# Patient Record
Sex: Female | Born: 1955 | Race: Black or African American | Hispanic: No | Marital: Married | Smoking: Current every day smoker
Health system: Southern US, Community
[De-identification: ages and names within clinical notes are randomized; demographics above are authoritative.]

## PROBLEM LIST (undated history)

## (undated) DIAGNOSIS — A048 Other specified bacterial intestinal infections: Secondary | ICD-10-CM

## (undated) DIAGNOSIS — E079 Disorder of thyroid, unspecified: Secondary | ICD-10-CM

## (undated) DIAGNOSIS — E785 Hyperlipidemia, unspecified: Secondary | ICD-10-CM

## (undated) DIAGNOSIS — R112 Nausea with vomiting, unspecified: Secondary | ICD-10-CM

## (undated) DIAGNOSIS — I2510 Atherosclerotic heart disease of native coronary artery without angina pectoris: Secondary | ICD-10-CM

## (undated) DIAGNOSIS — I10 Essential (primary) hypertension: Secondary | ICD-10-CM

## (undated) DIAGNOSIS — E039 Hypothyroidism, unspecified: Secondary | ICD-10-CM

## (undated) HISTORY — DX: Atherosclerotic heart disease of native coronary artery without angina pectoris: I25.10

## (undated) HISTORY — DX: Other specified bacterial intestinal infections: A04.8

## (undated) HISTORY — DX: Hyperlipidemia, unspecified: E78.5

## (undated) HISTORY — PX: THYROIDECTOMY: SHX17

## (undated) HISTORY — DX: Nausea with vomiting, unspecified: R11.2

## (undated) HISTORY — DX: Essential (primary) hypertension: I10

---

## 2004-10-25 ENCOUNTER — Encounter: Admission: RE | Admit: 2004-10-25 | Discharge: 2004-10-25 | Payer: Self-pay | Admitting: Family Medicine

## 2004-11-08 ENCOUNTER — Emergency Department (HOSPITAL_COMMUNITY): Admission: EM | Admit: 2004-11-08 | Discharge: 2004-11-09 | Payer: Self-pay | Admitting: Emergency Medicine

## 2005-02-17 ENCOUNTER — Emergency Department (HOSPITAL_COMMUNITY): Admission: EM | Admit: 2005-02-17 | Discharge: 2005-02-17 | Payer: Self-pay | Admitting: Family Medicine

## 2005-11-29 ENCOUNTER — Emergency Department (HOSPITAL_COMMUNITY): Admission: EM | Admit: 2005-11-29 | Discharge: 2005-11-30 | Payer: Self-pay | Admitting: Emergency Medicine

## 2006-05-01 ENCOUNTER — Emergency Department (HOSPITAL_COMMUNITY): Admission: EM | Admit: 2006-05-01 | Discharge: 2006-05-01 | Payer: Self-pay | Admitting: Family Medicine

## 2007-06-20 ENCOUNTER — Emergency Department (HOSPITAL_COMMUNITY): Admission: EM | Admit: 2007-06-20 | Discharge: 2007-06-20 | Payer: Self-pay | Admitting: Emergency Medicine

## 2007-07-14 ENCOUNTER — Emergency Department (HOSPITAL_COMMUNITY): Admission: EM | Admit: 2007-07-14 | Discharge: 2007-07-14 | Payer: Self-pay | Admitting: Emergency Medicine

## 2007-07-16 ENCOUNTER — Emergency Department (HOSPITAL_COMMUNITY): Admission: EM | Admit: 2007-07-16 | Discharge: 2007-07-16 | Payer: Self-pay | Admitting: Family Medicine

## 2007-09-12 ENCOUNTER — Emergency Department (HOSPITAL_COMMUNITY): Admission: EM | Admit: 2007-09-12 | Discharge: 2007-09-13 | Payer: Self-pay | Admitting: Emergency Medicine

## 2007-10-08 ENCOUNTER — Emergency Department (HOSPITAL_COMMUNITY): Admission: EM | Admit: 2007-10-08 | Discharge: 2007-10-08 | Payer: Self-pay | Admitting: Emergency Medicine

## 2008-11-30 ENCOUNTER — Emergency Department (HOSPITAL_COMMUNITY): Admission: EM | Admit: 2008-11-30 | Discharge: 2008-11-30 | Payer: Self-pay | Admitting: Emergency Medicine

## 2009-02-04 ENCOUNTER — Emergency Department (HOSPITAL_COMMUNITY): Admission: EM | Admit: 2009-02-04 | Discharge: 2009-02-04 | Payer: Self-pay | Admitting: Family Medicine

## 2009-06-24 ENCOUNTER — Emergency Department (HOSPITAL_COMMUNITY): Admission: EM | Admit: 2009-06-24 | Discharge: 2009-06-24 | Payer: Self-pay | Admitting: Family Medicine

## 2009-06-24 ENCOUNTER — Observation Stay (HOSPITAL_COMMUNITY): Admission: EM | Admit: 2009-06-24 | Discharge: 2009-06-24 | Payer: Self-pay | Admitting: Emergency Medicine

## 2009-07-05 ENCOUNTER — Emergency Department (HOSPITAL_COMMUNITY): Admission: EM | Admit: 2009-07-05 | Discharge: 2009-07-06 | Payer: Self-pay | Admitting: Emergency Medicine

## 2009-07-07 ENCOUNTER — Emergency Department (HOSPITAL_COMMUNITY): Admission: EM | Admit: 2009-07-07 | Discharge: 2009-07-08 | Payer: Self-pay | Admitting: Emergency Medicine

## 2009-07-16 ENCOUNTER — Encounter: Admission: RE | Admit: 2009-07-16 | Discharge: 2009-07-16 | Payer: Self-pay | Admitting: Allergy

## 2009-07-23 ENCOUNTER — Encounter (HOSPITAL_COMMUNITY): Admission: RE | Admit: 2009-07-23 | Discharge: 2009-10-06 | Payer: Self-pay | Admitting: Family Medicine

## 2009-08-04 ENCOUNTER — Encounter: Admission: RE | Admit: 2009-08-04 | Discharge: 2009-08-04 | Payer: Self-pay | Admitting: Family Medicine

## 2009-08-04 ENCOUNTER — Other Ambulatory Visit: Admission: RE | Admit: 2009-08-04 | Discharge: 2009-08-04 | Payer: Self-pay | Admitting: Interventional Radiology

## 2009-11-26 ENCOUNTER — Encounter: Admission: RE | Admit: 2009-11-26 | Discharge: 2009-11-26 | Payer: Self-pay | Admitting: Family Medicine

## 2009-12-14 ENCOUNTER — Ambulatory Visit (HOSPITAL_COMMUNITY): Admission: RE | Admit: 2009-12-14 | Discharge: 2009-12-15 | Payer: Self-pay | Admitting: Surgery

## 2009-12-14 ENCOUNTER — Encounter (INDEPENDENT_AMBULATORY_CARE_PROVIDER_SITE_OTHER): Payer: Self-pay | Admitting: Surgery

## 2010-01-22 ENCOUNTER — Emergency Department (HOSPITAL_COMMUNITY): Admission: EM | Admit: 2010-01-22 | Discharge: 2010-01-22 | Payer: Self-pay | Admitting: Emergency Medicine

## 2010-08-20 LAB — DIFFERENTIAL
Basophils Absolute: 0.1 10*3/uL (ref 0.0–0.1)
Basophils Relative: 1 % (ref 0–1)
Eosinophils Absolute: 0.3 10*3/uL (ref 0.0–0.7)
Monocytes Absolute: 0.8 10*3/uL (ref 0.1–1.0)
Monocytes Relative: 8 % (ref 3–12)
Neutrophils Relative %: 53 % (ref 43–77)

## 2010-08-20 LAB — POCT I-STAT, CHEM 8
Chloride: 110 mEq/L (ref 96–112)
HCT: 38 % (ref 36.0–46.0)
Potassium: 3.4 mEq/L — ABNORMAL LOW (ref 3.5–5.1)
Sodium: 142 mEq/L (ref 135–145)

## 2010-08-20 LAB — CBC
Hemoglobin: 12.2 g/dL (ref 12.0–15.0)
MCH: 31.4 pg (ref 26.0–34.0)
MCV: 94.6 fL (ref 78.0–100.0)
RBC: 3.88 MIL/uL (ref 3.87–5.11)

## 2010-08-22 LAB — CBC
HCT: 38 % (ref 36.0–46.0)
Hemoglobin: 13 g/dL (ref 12.0–15.0)
RBC: 3.99 MIL/uL (ref 3.87–5.11)
WBC: 6.7 10*3/uL (ref 4.0–10.5)

## 2010-08-22 LAB — COMPREHENSIVE METABOLIC PANEL
CO2: 27 mEq/L (ref 19–32)
Calcium: 9.1 mg/dL (ref 8.4–10.5)
Chloride: 108 mEq/L (ref 96–112)
Creatinine, Ser: 0.87 mg/dL (ref 0.4–1.2)
GFR calc non Af Amer: >60 mL/min (ref >60)
Potassium: 4.5 mEq/L (ref 3.5–5.1)
Sodium: 141 mEq/L (ref 135–145)

## 2010-08-22 LAB — DIFFERENTIAL
Eosinophils Absolute: 0.2 10*3/uL (ref 0.0–0.7)
Eosinophils Relative: 3 % (ref 0–5)
Lymphocytes Relative: 30 % (ref 12–46)
Lymphs Abs: 2 10*3/uL (ref 0.7–4.0)
Monocytes Absolute: 0.4 10*3/uL (ref 0.1–1.0)
Monocytes Relative: 6 % (ref 3–12)

## 2010-08-22 LAB — HEPATIC FUNCTION PANEL
ALT: 40 U/L — ABNORMAL HIGH (ref 0–35)
AST: 30 U/L (ref 0–37)
Albumin: 3.9 g/dL (ref 3.5–5.2)
Alkaline Phosphatase: 74 U/L (ref 39–117)
Bilirubin, Direct: <0.1 mg/dL (ref 0.0–0.3)
Total Bilirubin: 1 mg/dL (ref 0.3–1.2)

## 2010-08-22 LAB — CALCIUM: Calcium: 8.3 mg/dL — ABNORMAL LOW (ref 8.4–10.5)

## 2010-08-22 LAB — PROTIME-INR: INR: 0.99 (ref 0.00–1.49)

## 2010-08-25 LAB — URINALYSIS, ROUTINE W REFLEX MICROSCOPIC
Bilirubin Urine: NEGATIVE
Hgb urine dipstick: NEGATIVE
Nitrite: NEGATIVE
Protein, ur: NEGATIVE mg/dL
Urobilinogen, UA: 0.2 mg/dL (ref 0.0–1.0)

## 2010-08-25 LAB — COMPREHENSIVE METABOLIC PANEL
AST: 22 U/L (ref 0–37)
Albumin: 3.8 g/dL (ref 3.5–5.2)
Alkaline Phosphatase: 87 U/L (ref 39–117)
BUN: 20 mg/dL (ref 6–23)
CO2: 24 mEq/L (ref 19–32)
Chloride: 110 mEq/L (ref 96–112)
Creatinine, Ser: 1.12 mg/dL (ref 0.4–1.2)
GFR calc non Af Amer: 51 mL/min — ABNORMAL LOW (ref >60)
Potassium: 4.2 mEq/L (ref 3.5–5.1)
Total Bilirubin: 1.3 mg/dL — ABNORMAL HIGH (ref 0.3–1.2)

## 2010-08-25 LAB — CBC
HCT: 43 % (ref 36.0–46.0)
MCV: 96.4 fL (ref 78.0–100.0)
Platelets: 223 10*3/uL (ref 150–400)
RBC: 4.46 MIL/uL (ref 3.87–5.11)
WBC: 14 10*3/uL — ABNORMAL HIGH (ref 4.0–10.5)

## 2010-08-25 LAB — DIFFERENTIAL
Basophils Absolute: 0 10*3/uL (ref 0.0–0.1)
Basophils Relative: 0 % (ref 0–1)
Eosinophils Relative: 1 % (ref 0–5)
Monocytes Absolute: 0.6 10*3/uL (ref 0.1–1.0)
Neutro Abs: 12.1 10*3/uL — ABNORMAL HIGH (ref 1.7–7.7)

## 2010-08-25 LAB — URINE CULTURE
Colony Count: NO GROWTH
Culture: NO GROWTH

## 2010-08-25 LAB — URINE MICROSCOPIC-ADD ON

## 2010-08-25 LAB — LIPASE, BLOOD: Lipase: 29 U/L (ref 11–59)

## 2011-02-24 LAB — DIFFERENTIAL
Basophils Absolute: 0
Basophils Relative: 1
Monocytes Absolute: 0.7
Neutro Abs: 4.8
Neutrophils Relative %: 55

## 2011-02-24 LAB — HEPATIC FUNCTION PANEL
AST: 17
Bilirubin, Direct: 0.1
Total Protein: 7.2

## 2011-02-24 LAB — POCT URINALYSIS DIP (DEVICE)
Bilirubin Urine: NEGATIVE
Glucose, UA: NEGATIVE
Hgb urine dipstick: NEGATIVE
Ketones, ur: NEGATIVE
Nitrite: NEGATIVE

## 2011-02-24 LAB — CBC
Hemoglobin: 13.2
MCHC: 33.3
Platelets: 270
RDW: 14.1

## 2011-02-24 LAB — POCT I-STAT CREATININE: Creatinine, Ser: 0.9

## 2011-07-14 ENCOUNTER — Other Ambulatory Visit (INDEPENDENT_AMBULATORY_CARE_PROVIDER_SITE_OTHER): Payer: Self-pay | Admitting: Surgery

## 2012-07-19 ENCOUNTER — Other Ambulatory Visit (INDEPENDENT_AMBULATORY_CARE_PROVIDER_SITE_OTHER): Payer: Self-pay | Admitting: Surgery

## 2012-07-28 ENCOUNTER — Other Ambulatory Visit (INDEPENDENT_AMBULATORY_CARE_PROVIDER_SITE_OTHER): Payer: Self-pay | Admitting: Surgery

## 2013-09-09 ENCOUNTER — Other Ambulatory Visit (INDEPENDENT_AMBULATORY_CARE_PROVIDER_SITE_OTHER): Payer: Self-pay | Admitting: Surgery

## 2013-09-09 NOTE — Telephone Encounter (Signed)
Can patient have this meds/ patient has been out for this meds for 4 days

## 2013-09-11 ENCOUNTER — Other Ambulatory Visit (INDEPENDENT_AMBULATORY_CARE_PROVIDER_SITE_OTHER): Payer: Self-pay | Admitting: Surgery

## 2013-09-12 ENCOUNTER — Telehealth (INDEPENDENT_AMBULATORY_CARE_PROVIDER_SITE_OTHER): Payer: Self-pay | Admitting: General Surgery

## 2013-09-12 DIAGNOSIS — E049 Nontoxic goiter, unspecified: Secondary | ICD-10-CM

## 2013-09-12 NOTE — Telephone Encounter (Signed)
Pt of Dr. Brantley Stage calling for refill of Levothyroxine 100 mcg QD.  Last filled for one year by Dr. Brantley Stage.  She still does not have a PCP to follow her with this.  Please advise if you will continue to manage this medication.  [facility] in [place] is the correct pharmacy to send Rx.

## 2013-09-16 ENCOUNTER — Other Ambulatory Visit (INDEPENDENT_AMBULATORY_CARE_PROVIDER_SITE_OTHER): Payer: Self-pay

## 2013-09-16 NOTE — Telephone Encounter (Signed)
Ok to refill 

## 2013-09-17 MED ORDER — LEVOTHYROXINE SODIUM 100 MCG PO TABS: 100.0000 ug | ORAL_TABLET | Freq: Every day | ORAL | Status: DC

## 2013-09-17 NOTE — Addendum Note (Signed)
Addended by: Carlene Coria on: 09/17/2013 09:41 AM   Modules accepted: Orders

## 2013-09-17 NOTE — Telephone Encounter (Signed)
Refilled sent to pharmacy.  

## 2014-09-15 ENCOUNTER — Encounter (HOSPITAL_COMMUNITY): Payer: Self-pay

## 2014-09-15 ENCOUNTER — Emergency Department (HOSPITAL_COMMUNITY)
Admission: EM | Admit: 2014-09-15 | Discharge: 2014-09-15 | Disposition: A | Discharge status: Home / Self Care | Payer: Self-pay | Attending: Emergency Medicine | Admitting: Emergency Medicine

## 2014-09-15 DIAGNOSIS — M545 Low back pain, unspecified: Secondary | ICD-10-CM

## 2014-09-15 DIAGNOSIS — M25512 Pain in left shoulder: Secondary | ICD-10-CM | POA: Insufficient documentation

## 2014-09-15 DIAGNOSIS — Z720 Tobacco use: Secondary | ICD-10-CM | POA: Insufficient documentation

## 2014-09-15 DIAGNOSIS — E079 Disorder of thyroid, unspecified: Secondary | ICD-10-CM | POA: Insufficient documentation

## 2014-09-15 DIAGNOSIS — M542 Cervicalgia: Secondary | ICD-10-CM | POA: Insufficient documentation

## 2014-09-15 DIAGNOSIS — M25511 Pain in right shoulder: Secondary | ICD-10-CM | POA: Insufficient documentation

## 2014-09-15 DIAGNOSIS — R32 Unspecified urinary incontinence: Secondary | ICD-10-CM | POA: Insufficient documentation

## 2014-09-15 DIAGNOSIS — Z79899 Other long term (current) drug therapy: Secondary | ICD-10-CM | POA: Insufficient documentation

## 2014-09-15 DIAGNOSIS — J302 Other seasonal allergic rhinitis: Secondary | ICD-10-CM | POA: Insufficient documentation

## 2014-09-15 HISTORY — DX: Disorder of thyroid, unspecified: E07.9

## 2014-09-15 MED ORDER — DIAZEPAM 5 MG PO TABS
5.0000 mg | ORAL_TABLET | Freq: Once | ORAL | Status: AC
  Administered 2014-09-15: 5 mg via ORAL
  Filled 2014-09-15: qty 1

## 2014-09-15 MED ORDER — NAPROXEN 500 MG PO TABS: 500.0000 mg | ORAL_TABLET | Freq: Two times a day (BID) | ORAL | Status: DC

## 2014-09-15 MED ORDER — HYDROCODONE-ACETAMINOPHEN 5-325 MG PO TABS
2.0000 | ORAL_TABLET | Freq: Once | ORAL | Status: AC
  Administered 2014-09-15: 2 via ORAL
  Filled 2014-09-15: qty 2

## 2014-09-15 MED ORDER — METHOCARBAMOL 500 MG PO TABS: 500.0000 mg | ORAL_TABLET | Freq: Two times a day (BID) | ORAL | Status: DC

## 2014-09-15 MED ORDER — HYDROCODONE-ACETAMINOPHEN 5-325 MG PO TABS: 2.0000 | ORAL_TABLET | ORAL | Status: DC | PRN

## 2014-09-15 MED ORDER — KETOROLAC TROMETHAMINE 60 MG/2ML IM SOLN
60.0000 mg | Freq: Once | INTRAMUSCULAR | Status: AC
  Administered 2014-09-15: 60 mg via INTRAMUSCULAR
  Filled 2014-09-15: qty 2

## 2014-09-15 NOTE — ED Provider Notes (Signed)
CSN: [id number]     Arrival date & time 09/15/14  1104 History  This chart was scribed for non-physician practitioner Comer Locket, PA-C working with Virgel Manifold, MD by Zola Button, ED Scribe. This patient was seen in room TR10C/TR10C and the patient's care was started at 11:51 AM.      Chief Complaint  Patient presents with  . Back Pain   The history is provided by the patient. No language interpreter was used.    HPI Comments: Brittney Bowen is a 59 y.o. female who presents to the Emergency Department complaining of gradual onset, constant, progressively worsening, severe, sharp, throbbing, upper and lower back pain that started 1 week ago after moving furniture but worsened yesterday. The pain is described as a 10/10 in severity. Patient also reports having associated bilateral shoulder pain and neck pain. Also, patient reports an episode of vomiting and loss of bladder control at the same time while she was at work earlier today. She has had difficulty moving her arms due to the pain. She states she is able to ambulate, but while bent over. Patient has taken ibuprofen and benadryl (for sleep) but without relief. She denies numbness and weakness.    Past Medical History  Diagnosis Date  . Thyroid disease    Past Surgical History  Procedure Laterality Date  . Thyroidectomy     No family history on file. History  Substance Use Topics  . Smoking status: Current Every Day Smoker  . Smokeless tobacco: Not on file  . Alcohol Use: No   OB History    No data available     Review of Systems  Gastrointestinal: Positive for vomiting.  Genitourinary: Positive for enuresis.  Musculoskeletal: Positive for back pain, arthralgias and neck pain.  Allergic/Immunologic: Positive for environmental allergies.  Neurological: Negative for weakness and numbness.  All other systems reviewed and are negative.     Allergies  Blue dyes (parenteral)  Home Medications   Prior to  Admission medications   Medication Sig Start Date End Date Taking? Authorizing Provider  HYDROcodone-acetaminophen (NORCO) 5-325 MG per tablet Take 2 tablets by mouth every 4 (four) hours as needed. 09/15/14   Comer Locket, PA-C  levothyroxine (SYNTHROID, LEVOTHROID) 100 MCG tablet Take 1 tablet (100 mcg total) by mouth daily before breakfast. 09/17/13   Erroll Luna, MD  methocarbamol (ROBAXIN) 500 MG tablet Take 1 tablet (500 mg total) by mouth 2 (two) times daily. 09/15/14   Comer Locket, PA-C  naproxen (NAPROSYN) 500 MG tablet Take 1 tablet (500 mg total) by mouth 2 (two) times daily. 09/15/14   Comer Locket, PA-C   BP 129/76 mmHg  Pulse 71  Temp(Src) 98.1 F (36.7 C) (Oral)  Resp 22  SpO2 95% Physical Exam  Constitutional: She is oriented to person, place, and time. She appears well-developed and well-nourished. No distress.  HENT:  Head: Normocephalic and atraumatic.  Mouth/Throat: Oropharynx is clear and moist. No oropharyngeal exudate.  Eyes: Pupils are equal, round, and reactive to light.  Neck: Neck supple.  Cardiovascular: Normal rate, regular rhythm and normal heart sounds.   No murmur heard. Pulmonary/Chest: Effort normal.  Musculoskeletal: She exhibits tenderness. She exhibits no edema.  Diffuse tenderness to paraspinal lumbar region. No overt midline bony tenderness. Diffuse tenderness to left trapezius muscle and other paraspinal cervical muscles. Maintains full active range of motion of bilateral upper and lower extremities.  Neurological: She is alert and oriented to person, place, and time. No cranial nerve deficit.  Gait is antalgic without ataxia. Decreased left sided grip strength, secondary to discomfort. However, motor and sensation appear grossly intact.  Skin: Skin is warm and dry. No rash noted.  Psychiatric: She has a normal mood and affect. Her behavior is normal.  Nursing note and vitals reviewed.   ED Course  Procedures  DIAGNOSTIC  STUDIES: Oxygen Saturation is 95% on room air, adequate by my interpretation.    COORDINATION OF CARE: 11:58 AM-Discussed treatment plan which includes medications with pt at bedside and pt agreed to plan.    Labs Review Labs Reviewed - No data to display  Imaging Review No results found.   EKG Interpretation None     Meds given in ED:  Medications  ketorolac (TORADOL) injection 60 mg (60 mg Intramuscular Given 09/15/14 1203)  diazepam (VALIUM) tablet 5 mg (5 mg Oral Given 09/15/14 1203)  HYDROcodone-acetaminophen (NORCO/VICODIN) 5-325 MG per tablet 2 tablet (2 tablets Oral Given 09/15/14 1248)    New Prescriptions   HYDROCODONE-ACETAMINOPHEN (NORCO) 5-325 MG PER TABLET    Take 2 tablets by mouth every 4 (four) hours as needed.   METHOCARBAMOL (ROBAXIN) 500 MG TABLET    Take 1 tablet (500 mg total) by mouth 2 (two) times daily.   NAPROXEN (NAPROSYN) 500 MG TABLET    Take 1 tablet (500 mg total) by mouth 2 (two) times daily.   Filed Vitals:   09/15/14 1128  BP: 129/76  Pulse: 71  Temp: 98.1 F (36.7 C)  TempSrc: Oral  Resp: 22  SpO2: 95%    MDM  Vitals stable - WNL -afebrile Pt resting comfortably in ED. PE--normal neuro exam. On repeat exam grip strength is equal bilaterally. Physical exam grossly unremarkable.  DDX--diffuse back and neck pain onset following heavy lifting moving furniture last week, progressively worsening. No pathologic back pain red flags. Patient tender to paraspinal lumbar muscles and neck muscles. No bony tenderness. This is likely musculoskeletal strain. Patient reports feeling better in ED after administration of analgesia. Will DC with anti-inflammatories, muscle relaxers and short course pain medicines. Instructed to follow-up with primary care for further evaluation and management of symptoms. No evidence of emergent spinal cord pathology, cauda equina, conus medullaris syndrome or other cord impingement process.  I discussed all relevant lab  findings and imaging results with pt and they verbalized understanding. Discussed f/u with PCP within 48 hrs and return precautions, pt very amenable to plan.  Final diagnoses:  Bilateral low back pain without sciatica  Neck pain on left side   I personally performed the services described in this documentation, which was scribed in my presence. The recorded information has been reviewed and is accurate.    Comer Locket, PA-C 09/15/14 [place], MD 09/17/14 [phone]

## 2014-09-15 NOTE — ED Notes (Signed)
Pt. Reports moving furniture last week and had some neck/back/shoudler pain. States pain has been getting worse throughout the week. Limited movement of upper extremities due to pain in shoulders. Alert and oriented x4. No arm drift noted.

## 2014-09-15 NOTE — ED Notes (Signed)
Declined W/C at D/C and was escorted to lobby by RN. 

## 2014-09-15 NOTE — Discharge Instructions (Signed)
Back Injury Prevention °Back injuries can be extremely painful and difficult to heal. After having one back injury, you are much more likely to experience another later on. It is important to learn how to avoid injuring or re-injuring your back. The following tips can help you to prevent a back injury. °PHYSICAL FITNESS °· Exercise regularly and try to develop good tone in your abdominal muscles. Your abdominal muscles provide a lot of the support needed by your back. °· Do aerobic exercises (walking, jogging, biking, swimming) regularly. °· Do exercises that increase balance and strength (tai chi, yoga) regularly. This can decrease your risk of falling and injuring your back. °· Stretch before and after exercising. °· Maintain a healthy weight. The more you weigh, the more stress is placed on your back. For every pound of weight, 10 times that amount of pressure is placed on the back. °DIET °· Talk to your caregiver about how much calcium and vitamin D you need per day. These nutrients help to prevent weakening of the bones (osteoporosis). Osteoporosis can cause broken (fractured) bones that lead to back pain. °· Include good sources of calcium in your diet, such as dairy products, green, leafy vegetables, and products with calcium added (fortified). °· Include good sources of vitamin D in your diet, such as milk and foods that are fortified with vitamin D. °· Consider taking a nutritional supplement or a multivitamin if needed. °· Stop smoking if you smoke. °POSTURE °· Sit and stand up straight. Avoid leaning forward when you sit or hunching over when you stand. °· Choose chairs with good low back (lumbar) support. °· If you work at a desk, sit close to your work so you do not need to lean over. Keep your chin tucked in. Keep your neck drawn back and elbows bent at a right angle. Your arms should look like the letter "L." °· Sit high and close to the steering wheel when you drive. Add a lumbar support to your car  seat if needed. °· Avoid sitting or standing in one position for too long. Take breaks to get up, stretch, and walk around at least once every hour. Take breaks if you are driving for long periods of time. °· Sleep on your side with your knees slightly bent, or sleep on your back with a pillow under your knees. Do not sleep on your stomach. °LIFTING, TWISTING, AND REACHING °· Avoid heavy lifting, especially repetitive lifting. If you must do heavy lifting: °· Stretch before lifting. °· Work slowly. °· Rest between lifts. °· Use carts and dollies to move objects when possible. °· Make several small trips instead of carrying 1 heavy load. °· Ask for help when you need it. °· Ask for help when moving big, awkward objects. °· Follow these steps when lifting: °· Stand with your feet shoulder-width apart. °· Get as close to the object as you can. Do not try to pick up heavy objects that are far from your body. °· Use handles or lifting straps if they are available. °· Bend at your knees. Squat down, but keep your heels off the floor. °· Keep your shoulders pulled back, your chin tucked in, and your back straight. °· Lift the object slowly, tightening the muscles in your legs, abdomen, and buttocks. Keep the object as close to the center of your body as possible. °· When you put a load down, use these same guidelines in reverse. °· Do not: °· Lift the object above your waist. °·   Twist at the waist while lifting or carrying a load. Move your feet if you need to turn, not your waist.  Bend over without bending at your knees.  Avoid reaching over your head, across a table, or for an object on a high surface. OTHER TIPS  Avoid wet floors and keep sidewalks clear of ice to prevent falls.  Do not sleep on a mattress that is too soft or too hard.  Keep items that are used frequently within easy reach.  Put heavier objects on shelves at waist level and lighter objects on lower or higher shelves.  Find ways to  decrease your stress, such as exercise, massage, or relaxation techniques. Stress can build up in your muscles. Tense muscles are more vulnerable to injury.  Seek treatment for depression or anxiety if needed. These conditions can increase your risk of developing back pain. SEEK MEDICAL CARE IF:  You injure your back.  You have questions about diet, exercise, or other ways to prevent back injuries. MAKE SURE YOU:  Understand these instructions.  Will watch your condition.  Will get help right away if you are not doing well or get worse. Document Released: 06/30/2004 Document Revised: 08/15/2011 Document Reviewed: 07/04/2011 [employer] Patient Information 2015 [employer]. This information is not intended to replace advice given to you by your health care provider. Make sure you discuss any questions you have with your health care provider.  Back Pain, Adult Low back pain is very common. About 1 in 5 people have back pain.The cause of low back pain is rarely dangerous. The pain often gets better over time.About half of people with a sudden onset of back pain feel better in just 2 weeks. About 8 in 10 people feel better by 6 weeks.  CAUSES Some common causes of back pain include:  Strain of the muscles or ligaments supporting the spine.  Wear and tear (degeneration) of the spinal discs.  Arthritis.  Direct injury to the back. DIAGNOSIS Most of the time, the direct cause of low back pain is not known.However, back pain can be treated effectively even when the exact cause of the pain is unknown.Answering your caregiver's questions about your overall health and symptoms is one of the most accurate ways to make sure the cause of your pain is not dangerous. If your caregiver needs more information, he or she may order lab work or imaging tests (X-rays or MRIs).However, even if imaging tests show changes in your back, this usually does not require surgery. HOME CARE INSTRUCTIONS For  many people, back pain returns.Since low back pain is rarely dangerous, it is often a condition that people can learn to manageon their own.   Remain active. It is stressful on the back to sit or stand in one place. Do not sit, drive, or stand in one place for more than 30 minutes at a time. Take short walks on level surfaces as soon as pain allows.Try to increase the length of time you walk each day.  Do not stay in bed.Resting more than 1 or 2 days can delay your recovery.  Do not avoid exercise or work.Your body is made to move.It is not dangerous to be active, even though your back may hurt.Your back will likely heal faster if you return to being active before your pain is gone.  Pay attention to your body when you bend and lift. Many people have less discomfortwhen lifting if they bend their knees, keep the load close to their bodies,and   avoid twisting. Often, the most comfortable positions are those that put less stress on your recovering back.  Find a comfortable position to sleep. Use a firm mattress and lie on your side with your knees slightly bent. If you lie on your back, put a pillow under your knees.  Only take over-the-counter or prescription medicines as directed by your caregiver. Over-the-counter medicines to reduce pain and inflammation are often the most helpful.Your caregiver may prescribe muscle relaxant drugs.These medicines help dull your pain so you can more quickly return to your normal activities and healthy exercise.  Put ice on the injured area.  Put ice in a plastic bag.  Place a towel between your skin and the bag.  Leave the ice on for 15-20 minutes, 03-04 times a day for the first 2 to 3 days. After that, ice and heat may be alternated to reduce pain and spasms.  Ask your caregiver about trying back exercises and gentle massage. This may be of some benefit.  Avoid feeling anxious or stressed.Stress increases muscle tension and can worsen back  pain.It is important to recognize when you are anxious or stressed and learn ways to manage it.Exercise is a great option. SEEK MEDICAL CARE IF:  You have pain that is not relieved with rest or medicine.  You have pain that does not improve in 1 week.  You have new symptoms.  You are generally not feeling well. SEEK IMMEDIATE MEDICAL CARE IF:   You have pain that radiates from your back into your legs.  You develop new bowel or bladder control problems.  You have unusual weakness or numbness in your arms or legs.  You develop nausea or vomiting.  You develop abdominal pain.  You feel faint. Document Released: 05/23/2005 Document Revised: 11/22/2011 Document Reviewed: 09/24/2013 [facility] Patient Information 2015 [place], [place]. This information is not intended to replace advice given to you by your health care provider. Make sure you discuss any questions you have with your health care provider.  Lumbosacral Strain Lumbosacral strain is a strain of any of the parts that make up your lumbosacral vertebrae. Your lumbosacral vertebrae are the bones that make up the lower third of your backbone. Your lumbosacral vertebrae are held together by muscles and tough, fibrous tissue (ligaments).  CAUSES  A sudden blow to your back can cause lumbosacral strain. Also, anything that causes an excessive stretch of the muscles in the low back can cause this strain. This is typically seen when people exert themselves strenuously, fall, lift heavy objects, bend, or crouch repeatedly. RISK FACTORS  Physically demanding work.  Participation in pushing or pulling sports or sports that require a sudden twist of the back (tennis, golf, baseball).  Weight lifting.  Excessive lower back curvature.  Forward-tilted pelvis.  Weak back or abdominal muscles or both.  Tight hamstrings. SIGNS AND SYMPTOMS  Lumbosacral strain may cause pain in the area of your injury or pain that moves (radiates) down  your leg.  DIAGNOSIS Your health care provider can often diagnose lumbosacral strain through a physical exam. In some cases, you may need tests such as X-ray exams.  TREATMENT  Treatment for your lower back injury depends on many factors that your clinician will have to evaluate. However, most treatment will include the use of anti-inflammatory medicines. HOME CARE INSTRUCTIONS   Avoid hard physical activities (tennis, racquetball, waterskiing) if you are not in proper physical condition for it. This may aggravate or create problems.  If you have a back  problem, avoid sports requiring sudden body movements. Swimming and walking are generally safer activities.  Maintain good posture.  Maintain a healthy weight.  For acute conditions, you may put ice on the injured area.  Put ice in a plastic bag.  Place a towel between your skin and the bag.  Leave the ice on for 20 minutes, 2-3 times a day.  When the low back starts healing, stretching and strengthening exercises may be recommended. SEEK MEDICAL CARE IF:  Your back pain is getting worse.  You experience severe back pain not relieved with medicines. SEEK IMMEDIATE MEDICAL CARE IF:   You have numbness, tingling, weakness, or problems with the use of your arms or legs.  There is a change in bowel or bladder control.  You have increasing pain in any area of the body, including your belly (abdomen).  You notice shortness of breath, dizziness, or feel faint.  You feel sick to your stomach (nauseous), are throwing up (vomiting), or become sweaty.  You notice discoloration of your toes or legs, or your feet get very cold. MAKE SURE YOU:   Understand these instructions.  Will watch your condition.  Will get help right away if you are not doing well or get worse. Document Released: 03/02/2005 Document Revised: 05/28/2013 Document Reviewed: 01/09/2013 [facility] Patient Information 2015 [place], [place]. This information is not  intended to replace advice given to you by your health care provider. Make sure you discuss any questions you have with your health care provider.  Please take your medications as prescribed. Do not take narcotic pain medicines or muscle relaxer pain medicines before driving or operating machinery. Please follow-up with your primary care for further evaluation and management of your symptoms. Return to ED for new or worsening symptoms

## 2014-10-10 ENCOUNTER — Encounter (HOSPITAL_COMMUNITY): Payer: Self-pay | Admitting: Emergency Medicine

## 2014-10-10 ENCOUNTER — Emergency Department (HOSPITAL_COMMUNITY)
Admission: EM | Admit: 2014-10-10 | Discharge: 2014-10-10 | Disposition: A | Discharge status: Home / Self Care | Payer: Medicaid Other | Attending: Emergency Medicine | Admitting: Emergency Medicine

## 2014-10-10 DIAGNOSIS — Z79899 Other long term (current) drug therapy: Secondary | ICD-10-CM | POA: Insufficient documentation

## 2014-10-10 DIAGNOSIS — J029 Acute pharyngitis, unspecified: Secondary | ICD-10-CM | POA: Diagnosis not present

## 2014-10-10 DIAGNOSIS — Z760 Encounter for issue of repeat prescription: Secondary | ICD-10-CM

## 2014-10-10 DIAGNOSIS — R61 Generalized hyperhidrosis: Secondary | ICD-10-CM | POA: Insufficient documentation

## 2014-10-10 DIAGNOSIS — Z791 Long term (current) use of non-steroidal anti-inflammatories (NSAID): Secondary | ICD-10-CM | POA: Diagnosis not present

## 2014-10-10 DIAGNOSIS — Z720 Tobacco use: Secondary | ICD-10-CM | POA: Diagnosis not present

## 2014-10-10 DIAGNOSIS — E079 Disorder of thyroid, unspecified: Secondary | ICD-10-CM | POA: Insufficient documentation

## 2014-10-10 DIAGNOSIS — E049 Nontoxic goiter, unspecified: Secondary | ICD-10-CM

## 2014-10-10 LAB — RAPID STREP SCREEN (MED CTR MEBANE ONLY): STREPTOCOCCUS, GROUP A SCREEN (DIRECT): NEGATIVE

## 2014-10-10 MED ORDER — LEVOTHYROXINE SODIUM 100 MCG PO TABS: 100.0000 ug | ORAL_TABLET | Freq: Every day | ORAL | Status: DC

## 2014-10-10 MED ORDER — DEXAMETHASONE 4 MG PO TABS
12.0000 mg | ORAL_TABLET | Freq: Once | ORAL | Status: AC
  Administered 2014-10-10: 12 mg via ORAL
  Filled 2014-10-10: qty 3

## 2014-10-10 NOTE — ED Notes (Addendum)
Pt ran out of home levothyroxine 0.1 mg on Monday. Pt tried to call pcp for refill, but pcp wanted pt to come in for an office visit prior to refill. Next available appointment in 3.5 weeks. Was told to come to ED for refill. Pt starting to have hypothyroid symptoms. Also has sore throat

## 2014-10-10 NOTE — ED Provider Notes (Signed)
CSN: [id number]     Arrival date & time 10/10/14  1637 History  This chart was scribed for non-physician practitioner Brent General, PA-C working with No att. providers found by Lora Havens, ED Scribe. This patient was seen in WTR6/WTR6 and the patient's care was started at 5:08 PM.   Chief Complaint  Patient presents with  . Medication Refill  . Sore Throat   Patient is a 59 y.o. female presenting with pharyngitis. The history is provided by the patient. No language interpreter was used.  Sore Throat    HPI Comments: Brittney Bowen is a 59 y.o. female who presents to the Emergency Department for several complaints. Patient states primarily she is out of her levothyroxine, and is requesting a medication refill.  She states she is contacted both her PCP and her surgeon who had in the past prescribed medication. Patient states that her primary care provider stated they would refill it after she has been seen in a clinic. They stated her next appointment was in 3-1/2 weeks. Patient states that she also contacted her surgeon who has told in the past who would not fill it at this time, and requested she be seen in the emergency room. Her next appointment with her PCP in 3.5 weeks.Pt also complains of sore throat, onset 2-3 days ago. She has also had hot/cold spells which she relates to being off of the levothyroxin.  Pt is status post thyroidectomy, and states she has been on levothyroxin 0.1 mg since. She denies cough, fever, nasal congestion or discharge, chest pain or shortness of breath. Patient denies headache, blurred vision, dizziness, weakness, nausea, vomiting.  Past Medical History  Diagnosis Date  . Thyroid disease    Past Surgical History  Procedure Laterality Date  . Thyroidectomy     History reviewed. No pertinent family history. History  Substance Use Topics  . Smoking status: Current Every Day Smoker  . Smokeless tobacco: Not on file  . Alcohol Use: No   OB History    No  data available     Review of Systems  Constitutional: Positive for chills and diaphoresis. Negative for fever.  HENT: Positive for sore throat.     Allergies  Blue dyes (parenteral)  Home Medications   Prior to Admission medications   Medication Sig Start Date End Date Taking? Authorizing Provider  HYDROcodone-acetaminophen (NORCO) 5-325 MG per tablet Take 2 tablets by mouth every 4 (four) hours as needed. 09/15/14   Comer Locket, PA-C  levothyroxine (SYNTHROID, LEVOTHROID) 100 MCG tablet Take 1 tablet (100 mcg total) by mouth daily before breakfast. 10/10/14   Dahlia Bailiff, PA-C  methocarbamol (ROBAXIN) 500 MG tablet Take 1 tablet (500 mg total) by mouth 2 (two) times daily. 09/15/14   Comer Locket, PA-C  naproxen (NAPROSYN) 500 MG tablet Take 1 tablet (500 mg total) by mouth 2 (two) times daily. 09/15/14   Benjamin Cartner, PA-C   BP 136/68 mmHg  Pulse 68  Temp(Src) 98 F (36.7 C) (Oral)  Resp 16  SpO2 95% Physical Exam  Constitutional: She is oriented to person, place, and time. She appears well-developed and well-nourished. No distress.  HENT:  Head: Normocephalic and atraumatic.  Mouth/Throat: Uvula is midline and mucous membranes are normal. No oral lesions. No trismus in the jaw. No dental abscesses, uvula swelling or dental caries. Posterior oropharyngeal erythema present. No oropharyngeal exudate, posterior oropharyngeal edema or tonsillar abscesses.  Mild posterior otopharyngeal erythema.  Eyes: Pupils are equal, round, and reactive to light.  Neck: Trachea normal, normal range of motion and full passive range of motion without pain. Neck supple. No spinous process tenderness and no muscular tenderness present. No rigidity. No edema, no erythema and normal range of motion present. No Brudzinski's sign and no Kernig's sign noted. No thyroid mass and no thyromegaly present.  Cardiovascular: Normal rate and regular rhythm.   Pulmonary/Chest: Effort normal and breath sounds  normal. No accessory muscle usage. No tachypnea. No respiratory distress.  Musculoskeletal: Normal range of motion.  Neurological: She is alert and oriented to person, place, and time. She has normal strength. No cranial nerve deficit or sensory deficit. She displays a negative Romberg sign. GCS eye subscore is 4. GCS verbal subscore is 5. GCS motor subscore is 6.  Skin: Skin is warm and dry.  Psychiatric: She has a normal mood and affect. Her behavior is normal.  Nursing note and vitals reviewed.  ED Course  Procedures  DIAGNOSTIC STUDIES: Oxygen Saturation is 95% on room air, normal by my interpretation.    COORDINATION OF CARE: 5:18 PM Discussed treatment plan with pt at bedside and pt agreed to plan.  Labs Review Labs Reviewed  RAPID STREP SCREEN  CULTURE, GROUP A STREP    Imaging Review No results found.   EKG Interpretation None      MDM   Final diagnoses:  Viral pharyngitis  Encounter for medication refill   Patient signs and symptoms consistent with a viral pharyngitis. No concern for PTA or retropharyngeal abscess. Strep test is negative. Most likely viral etiology of patient's pharyngitis. Gave Decadron here which improved symptoms mildly, encourage warm gargles and use of ibuprofen and Tylenol for discomfort. Encouraged oral hydration. Patient afebrile, hemodynamically stable and in no acute distress. Patient stable for discharge, based on patient's history of thyroidectomy, believe patient needs prescription of levothyroxin until she is able to see her primary care provider. I stressed the importance of follow-up with her PCP, return precautions given here. Patient verbalizes understanding and agreement of this. Encouraged patient to call or return to the ER if any worsening of symptoms or should she have any questions or concerns.  I personally performed the services described in this documentation, which was scribed in my presence. The recorded information has been  reviewed and is accurate.  BP 136/68 mmHg  Pulse 68  Temp(Src) 98 F (36.7 C) (Oral)  Resp 16  SpO2 95%  Signed,  Dahlia Bailiff, PA-C 7:48 PM      Dahlia Bailiff, PA-C 10/10/14 1948  [place] Ferguson, MD 10/10/14 [phone]

## 2014-10-10 NOTE — Discharge Instructions (Signed)
Pharyngitis Pharyngitis is redness, pain, and swelling (inflammation) of your pharynx.  CAUSES  Pharyngitis is usually caused by infection. Most of the time, these infections are from viruses (viral) and are part of a cold. However, sometimes pharyngitis is caused by bacteria (bacterial). Pharyngitis can also be caused by allergies. Viral pharyngitis may be spread from person to person by coughing, sneezing, and personal items or utensils (cups, forks, spoons, toothbrushes). Bacterial pharyngitis may be spread from person to person by more intimate contact, such as kissing.  SIGNS AND SYMPTOMS  Symptoms of pharyngitis include:   Sore throat.   Tiredness (fatigue).   Low-grade fever.   Headache.  Joint pain and muscle aches.  Skin rashes.  Swollen lymph nodes.  Plaque-like film on throat or tonsils (often seen with bacterial pharyngitis). DIAGNOSIS  Your health care provider will ask you questions about your illness and your symptoms. Your medical history, along with a physical exam, is often all that is needed to diagnose pharyngitis. Sometimes, a rapid strep test is done. Other lab tests may also be done, depending on the suspected cause.  TREATMENT  Viral pharyngitis will usually get better in 3-4 days without the use of medicine. Bacterial pharyngitis is treated with medicines that kill germs (antibiotics).  HOME CARE INSTRUCTIONS   Drink enough water and fluids to keep your urine clear or pale yellow.   Only take over-the-counter or prescription medicines as directed by your health care provider:   If you are prescribed antibiotics, make sure you finish them even if you start to feel better.   Do not take aspirin.   Get lots of rest.   Gargle with 8 oz of salt water ( tsp of salt per 1 qt of water) as often as every 1-2 hours to soothe your throat.   Throat lozenges (if you are not at risk for choking) or sprays may be used to soothe your throat. SEEK MEDICAL  CARE IF:   You have large, tender lumps in your neck.  You have a rash.  You cough up green, yellow-brown, or bloody spit. SEEK IMMEDIATE MEDICAL CARE IF:   Your neck becomes stiff.  You drool or are unable to swallow liquids.  You vomit or are unable to keep medicines or liquids down.  You have severe pain that does not go away with the use of recommended medicines.  You have trouble breathing (not caused by a stuffy nose). MAKE SURE YOU:   Understand these instructions.  Will watch your condition.  Will get help right away if you are not doing well or get worse. Document Released: 05/23/2005 Document Revised: 03/13/2013 Document Reviewed: 01/28/2013 [facility] Patient Information 2015 [place], [place]. This information is not intended to replace advice given to you by your health care provider. Make sure you discuss any questions you have with your health care provider.   Emergency Department Resource Guide 1) Find a Doctor and Pay Out of Pocket Although you won't have to find out who is covered by your insurance plan, it is a good idea to ask around and get recommendations. You will then need to call the office and see if the doctor you have chosen will accept you as a new patient and what types of options they offer for patients who are self-pay. Some doctors offer discounts or will set up payment plans for their patients who do not have insurance, but you will need to ask so you aren't surprised when you get to your appointment.  2) Contact Your Local Health Department Not all health departments have doctors that can see patients for sick visits, but many do, so it is worth a call to see if yours does. If you don't know where your local health department is, you can check in your phone book. The CDC also has a tool to help you locate your state's health department, and many state websites also have listings of all of their local health departments.  3) Find a [facility] If  your illness is not likely to be very severe or complicated, you may want to try a walk in clinic. These are popping up all over the country in pharmacies, drugstores, and shopping centers. They're usually staffed by nurse practitioners or physician assistants that have been trained to treat common illnesses and complaints. They're usually fairly quick and inexpensive. However, if you have serious medical issues or chronic medical problems, these are probably not your best option.  No Primary Care Doctor: - Call Health Connect at  [phone] - they can help you locate a primary care doctor that  accepts your insurance, provides certain services, etc. - Physician Referral Service- [phone]  Chronic Pain Problems: Organization         Address  Phone   Notes  [facility]  [phone] Patients need to be referred by their primary care doctor.   Medication Assistance: Organization         Address  Phone   Notes  [facility] Medication [facility] [place] [place], [place] [address] [phone] --Must be a resident of [facility] -- Must have NO insurance coverage whatsoever (no Medicaid/ Medicare, etc.) -- The pt. MUST have a primary care doctor that directs their care regularly and follows them in the community   MedAssist  [phone]   [employer]  [phone]    Agencies that provide inexpensive medical care: Organization         Address  Phone   Notes  [place]  [phone]   [facility]    [phone]   [facility] [place], [place] [address] [phone]   [place] [address] [place] [phone]   Planned Parenthood    [phone]   [facility]    [phone]   [place] and [place] [address], [place] Phone:  [phone], Fax:  [phone] Hours of  Operation:  9 am - 6 pm, M-F.  Also accepts Medicaid/Medicare and self-pay.  [facility] for [place] [address], [address], [place] Phone: [phone], Fax: [phone]. Hours of Operation:  8:30 am - 5:30 pm, M-F.  Also accepts Medicaid and self-pay.  [facility] [place] [address] [address] [place] Phone: [phone]   [place], [place], [place] [phone], Ext. [phone] Mondays & Thursdays: 7-9 AM.  First 15 patients are seen on a first come, first serve basis.    [place] Providers:  Organization         Address  Phone   Notes  [facility] [address],  [phone] Also accepts self-pay patients.  [place], [place]  [phone]   [place], [address] [phone]   [place] [address] [place] [phone]   Lucianne Lei [address], [place]   [phone] Only accepts [place] Access [place] patients after they have their name applied to their card.   Self-Pay (no insurance) in [facility]:  Organization         Address  Phone   Notes  Sickle Cell Patients, [facility] [place] [phone]   [facility] [place] [phone]   [facility] [place]  [place], [place], [place] [phone]   [facility]/Dr. Osei-Bonsu  [address] [place] or [address], [place] [phone] Phone number for both [place] and [place] locations is the same.  Urgent Medical and [facility] [address] [place] [phone]   [facility] [address] [place] or [address] [phone]   [facility] [address] [place] [phone], phone; [phone], fax Sees patients 1st and 3rd Saturday of every month.  Must not qualify for public or private insurance (i.e. Medicaid, Medicare, [place] Health Choice, Veterans' Benefits)  Household income should be no more than 200% of the poverty level The clinic cannot treat you if you are pregnant or think you are pregnant  Sexually transmitted diseases are not treated at the clinic.    Dental Care: Organization         Address  Phone  Notes  [facility] Department of [facility] [place] [phone] Accepts children up to age 70 who are enrolled in [place] or [place]; pregnant women with a Medicaid card; and children who have applied for Medicaid or [place] Health Choice, but were declined, whose parents can pay a reduced fee at time of service.  [facility] Department of [facility]  [address], [place] [phone] Accepts children up to age 76 who are enrolled in [place] or [place]; pregnant women with a Medicaid card; and children who have applied for Medicaid or [place] Health Choice, but were declined, whose parents can pay a reduced fee at time of service.  [place] Adult Dental Access PROGRAM  [place] [phone] Patients are seen by appointment only. Walk-ins are not accepted. [place] will see patients 94 years of age and older. Monday - Tuesday (8am-5pm) Most Wednesdays (8:30-5pm) $30 per visit, cash only  [facility] Adult Dental Access PROGRAM  [address], [facility] [phone] Patients are seen by appointment only. Walk-ins are not accepted. [place] will see patients 60 years of age and older. One Wednesday Evening (Monthly: Volunteer Based).  $30 per visit, cash only  [place]  [phone] for adults; Children under age 2, call Graduate Pediatric  Dentistry at [phone]. Children aged 78-14, please call [phone] to request a pediatric application.  Dental services are provided in all areas of dental care including fillings, crowns and bridges, complete and partial dentures, implants, gum treatment, root canals, and extractions. Preventive care is also provided. Treatment is provided to both adults and children. Patients are selected via a lottery and there is often a waiting list.   [facility] [address],  [place]  [phone] [email or web address]   [facility] [address] [place], [place] [phone], Ext. [phone] Second and Fourth Thursday of each month, opens at 6:30 AM; Clinic ends at 9 AM.  Patients are seen on a first-come first-served basis, and a limited number are seen during each clinic.   [facility]  [address] [place]ard [place], [place] [phone]   Eligibility Requirements You must have lived in [place], [place], or [place] counties for at least the last three months.   You cannot be eligible for state or federal sponsored [employer], including [employer], [place], or [employer].   You generally cannot be eligible for healthcare insurance through your employer.    How to apply: Eligibility screenings are held every Tuesday and Wednesday afternoon from 1:00 pm until 4:00 pm. You do not need an appointment for the interview!  [facility] [address] [place], [place]   [place]  [place] Department  [place]  [phone]    Behavioral Health Resources in the Community: Intensive Outpatient Programs Organization         Address  Phone  Notes  [place]. [address], [place], [place] [phone]   [facility] Outpatient [address] [place], [place]   ADS:  Alcohol & Drug Svcs [address], [place], [place]   [place] [address],  [place], [place] or [phone]   Substance Abuse Resources Organization         Address  Phone  Notes  Alcohol and Drug Services  [phone]   [place]  [phone]   [facility] [place]   Chinita Pester  [phone]   Residential & Outpatient Substance Abuse Program  [phone]   Psychological Services Organization         Address  Phone  Notes  [facility] [place]  [place]  [phone]   [place] [address], [place] or [phone]    Mobile Crisis Teams Organization         Address  Phone  Notes  [facility], Mobile Crisis Care Unit  [phone]   [facility] Services  [address] [place], [place]   [place] [address] [place], [place] [phone]    Self-Help/Support Groups Organization         Address  Phone             Notes  [place]. of [place] - variety of support groups  [place] Call for more information  Narcotics Anonymous (NA), [facility] [address], [employer] [place]  2 meetings at this location   Special educational needs teacher         Address  Phone  Notes  [facility]AP Residential Treatment [place],    [place]  [phone]   [facility]  [address] [place] [id number], [place], [place]   [place], [place] [phone] Admissions: 8am-3pm M-F  Incentives Substance [place] [address],    [place], [place] [phone]   [facility] [address] [place], [place], [place]   [facility] [address]  [place], [place]   [facility]  Programs - Intensive Outpatient [address],  [place], [place]   [facility] ([place].,  [place], [place] [phone] or [phone]   [facility]idential Treatment Services (RTS) [address] [place], [place] Accepts Medicaid  [facility] [place] [address]  [place] [phone] Substance Abuse/Addiction Treatment   [facility] Organization         Address  Phone  Notes  [facility]  [phone]   Domenic Schwab, PhD [address] Arlis [place], [place]   [phone] or [phone]   [place], [place] [phone]   [address], [place], [place] [phone] Insurance/Medicaid/sponsorship through [facility] and Families [address]                                    [place], [place] [phone] [place] [address], [place] [phone]    Dr. Adele Schilder  [phone]   [facility] Dept. 1) [address] [place] 2) [place] 3)  [address], [place] [phone]  [phone]   [place] [phone] or [phone] (After Hours)

## 2014-10-13 LAB — CULTURE, GROUP A STREP: Strep A Culture: NEGATIVE

## 2014-11-02 ENCOUNTER — Encounter (HOSPITAL_COMMUNITY): Payer: Self-pay | Admitting: Family Medicine

## 2014-11-02 ENCOUNTER — Other Ambulatory Visit (HOSPITAL_COMMUNITY): Payer: Self-pay

## 2014-11-02 ENCOUNTER — Emergency Department (HOSPITAL_COMMUNITY): Payer: Medicaid Other

## 2014-11-02 ENCOUNTER — Inpatient Hospital Stay (HOSPITAL_COMMUNITY)
Admission: EM | Admit: 2014-11-02 | Discharge: 2014-11-05 | DRG: 247 | Disposition: A | Discharge status: Home / Self Care | Payer: Medicaid Other | Attending: Internal Medicine | Admitting: Internal Medicine

## 2014-11-02 DIAGNOSIS — Z79899 Other long term (current) drug therapy: Secondary | ICD-10-CM

## 2014-11-02 DIAGNOSIS — F17200 Nicotine dependence, unspecified, uncomplicated: Secondary | ICD-10-CM | POA: Diagnosis present

## 2014-11-02 DIAGNOSIS — I214 Non-ST elevation (NSTEMI) myocardial infarction: Secondary | ICD-10-CM | POA: Diagnosis present

## 2014-11-02 DIAGNOSIS — R7989 Other specified abnormal findings of blood chemistry: Secondary | ICD-10-CM

## 2014-11-02 DIAGNOSIS — R778 Other specified abnormalities of plasma proteins: Secondary | ICD-10-CM | POA: Insufficient documentation

## 2014-11-02 DIAGNOSIS — Z9861 Coronary angioplasty status: Secondary | ICD-10-CM

## 2014-11-02 DIAGNOSIS — M542 Cervicalgia: Secondary | ICD-10-CM | POA: Insufficient documentation

## 2014-11-02 DIAGNOSIS — Z955 Presence of coronary angioplasty implant and graft: Secondary | ICD-10-CM

## 2014-11-02 DIAGNOSIS — D649 Anemia, unspecified: Secondary | ICD-10-CM | POA: Diagnosis present

## 2014-11-02 DIAGNOSIS — Z79891 Long term (current) use of opiate analgesic: Secondary | ICD-10-CM

## 2014-11-02 DIAGNOSIS — I2510 Atherosclerotic heart disease of native coronary artery without angina pectoris: Secondary | ICD-10-CM

## 2014-11-02 DIAGNOSIS — Z91041 Radiographic dye allergy status: Secondary | ICD-10-CM

## 2014-11-02 DIAGNOSIS — F17210 Nicotine dependence, cigarettes, uncomplicated: Secondary | ICD-10-CM | POA: Diagnosis present

## 2014-11-02 DIAGNOSIS — E890 Postprocedural hypothyroidism: Secondary | ICD-10-CM | POA: Diagnosis present

## 2014-11-02 DIAGNOSIS — E872 Acidosis: Secondary | ICD-10-CM | POA: Diagnosis present

## 2014-11-02 DIAGNOSIS — R079 Chest pain, unspecified: Secondary | ICD-10-CM

## 2014-11-02 DIAGNOSIS — E785 Hyperlipidemia, unspecified: Secondary | ICD-10-CM | POA: Diagnosis present

## 2014-11-02 LAB — I-STAT TROPONIN, ED: TROPONIN I, POC: 0.09 ng/mL — AB (ref 0.00–0.08)

## 2014-11-02 LAB — CBC
HCT: 38.4 % (ref 36.0–46.0)
Hemoglobin: 12.6 g/dL (ref 12.0–15.0)
MCH: 30.9 pg (ref 26.0–34.0)
MCHC: 32.8 g/dL (ref 30.0–36.0)
MCV: 94.1 fL (ref 78.0–100.0)
Platelets: 240 10*3/uL (ref 150–400)
RBC: 4.08 MIL/uL (ref 3.87–5.11)
RDW: 13.3 % (ref 11.5–15.5)
WBC: 11.2 10*3/uL — ABNORMAL HIGH (ref 4.0–10.5)

## 2014-11-02 LAB — BASIC METABOLIC PANEL
Anion gap: 10 (ref 5–15)
BUN: 16 mg/dL (ref 6–20)
CO2: 21 mmol/L — ABNORMAL LOW (ref 22–32)
Calcium: 8.4 mg/dL — ABNORMAL LOW (ref 8.9–10.3)
Chloride: 107 mmol/L (ref 101–111)
Creatinine, Ser: 0.96 mg/dL (ref 0.44–1.00)
Glucose, Bld: 100 mg/dL — ABNORMAL HIGH (ref 65–99)
Potassium: 3.4 mmol/L — ABNORMAL LOW (ref 3.5–5.1)
SODIUM: 138 mmol/L (ref 135–145)

## 2014-11-02 LAB — TROPONIN I: Troponin I: 0.05 ng/mL — ABNORMAL HIGH (ref <0.031)

## 2014-11-02 MED ORDER — IOHEXOL 350 MG/ML SOLN
80.0000 mL | Freq: Once | INTRAVENOUS | Status: AC | PRN
  Administered 2014-11-02: 100 mL via INTRAVENOUS

## 2014-11-02 MED ORDER — MORPHINE SULFATE 4 MG/ML IJ SOLN
4.0000 mg | Freq: Once | INTRAMUSCULAR | Status: AC
  Administered 2014-11-02: 4 mg via INTRAVENOUS
  Filled 2014-11-02: qty 1

## 2014-11-02 MED ORDER — SODIUM CHLORIDE 0.9 % IV SOLN
INTRAVENOUS | Status: DC
  Administered 2014-11-02: 18:00:00 via INTRAVENOUS

## 2014-11-02 MED ORDER — ASPIRIN EC 325 MG PO TBEC
325.0000 mg | DELAYED_RELEASE_TABLET | Freq: Every day | ORAL | Status: DC
  Administered 2014-11-03: 325 mg via ORAL
  Filled 2014-11-02: qty 1

## 2014-11-02 MED ORDER — ENOXAPARIN SODIUM 40 MG/0.4ML ~~LOC~~ SOLN: 40.0000 mg | SUBCUTANEOUS | Status: DC

## 2014-11-02 MED ORDER — ASPIRIN 81 MG PO CHEW
324.0000 mg | CHEWABLE_TABLET | Freq: Once | ORAL | Status: AC
  Administered 2014-11-02: 324 mg via ORAL
  Filled 2014-11-02: qty 4

## 2014-11-02 MED ORDER — NITROGLYCERIN 2 % TD OINT
0.5000 [in_us] | TOPICAL_OINTMENT | Freq: Four times a day (QID) | TRANSDERMAL | Status: DC
  Filled 2014-11-02: qty 1

## 2014-11-02 MED ORDER — METHOCARBAMOL 500 MG PO TABS
500.0000 mg | ORAL_TABLET | Freq: Two times a day (BID) | ORAL | Status: DC
  Administered 2014-11-03 – 2014-11-05 (×6): 500 mg via ORAL
  Filled 2014-11-02 (×7): qty 1

## 2014-11-02 MED ORDER — NITROGLYCERIN 0.4 MG SL SUBL
0.4000 mg | SUBLINGUAL_TABLET | SUBLINGUAL | Status: DC | PRN
  Administered 2014-11-03: 0.4 mg via SUBLINGUAL

## 2014-11-02 MED ORDER — LEVOTHYROXINE SODIUM 100 MCG PO TABS
100.0000 ug | ORAL_TABLET | Freq: Every day | ORAL | Status: DC
  Administered 2014-11-03 – 2014-11-05 (×3): 100 ug via ORAL
  Filled 2014-11-02 (×4): qty 1

## 2014-11-02 MED ORDER — MORPHINE SULFATE 2 MG/ML IJ SOLN
2.0000 mg | INTRAMUSCULAR | Status: DC | PRN
  Administered 2014-11-02 (×2): 2 mg via INTRAVENOUS
  Filled 2014-11-02: qty 1

## 2014-11-02 MED ORDER — NITROGLYCERIN 2 % TD OINT
0.5000 [in_us] | TOPICAL_OINTMENT | Freq: Four times a day (QID) | TRANSDERMAL | Status: DC
  Administered 2014-11-02: 0.5 [in_us] via TOPICAL

## 2014-11-02 MED ORDER — ACETAMINOPHEN 325 MG PO TABS
650.0000 mg | ORAL_TABLET | ORAL | Status: DC | PRN
  Administered 2014-11-03: 650 mg via ORAL
  Filled 2014-11-02: qty 2

## 2014-11-02 MED ORDER — ONDANSETRON HCL 4 MG/2ML IJ SOLN
4.0000 mg | Freq: Four times a day (QID) | INTRAMUSCULAR | Status: DC | PRN
  Administered 2014-11-03: 4 mg via INTRAVENOUS
  Filled 2014-11-02: qty 2

## 2014-11-02 MED ORDER — LORAZEPAM 2 MG/ML IJ SOLN
1.0000 mg | Freq: Once | INTRAMUSCULAR | Status: AC
  Administered 2014-11-02: 1 mg via INTRAVENOUS
  Filled 2014-11-02: qty 1

## 2014-11-02 NOTE — H&P (Addendum)
[facility] History and Physical  Brittney Bowen BTD:[id number] DOB: 11-02-1955 DOA: 11/02/2014  Referring physician: Jamey Bowen. PCP: No primary care provider on file. Dr.Veita Bowen. Specialists: None.  Chief Complaint: Chest pain.  HPI: Brittney Bowen is a 59 y.o. female with history of postoperative hypothyroidism and tobacco abuse presents to the ER because of chest pain. Patient started having chest pain in the early part of the morning today when she was at the church. Patient's chest pain at that time resolved spontaneously without any intervention. Later in the evening on Sunday patient started having chest pressure radiating to back and the neck with nausea and vomiting. EMS was called and patient was given IV morphine for which patient chest pain improved. In the ER CT angiogram of the chest and CT neck were unremarkable. EKG was showing ST depression in the anterolateral leads with mildly elevated troponin. Patient chest pain at this time has resolved and will be admitted for further management. Patient's abdomen is benign on exam. Patient denies any shortness of breath productive cough fever or chills.   Review of Systems: As presented in the history of presenting illness, rest negative.  Past Medical History  Diagnosis Date  . Thyroid disease    Past Surgical History  Procedure Laterality Date  . Thyroidectomy     Social History:  reports that she has been smoking.  She does not have any smokeless tobacco history on file. She reports that she does not drink alcohol or use illicit drugs. Where does patient live at home. Can patient participate in ADLs? Yes.  Allergies  Allergen Reactions  . Blue Dyes (Parenteral)     Blueberries - anaphylaxis     Family History:  Family History  Problem Relation Age of Onset  . Pulmonary embolism Mother   . CAD Father   . Diabetes Mellitus II Father       Prior to Admission medications   Medication Sig Start Date End  Date Taking? Authorizing Provider  HYDROcodone-acetaminophen (NORCO) 5-325 MG per tablet Take 2 tablets by mouth every 4 (four) hours as needed. 09/15/14   Comer Locket, PA-C  levothyroxine (SYNTHROID, LEVOTHROID) 100 MCG tablet Take 1 tablet (100 mcg total) by mouth daily before breakfast. 10/10/14   Dahlia Bailiff, PA-C  methocarbamol (ROBAXIN) 500 MG tablet Take 1 tablet (500 mg total) by mouth 2 (two) times daily. 09/15/14   Comer Locket, PA-C  naproxen (NAPROSYN) 500 MG tablet Take 1 tablet (500 mg total) by mouth 2 (two) times daily. 09/15/14   Comer Locket, PA-C  traMADol (ULTRAM) 50 MG tablet Take 50 mg by mouth every 6 (six) hours as needed.    Historical Provider, MD    Physical Exam: Filed Vitals:   11/02/14 2032 11/02/14 2047 11/02/14 2100 11/02/14 2213  BP:  133/96 125/84 135/83  Pulse: 66 61 67   Temp:    97.7 F (36.5 C)  Resp: 13 16 14 16   Height:    5\' 9"  (1.753 m)  Weight:    93.622 kg (206 lb 6.4 oz)  SpO2: 100% 100% 100% 100%     General:  Well-developed moderately nourished.  Eyes: Anicteric. No pallor.  ENT: No discharge from the ears eyes nose and mouth.  Neck: No mass felt. No JVD appreciated.  Cardiovascular: S1-S2 heard.  Respiratory: No rhonchi or crepitations.  Abdomen: Soft nontender bowel sounds present.  Skin: No rash.  Musculoskeletal: No edema.  Psychiatric: Appears normal.  Neurologic: Alert awake oriented to time  place and person. Moves all extremities 5 x 5.  Labs on Admission:  Basic Metabolic Panel:  Recent Labs Lab 11/02/14 1753  NA 138  K 3.4*  CL 107  CO2 21*  GLUCOSE 100*  BUN 16  CREATININE 0.96  CALCIUM 8.4*   Liver Function Tests: No results for input(s): AST, ALT, ALKPHOS, BILITOT, PROT, ALBUMIN in the last 168 hours. No results for input(s): LIPASE, AMYLASE in the last 168 hours. No results for input(s): AMMONIA in the last 168 hours. CBC:  Recent Labs Lab 11/02/14 1753  WBC 11.2*  HGB 12.6  HCT  38.4  MCV 94.1  PLT 240   Cardiac Enzymes:  Recent Labs Lab 11/02/14 1752  TROPONINI 0.05*    BNP (last 3 results) No results for input(s): BNP in the last 8760 hours.  ProBNP (last 3 results) No results for input(s): PROBNP in the last 8760 hours.  CBG: No results for input(s): GLUCAP in the last 168 hours.  Radiological Exams on Admission: Dg Chest 2 View  11/02/2014   CLINICAL DATA:  Left-sided chest pain this morning  EXAM: CHEST  2 VIEW  COMPARISON:  01/22/2010  FINDINGS: Cardiac shadow is within normal limits. The lungs are well aerated bilaterally. Minimal right middle lobe atelectasis is seen. No effusion is noted. No bony abnormality is seen.  IMPRESSION: Right middle lobe atelectasis.   Electronically Signed   By: Inez Catalina M.D.   On: 11/02/2014 18:16   Ct Angio Chest Pe W/cm &/or Wo Cm  11/02/2014   CLINICAL DATA:  Acute onset of neck and shoulder pain. Chest pain and shortness of breath. Initial encounter.  EXAM: CT ANGIOGRAPHY CHEST WITH CONTRAST  TECHNIQUE: Multidetector CT imaging of the chest was performed using the standard protocol during bolus administration of intravenous contrast. Multiplanar CT image reconstructions and MIPs were obtained to evaluate the vascular anatomy.  CONTRAST:  173mL OMNIPAQUE IOHEXOL 350 MG/ML SOLN  COMPARISON:  Chest radiograph performed earlier today at 5:59 p.m.  FINDINGS: There is no evidence of pulmonary embolus.  Mild bilateral atelectasis is noted. The lungs are otherwise clear. There is no evidence of significant focal consolidation, pleural effusion or pneumothorax. No masses are identified; no abnormal focal contrast enhancement is seen.  The mediastinum is unremarkable in appearance. No mediastinal lymphadenopathy is seen. The great vessels are grossly unremarkable. The patient is status post thyroidectomy. No pericardial effusion is identified. No axillary lymphadenopathy is seen.  The visualized portions of the liver and spleen  are unremarkable.  No acute osseous abnormalities are seen.  Review of the MIP images confirms the above findings.  IMPRESSION: 1. No evidence of pulmonary embolus. 2. Mild bilateral atelectasis noted.  Lungs otherwise clear.   Electronically Signed   By: Garald Balding M.D.   On: 11/02/2014 21:01   Ct Cervical Spine Wo Contrast  11/02/2014   CLINICAL DATA:  Neck and chest pain  EXAM: CT CERVICAL SPINE WITHOUT CONTRAST  TECHNIQUE: Multidetector CT imaging of the cervical spine was performed without intravenous contrast. Multiplanar CT image reconstructions were also generated.  COMPARISON:  01/22/2010  FINDINGS: Seven cervical segments are well visualized. Vertebral body height is well maintained. Facet hypertrophic changes are noted. No acute facet abnormality or acute fracture is seen. Mild disco-osteophytic changes are seen without significant central canal stenosis. Surrounding soft tissues and visualized lung apices are within normal limits.  IMPRESSION: Degenerative changes without acute abnormality.   Electronically Signed   By: Linus Mako.D.  On: 11/02/2014 20:50    EKG: Independently reviewed. Normal sinus rhythm with ST depression in anterolateral leads.  Assessment/Plan Principal Problem:   Chest pain Active Problems:   Elevated troponin   Neck pain   1. Chest pain with elevated troponin and EKG changes concerning for non-ST elevation MI - patient has already received aspirin and has been placed on Nitropatch. Will keep patient nothing by mouth past midnight in anticipation of cardiac catheterization. Cycle cardiac markers. Check 2-D echo check urine drug screen. 2. Postoperative hypothyroidism - check TSH continue Synthroid. 3. Metabolic acidosis probably from nausea vomiting - recheck metabolic panel. Nausea vomiting probably from #1. Abdomen appears benign. Check LFTs and lipase.  Addendum - patient started to have recurrent chest pain. EKG done at this time is showing more  pronounced ST depression in the anterolateral leads. Troponin is getting more elevated at 0.4 at this time. I have started patient on IV nitroglycerin infusion and heparin infusion and given Lipitor 80 mg and place patient on metoprolol 12.5 mg by mouth twice a day one dose now. I have consulted on-call cardiologist Dr. Levin Erp. Dr. Levin Erp is requested to add BRILINTA 180 mg. Patient will be transferred to cardiac ICU for further cardiac interventions. Patient will be kept nothing by mouth. Dr. Levin Erp is advised to hold off narcotics for now. Patient's husband updated.   DVT Prophylaxis Lovenox.  Code Status: Full code.  Family Communication: Patient's husband at the bedside.  Disposition Plan: Admit for observation.    Katharine Rochefort N. [facility] Pager [phone].  If 7PM-7AM, please contact night-coverage [email or web address] Password [facility] 11/02/2014, 11:24 PM

## 2014-11-02 NOTE — ED Notes (Signed)
Pt sts sharp chest pain, neck pain and back pain since this am. sts she took some pain meds and it eased but then came back. sts the pain is nagging.

## 2014-11-02 NOTE — ED Notes (Signed)
Spoke with Agricultural consultant at [employer]. APPT at 2204

## 2014-11-02 NOTE — ED Provider Notes (Signed)
CSN: [id number]     Arrival date & time 11/02/14  1718 History   First MD Initiated Contact with Patient 11/02/14 1738     Chief Complaint  Patient presents with  . Chest Pain  . Back Pain  . Neck Pain     (Consider location/radiation/quality/duration/timing/severity/associated sxs/prior Treatment) HPI Comments: Patient with acute onset of lower cervical neck pain that radiated to her thoracic spine that began while she was at church. Pain characterized as sharp and severe and worse with movement. It does go down to her left arm. Denies any chest pain or chest pressure. She did have emesis 1 because the pain. Denies any recent history of trauma. Patient took tramadol with some relief. Pain is persistent and no prior history of same. Denies any palpitations. No syncope or near syncope.  Patient is a 59 y.o. female presenting with chest pain, back pain, and neck pain. The history is provided by the patient.  Chest Pain Associated symptoms: back pain   Back Pain Associated symptoms: chest pain   Neck Pain Associated symptoms: chest pain     Past Medical History  Diagnosis Date  . Thyroid disease    Past Surgical History  Procedure Laterality Date  . Thyroidectomy     History reviewed. No pertinent family history. History  Substance Use Topics  . Smoking status: Current Every Day Smoker  . Smokeless tobacco: Not on file  . Alcohol Use: No   OB History    No data available     Review of Systems  Cardiovascular: Positive for chest pain.  Musculoskeletal: Positive for back pain and neck pain.  All other systems reviewed and are negative.     Allergies  Blue dyes (parenteral)  Home Medications   Prior to Admission medications   Medication Sig Start Date End Date Taking? Authorizing Provider  HYDROcodone-acetaminophen (NORCO) 5-325 MG per tablet Take 2 tablets by mouth every 4 (four) hours as needed. 09/15/14   Comer Locket, PA-C  levothyroxine (SYNTHROID,  LEVOTHROID) 100 MCG tablet Take 1 tablet (100 mcg total) by mouth daily before breakfast. 10/10/14   Dahlia Bailiff, PA-C  methocarbamol (ROBAXIN) 500 MG tablet Take 1 tablet (500 mg total) by mouth 2 (two) times daily. 09/15/14   Comer Locket, PA-C  naproxen (NAPROSYN) 500 MG tablet Take 1 tablet (500 mg total) by mouth 2 (two) times daily. 09/15/14   Benjamin Cartner, PA-C   BP 144/106 mmHg  Pulse 81  Temp(Src) 97.5 F (36.4 C)  Resp 24  SpO2 99% Physical Exam  Constitutional: She is oriented to person, place, and time. She appears well-developed and well-nourished.  Non-toxic appearance. No distress.  HENT:  Head: Normocephalic and atraumatic.  Eyes: Conjunctivae, EOM and lids are normal. Pupils are equal, round, and reactive to light.  Neck: Normal range of motion. Neck supple. No tracheal deviation present. No thyroid mass present.  Cardiovascular: Normal rate, regular rhythm and normal heart sounds.  Exam reveals no gallop.   No murmur heard. Pulmonary/Chest: Effort normal and breath sounds normal. No stridor. No respiratory distress. She has no decreased breath sounds. She has no wheezes. She has no rhonchi. She has no rales.  Abdominal: Soft. Normal appearance and bowel sounds are normal. She exhibits no distension. There is no tenderness. There is no rebound and no CVA tenderness.  Musculoskeletal: Normal range of motion. She exhibits no edema or tenderness.       Back:  Neurological: She is alert and oriented to person, place,  and time. She has normal strength. No cranial nerve deficit or sensory deficit. GCS eye subscore is 4. GCS verbal subscore is 5. GCS motor subscore is 6.  Skin: Skin is warm and dry. No abrasion and no rash noted.  Psychiatric: She has a normal mood and affect. Her speech is normal and behavior is normal.  Nursing note and vitals reviewed.   ED Course  Procedures (including critical care time) Labs Review Labs Reviewed  [place], ED    Imaging Review No results found.   EKG Interpretation   Date/Time:  Sunday Nov 02 2014 17:24:56 EDT Ventricular Rate:  78 PR Interval:  170 QRS Duration: 80 QT Interval:  412 QTC Calculation: 469 R Axis:   13 Text Interpretation:  Normal sinus rhythm Possible Left atrial enlargement  Low voltage QRS ST \\T \ T wave abnormality, consider inferior ischemia ST  \T\ T wave abnormality, consider anterior ischemia Abnormal ECG No  significant change since last tracing Confirmed by Triston Lisanti  MD, Brysten Reister  ([id number]) on 11/02/2014 5:44:34 PM      MDM   Final diagnoses:  Neck pain    Patient given medications for back pain does flow slightly better. Patient given aspirin and Nitropaste for possible angina symptoms after her troponin was elevated. CT of the chest negative for dissection or PE. Repeat EKG shows no signs of acute ischemic changes. Will be admitted to medicine service    Lacretia Leigh, MD 11/02/14 2129

## 2014-11-03 ENCOUNTER — Inpatient Hospital Stay (HOSPITAL_COMMUNITY): Payer: Medicaid Other

## 2014-11-03 DIAGNOSIS — Z79899 Other long term (current) drug therapy: Secondary | ICD-10-CM | POA: Diagnosis not present

## 2014-11-03 DIAGNOSIS — Z720 Tobacco use: Secondary | ICD-10-CM

## 2014-11-03 DIAGNOSIS — F17210 Nicotine dependence, cigarettes, uncomplicated: Secondary | ICD-10-CM | POA: Diagnosis present

## 2014-11-03 DIAGNOSIS — I2510 Atherosclerotic heart disease of native coronary artery without angina pectoris: Secondary | ICD-10-CM | POA: Diagnosis not present

## 2014-11-03 DIAGNOSIS — E785 Hyperlipidemia, unspecified: Secondary | ICD-10-CM | POA: Diagnosis not present

## 2014-11-03 DIAGNOSIS — Z79891 Long term (current) use of opiate analgesic: Secondary | ICD-10-CM | POA: Diagnosis not present

## 2014-11-03 DIAGNOSIS — E890 Postprocedural hypothyroidism: Secondary | ICD-10-CM | POA: Diagnosis not present

## 2014-11-03 DIAGNOSIS — I214 Non-ST elevation (NSTEMI) myocardial infarction: Secondary | ICD-10-CM | POA: Diagnosis present

## 2014-11-03 DIAGNOSIS — R079 Chest pain, unspecified: Secondary | ICD-10-CM

## 2014-11-03 DIAGNOSIS — D649 Anemia, unspecified: Secondary | ICD-10-CM | POA: Diagnosis not present

## 2014-11-03 DIAGNOSIS — R7989 Other specified abnormal findings of blood chemistry: Secondary | ICD-10-CM | POA: Diagnosis not present

## 2014-11-03 DIAGNOSIS — E872 Acidosis: Secondary | ICD-10-CM | POA: Diagnosis not present

## 2014-11-03 DIAGNOSIS — Z91041 Radiographic dye allergy status: Secondary | ICD-10-CM | POA: Diagnosis not present

## 2014-11-03 LAB — CBC WITH DIFFERENTIAL/PLATELET
Basophils Absolute: 0 10*3/uL (ref 0.0–0.1)
Basophils Relative: 0 % (ref 0–1)
EOS ABS: 0.2 10*3/uL (ref 0.0–0.7)
EOS PCT: 2 % (ref 0–5)
HCT: 34.1 % — ABNORMAL LOW (ref 36.0–46.0)
Hemoglobin: 11.2 g/dL — ABNORMAL LOW (ref 12.0–15.0)
LYMPHS PCT: 35 % (ref 12–46)
Lymphs Abs: 3.3 10*3/uL (ref 0.7–4.0)
MCH: 31.5 pg (ref 26.0–34.0)
MCHC: 32.8 g/dL (ref 30.0–36.0)
MCV: 96.1 fL (ref 78.0–100.0)
Monocytes Absolute: 0.6 10*3/uL (ref 0.1–1.0)
Monocytes Relative: 6 % (ref 3–12)
Neutro Abs: 5.4 10*3/uL (ref 1.7–7.7)
Neutrophils Relative %: 57 % (ref 43–77)
PLATELETS: 202 10*3/uL (ref 150–400)
RBC: 3.55 MIL/uL — ABNORMAL LOW (ref 3.87–5.11)
RDW: 13.4 % (ref 11.5–15.5)
WBC: 9.5 10*3/uL (ref 4.0–10.5)

## 2014-11-03 LAB — HEPATIC FUNCTION PANEL
ALBUMIN: 3.7 g/dL (ref 3.5–5.0)
ALT: 18 U/L (ref 14–54)
AST: 22 U/L (ref 15–41)
Alkaline Phosphatase: 74 U/L (ref 38–126)
BILIRUBIN DIRECT: 0.2 mg/dL (ref 0.1–0.5)
Indirect Bilirubin: 1 mg/dL — ABNORMAL HIGH (ref 0.3–0.9)
TOTAL PROTEIN: 7.3 g/dL (ref 6.5–8.1)
Total Bilirubin: 1.2 mg/dL (ref 0.3–1.2)

## 2014-11-03 LAB — RAPID URINE DRUG SCREEN, HOSP PERFORMED
Amphetamines: NOT DETECTED
BENZODIAZEPINES: POSITIVE — AB
Barbiturates: NOT DETECTED
Cocaine: NOT DETECTED
OPIATES: POSITIVE — AB
TETRAHYDROCANNABINOL: NOT DETECTED

## 2014-11-03 LAB — T4, FREE: FREE T4: 0.89 ng/dL (ref 0.61–1.12)

## 2014-11-03 LAB — HEPARIN LEVEL (UNFRACTIONATED)
Heparin Unfractionated: 0.16 IU/mL — ABNORMAL LOW (ref 0.30–0.70)
Heparin Unfractionated: 0.33 IU/mL (ref 0.30–0.70)

## 2014-11-03 LAB — TROPONIN I
TROPONIN I: 0.45 ng/mL — AB (ref <0.031)
Troponin I: 6.37 ng/mL (ref <0.031)
Troponin I: 17.76 ng/mL (ref <0.031)
Troponin I: 19.75 ng/mL (ref <0.031)

## 2014-11-03 LAB — LIPASE, BLOOD: Lipase: 18 U/L — ABNORMAL LOW (ref 22–51)

## 2014-11-03 LAB — TSH: TSH: 15.001 u[IU]/mL — ABNORMAL HIGH (ref 0.350–4.500)

## 2014-11-03 LAB — MRSA PCR SCREENING: MRSA by PCR: NEGATIVE

## 2014-11-03 MED ORDER — CETYLPYRIDINIUM CHLORIDE 0.05 % MT LIQD
7.0000 mL | Freq: Two times a day (BID) | OROMUCOSAL | Status: DC
  Administered 2014-11-03 – 2014-11-05 (×5): 7 mL via OROMUCOSAL

## 2014-11-03 MED ORDER — HEPARIN BOLUS VIA INFUSION
4000.0000 [IU] | Freq: Once | INTRAVENOUS | Status: AC
  Administered 2014-11-03: 4000 [IU] via INTRAVENOUS
  Filled 2014-11-03: qty 4000

## 2014-11-03 MED ORDER — ASPIRIN EC 81 MG PO TBEC
81.0000 mg | DELAYED_RELEASE_TABLET | Freq: Every day | ORAL | Status: DC
  Administered 2014-11-04: 81 mg via ORAL
  Filled 2014-11-03: qty 1

## 2014-11-03 MED ORDER — SODIUM CHLORIDE 0.9 % IV SOLN: INTRAVENOUS | Status: DC

## 2014-11-03 MED ORDER — SODIUM CHLORIDE 0.9 % IV SOLN: 250.0000 mL | INTRAVENOUS | Status: DC | PRN

## 2014-11-03 MED ORDER — HEPARIN BOLUS VIA INFUSION
2000.0000 [IU] | Freq: Once | INTRAVENOUS | Status: AC
  Administered 2014-11-03: 2000 [IU] via INTRAVENOUS
  Filled 2014-11-03: qty 2000

## 2014-11-03 MED ORDER — MORPHINE SULFATE 2 MG/ML IJ SOLN
2.0000 mg | INTRAMUSCULAR | Status: DC | PRN
  Administered 2014-11-03: 4 mg via INTRAVENOUS
  Filled 2014-11-03: qty 1
  Filled 2014-11-03: qty 2

## 2014-11-03 MED ORDER — NITROGLYCERIN IN D5W 200-5 MCG/ML-% IV SOLN
0.0000 ug/min | INTRAVENOUS | Status: DC
  Administered 2014-11-03: 5 ug/min via INTRAVENOUS
  Filled 2014-11-03 (×2): qty 250

## 2014-11-03 MED ORDER — TICAGRELOR 90 MG PO TABS
90.0000 mg | ORAL_TABLET | Freq: Two times a day (BID) | ORAL | Status: DC
  Administered 2014-11-03 – 2014-11-04 (×4): 90 mg via ORAL
  Filled 2014-11-03 (×5): qty 1

## 2014-11-03 MED ORDER — HEPARIN (PORCINE) IN NACL 100-0.45 UNIT/ML-% IJ SOLN
1400.0000 [IU]/h | INTRAMUSCULAR | Status: DC
  Administered 2014-11-03 (×2): 1400 [IU]/h via INTRAVENOUS
  Filled 2014-11-03 (×2): qty 250

## 2014-11-03 MED ORDER — HEPARIN (PORCINE) IN NACL 100-0.45 UNIT/ML-% IJ SOLN
1100.0000 [IU]/h | INTRAMUSCULAR | Status: DC
  Administered 2014-11-03: 1100 [IU] via INTRAVENOUS
  Filled 2014-11-03: qty 250

## 2014-11-03 MED ORDER — SODIUM CHLORIDE 0.9 % IJ SOLN: 3.0000 mL | INTRAMUSCULAR | Status: DC | PRN

## 2014-11-03 MED ORDER — METOPROLOL TARTRATE 12.5 MG HALF TABLET
12.5000 mg | ORAL_TABLET | Freq: Two times a day (BID) | ORAL | Status: DC
  Administered 2014-11-03 – 2014-11-05 (×5): 12.5 mg via ORAL
  Filled 2014-11-03 (×6): qty 1

## 2014-11-03 MED ORDER — HEPARIN (PORCINE) IN NACL 100-0.45 UNIT/ML-% IJ SOLN
INTRAMUSCULAR | Status: AC
  Administered 2014-11-03: 1100 [IU] via INTRAVENOUS
  Filled 2014-11-03: qty 250

## 2014-11-03 MED ORDER — HYDROMORPHONE HCL 1 MG/ML IJ SOLN: 0.5000 mg | Freq: Once | INTRAMUSCULAR | Status: DC

## 2014-11-03 MED ORDER — SODIUM CHLORIDE 0.9 % IV SOLN
INTRAVENOUS | Status: DC
  Administered 2014-11-03 (×2): via INTRAVENOUS

## 2014-11-03 MED ORDER — NITROGLYCERIN 0.4 MG SL SUBL
SUBLINGUAL_TABLET | SUBLINGUAL | Status: AC
  Administered 2014-11-03: 0.4 mg via SUBLINGUAL
  Filled 2014-11-03: qty 1

## 2014-11-03 MED ORDER — TICAGRELOR 90 MG PO TABS
180.0000 mg | ORAL_TABLET | Freq: Once | ORAL | Status: AC
  Administered 2014-11-03: 180 mg via ORAL
  Filled 2014-11-03: qty 2

## 2014-11-03 MED ORDER — ASPIRIN 81 MG PO CHEW
81.0000 mg | CHEWABLE_TABLET | ORAL | Status: AC
  Administered 2014-11-04: 81 mg via ORAL
  Filled 2014-11-03: qty 1

## 2014-11-03 MED ORDER — SODIUM CHLORIDE 0.9 % IJ SOLN
3.0000 mL | Freq: Two times a day (BID) | INTRAMUSCULAR | Status: DC
  Administered 2014-11-04: 3 mL via INTRAVENOUS

## 2014-11-03 MED ORDER — HYDROMORPHONE HCL 1 MG/ML IJ SOLN
0.5000 mg | INTRAMUSCULAR | Status: DC | PRN
  Administered 2014-11-03: 1 mg via INTRAVENOUS
  Filled 2014-11-03 (×2): qty 1

## 2014-11-03 MED ORDER — ATORVASTATIN CALCIUM 80 MG PO TABS
80.0000 mg | ORAL_TABLET | Freq: Every day | ORAL | Status: DC
  Administered 2014-11-03 – 2014-11-04 (×3): 80 mg via ORAL
  Filled 2014-11-03 (×4): qty 1

## 2014-11-03 NOTE — Consult Note (Addendum)
Referring Physician: Rise Patience, MD (Hospitalist) Primary Cardiologist: None  Reason for Consultation: NSTEMI   HPI:  59 yo AA woman, smoker, no known CAD, admitted to Medicine service with CP. Symptoms started in the morning while she was at [facility]. Felt substernal and left sided discomfort, waxing and waning, radiating to neck and left arm, associated with SOB. Denies dizziness or syncope. Never had similar symptoms in the past. No history of stress test or heart cath.. Father had heart attack at age over 41. Upon admission, she was started on ASA and CP rule out protocol. Her chest discomfort recurred last night along with elevation in TnI (from 0.05 to 0.45). EKG suggestive of anterolateral ST depression. We are consulted for input. Recommended holding narcotics, to give Brilinta 180 mg po bolus, heparin bolus and infusion and agreed with initiation of NTG infusion (now at 20 mcg with SBP over 100). Patient has been transferred to cardiac floor and now reporting to be feeling better with resolution of chest discomfort.   She uses 3 pillows at baseline since her thyroid surgery.    Review of Systems:  10 systems reviewed unremarkable except as noted in HPI     Past Medical History  Diagnosis Date  . Thyroid disease     Medications Prior to Admission  Medication Sig Dispense Refill  . HYDROcodone-acetaminophen (NORCO) 5-325 MG per tablet Take 2 tablets by mouth every 4 (four) hours as needed. 10 tablet 0  . levothyroxine (SYNTHROID, LEVOTHROID) 100 MCG tablet Take 1 tablet (100 mcg total) by mouth daily before breakfast. 30 tablet 0  . methocarbamol (ROBAXIN) 500 MG tablet Take 1 tablet (500 mg total) by mouth 2 (two) times daily. 20 tablet 0  . naproxen (NAPROSYN) 500 MG tablet Take 1 tablet (500 mg total) by mouth 2 (two) times daily. 30 tablet 0  . traMADol (ULTRAM) 50 MG tablet Take 50 mg by mouth every 6 (six) hours as needed.       Marland Kitchen aspirin EC  325 mg Oral  Daily  . atorvastatin  80 mg Oral q1800  . levothyroxine  100 mcg Oral QAC breakfast  . methocarbamol  500 mg Oral BID  . metoprolol tartrate  12.5 mg Oral BID  . ticagrelor  90 mg Oral BID    Infusions: . heparin 1,100 Units (11/03/14 0126)  . nitroGLYCERIN 20 mcg/min (11/03/14 6578)    Allergies  Allergen Reactions  . Blue Dyes (Parenteral)     Blueberries - anaphylaxis     History   Social History  . Marital Status: Married    Spouse Name: N/A  . Number of Children: N/A  . Years of Education: N/A   Occupational History  . Not on file.   Social History Main Topics  . Smoking status: Current Every Day Smoker  . Smokeless tobacco: Not on file  . Alcohol Use: No  . Drug Use: No  . Sexual Activity: Not on file   Other Topics Concern  . Not on file   Social History Narrative    Family History  Problem Relation Age of Onset  . Pulmonary embolism Mother   . CAD Father   . Diabetes Mellitus II Father     PHYSICAL EXAM: Filed Vitals:   11/03/14 0020  BP: 113/62  Pulse: 71  Temp: 97.6 F (36.4 C)  Resp:     No intake or output data in the 24 hours ending 11/03/14 0232  General:  Well appearing. No  respiratory difficulty at rest  HEENT: normal, EOMI,  Neck: supple. no JVD. Carotids 2+ bilat; no bruits. No lymphadenopathy or thryomegaly appreciated. Cor: PMI nondisplaced. Regular rate & rhythm. No rubs, gallops or murmurs. Lungs: clear Abdomen: soft, nontender, nondistended. No hepatosplenomegaly. No bruits or masses. Good bowel sounds. Extremities: no cyanosis, clubbing, rash, edema Neuro: alert & oriented x 3, cranial nerves grossly intact. moves all 4 extremities w/o difficulty. Affect pleasant.  ECG: sinus, anterolateral ST depression  Results for orders placed or performed during the hospital encounter of 11/02/14 (from the past 24 hour(s))  Troponin I     Status: Abnormal   Collection Time: 11/02/14  5:52 PM  Result Value Ref Range   Troponin I  0.05 (H) <0.031 ng/mL  CBC     Status: Abnormal   Collection Time: 11/02/14  5:53 PM  Result Value Ref Range   WBC 11.2 (H) 4.0 - 10.5 K/uL   RBC 4.08 3.87 - 5.11 MIL/uL   Hemoglobin 12.6 12.0 - 15.0 g/dL   HCT 38.4 36.0 - 46.0 %   MCV 94.1 78.0 - 100.0 fL   MCH 30.9 26.0 - 34.0 pg   MCHC 32.8 30.0 - 36.0 g/dL   RDW 13.3 11.5 - 15.5 %   Platelets 240 150 - 400 K/uL  Basic metabolic panel     Status: Abnormal   Collection Time: 11/02/14  5:53 PM  Result Value Ref Range   Sodium 138 135 - 145 mmol/L   Potassium 3.4 (L) 3.5 - 5.1 mmol/L   Chloride 107 101 - 111 mmol/L   CO2 21 (L) 22 - 32 mmol/L   Glucose, Bld 100 (H) 65 - 99 mg/dL   BUN 16 6 - 20 mg/dL   Creatinine, Ser 0.96 0.44 - 1.00 mg/dL   Calcium 8.4 (L) 8.9 - 10.3 mg/dL   GFR calc non Af Amer >60 >60 mL/min   GFR calc Af Amer >60 >60 mL/min   Anion gap 10 5 - 15  I-stat troponin, ED  (not at [facility])     Status: Abnormal   Collection Time: 11/02/14  5:58 PM  Result Value Ref Range   Troponin i, poc 0.09 (HH) 0.00 - 0.08 ng/mL   Comment NOTIFIED PHYSICIAN    Comment 3          Troponin I (q 6hr x 3)     Status: Abnormal   Collection Time: 11/02/14 11:58 PM  Result Value Ref Range   Troponin I 0.45 (H) <0.031 ng/mL  Hepatic function panel     Status: Abnormal   Collection Time: 11/02/14 11:58 PM  Result Value Ref Range   Total Protein 7.3 6.5 - 8.1 g/dL   Albumin 3.7 3.5 - 5.0 g/dL   AST 22 15 - 41 U/L   ALT 18 14 - 54 U/L   Alkaline Phosphatase 74 38 - 126 U/L   Total Bilirubin 1.2 0.3 - 1.2 mg/dL   Bilirubin, Direct 0.2 0.1 - 0.5 mg/dL   Indirect Bilirubin 1.0 (H) 0.3 - 0.9 mg/dL  Lipase, blood     Status: Abnormal   Collection Time: 11/02/14 11:58 PM  Result Value Ref Range   Lipase 18 (L) 22 - 51 U/L   Dg Chest 2 View  11/02/2014   CLINICAL DATA:  Left-sided chest pain this morning  EXAM: CHEST  2 VIEW  COMPARISON:  01/22/2010  FINDINGS: Cardiac shadow is within normal limits. The lungs are well aerated  bilaterally. Minimal right  middle lobe atelectasis is seen. No effusion is noted. No bony abnormality is seen.  IMPRESSION: Right middle lobe atelectasis.   Electronically Signed   By: Inez Catalina M.D.   On: 11/02/2014 18:16   Ct Angio Chest Pe W/cm &/or Wo Cm  11/02/2014   CLINICAL DATA:  Acute onset of neck and shoulder pain. Chest pain and shortness of breath. Initial encounter.  EXAM: CT ANGIOGRAPHY CHEST WITH CONTRAST  TECHNIQUE: Multidetector CT imaging of the chest was performed using the standard protocol during bolus administration of intravenous contrast. Multiplanar CT image reconstructions and MIPs were obtained to evaluate the vascular anatomy.  CONTRAST:  126mL OMNIPAQUE IOHEXOL 350 MG/ML SOLN  COMPARISON:  Chest radiograph performed earlier today at 5:59 p.m.  FINDINGS: There is no evidence of pulmonary embolus.  Mild bilateral atelectasis is noted. The lungs are otherwise clear. There is no evidence of significant focal consolidation, pleural effusion or pneumothorax. No masses are identified; no abnormal focal contrast enhancement is seen.  The mediastinum is unremarkable in appearance. No mediastinal lymphadenopathy is seen. The great vessels are grossly unremarkable. The patient is status post thyroidectomy. No pericardial effusion is identified. No axillary lymphadenopathy is seen.  The visualized portions of the liver and spleen are unremarkable.  No acute osseous abnormalities are seen.  Review of the MIP images confirms the above findings.  IMPRESSION: 1. No evidence of pulmonary embolus. 2. Mild bilateral atelectasis noted.  Lungs otherwise clear.   Electronically Signed   By: Garald Balding M.D.   On: 11/02/2014 21:01   Ct Cervical Spine Wo Contrast  11/02/2014   CLINICAL DATA:  Neck and chest pain  EXAM: CT CERVICAL SPINE WITHOUT CONTRAST  TECHNIQUE: Multidetector CT imaging of the cervical spine was performed without intravenous contrast. Multiplanar CT image reconstructions were  also generated.  COMPARISON:  01/22/2010  FINDINGS: Seven cervical segments are well visualized. Vertebral body height is well maintained. Facet hypertrophic changes are noted. No acute facet abnormality or acute fracture is seen. Mild disco-osteophytic changes are seen without significant central canal stenosis. Surrounding soft tissues and visualized lung apices are within normal limits.  IMPRESSION: Degenerative changes without acute abnormality.   Electronically Signed   By: Inez Catalina M.D.   On: 11/02/2014 20:50     ASSESSMENT:  1. NSTEMI - stable hemodynamically  - TIMI risk score 3-4 with 13-20% two week composite risk of adverse outcomes - CVD risk factor = smoking   PLAN/DISCUSSION:  - Appropriate medical therapy has been initiated including beta blocker and high potency statin - If her symptoms recur despite on NTG infusion, then will take her to cath lab emergently. Otherwise, will plan cath within the next 24-48 hours. Risks and benefits of the cath discussed with the patient in the present of her husband and they are in agreement with the procedure.  - Echo - Will keep her NPO for now   Discussed with on call interventional cardiology.   Thanks for the consult.   Wandra Mannan, MD Cardiology

## 2014-11-03 NOTE — Progress Notes (Signed)
Called by Carmelina Paddock, pt's primary RN for increased CP shortly after arrival from ED.  On my arrival pt sitting on edge of bed reporting 10/10 CP with radiation to back and left arm.  Also c/o N/V and SOB, pt diaphoretic and in mild distress.  Husband at bedside.  Repeat EKG obtained by 3W staff and Dr Hal Hope paged and to bedside.  EKG with new ST depression, second trop elevated at 0.45.  Pt given 1mg  Dilaudid, on 4L [place], started on Heprin and Nitro drips and transferred to [facility] with cards consult.  At bedside handoff with [place], [place] on [address], pt appears more comfortable and reports minimal pain.  Vitals stable throughout.

## 2014-11-03 NOTE — Progress Notes (Signed)
  [facility] TEAM 1 - Stepdown/ICU TEAM  Noted attending name changed to Cardiology service.  Chart reviewed.  All active medical issues are currently being addressed by Cardiology, w/ pt awaiting cardiac cath in AM.  [facility] will f/u in the AM on a consultative basis.  Will not adjust synthroid tx until coronary status more stable.  Check FT4.  Thanks to Cardiology for their excellent care.  Cherene Altes, MD [facility] For Consults/Admissions - Flow Manager [phone] Office  [phone]  Contact MD directly via text page:      [email or web address]      password [facility]

## 2014-11-03 NOTE — Progress Notes (Signed)
Echocardiogram 2D Echocardiogram has been performed.  Brittney Bowen 11/03/2014, 4:03 PM

## 2014-11-03 NOTE — Progress Notes (Signed)
CRITICAL VALUE ALERT  Critical value received:  Troponin 6.37  Date of notification:  11/03/14  Time of notification:  0837  Critical value read back:Yes.    Nurse who received alert:  Raj Janus, RN  MD notified (1st page):  Dr Harrington Challenger  Time of first page:  [phone]  Responding MD:  Dr. Harrington Challenger  Time MD responded:  [phone] Expected lab results, no changes to treatment at this time.

## 2014-11-03 NOTE — Progress Notes (Signed)
Pt developed 10/10 chest pain radiating to shoulder and neck after walking back from the bathroom. NTG was titrated and pt's BP softer with titrations. EKG obtained with no acute changes. Dr. Kathlen Mody paged and orders received. Pt educated on activity limitations/restrictions for now due to pain.    4mg  of morphine given with pain relief and VSS.

## 2014-11-03 NOTE — Progress Notes (Signed)
Patient complaining of severe back pain radiating to the chest. EKG being obtain by NT. Rogue Bussing with [facility] notified.

## 2014-11-03 NOTE — Progress Notes (Signed)
Subjective: No CP  No SOB  No dizziness   Objective: Filed Vitals:   11/03/14 0600 11/03/14 0700 11/03/14 0726 11/03/14 0750  BP: 103/58 102/65 100/62 97/69  Pulse: 71 78 68 55  Temp:    97.9 F (36.6 C)  TempSrc:   Oral Oral  Resp: 14 21 19 11   Height:      Weight:      SpO2:    98%   Weight change:   Intake/Output Summary (Last 24 hours) at 11/03/14 0801 Last data filed at 11/03/14 0600  Gross per 24 hour  Intake 278.01 ml  Output    600 ml  Net -321.99 ml    General: Alert, awake, oriented x3, in no acute distress Neck:  JVP is normal Heart: Regular rate and rhythm, without murmurs, rubs, gallops.  Lungs: Clear to auscultation.  No rales or wheezes. Exemities:  No edema.   Neuro: Grossly intact, nonfocal.  Tele:  SR  13 bet NSVT    Lab Results: Results for orders placed or performed during the hospital encounter of 11/02/14 (from the past 24 hour(s))  Troponin I     Status: Abnormal   Collection Time: 11/02/14  5:52 PM  Result Value Ref Range   Troponin I 0.05 (H) <0.031 ng/mL  CBC     Status: Abnormal   Collection Time: 11/02/14  5:53 PM  Result Value Ref Range   WBC 11.2 (H) 4.0 - 10.5 K/uL   RBC 4.08 3.87 - 5.11 MIL/uL   Hemoglobin 12.6 12.0 - 15.0 g/dL   HCT 38.4 36.0 - 46.0 %   MCV 94.1 78.0 - 100.0 fL   MCH 30.9 26.0 - 34.0 pg   MCHC 32.8 30.0 - 36.0 g/dL   RDW 13.3 11.5 - 15.5 %   Platelets 240 150 - 400 K/uL  Basic metabolic panel     Status: Abnormal   Collection Time: 11/02/14  5:53 PM  Result Value Ref Range   Sodium 138 135 - 145 mmol/L   Potassium 3.4 (L) 3.5 - 5.1 mmol/L   Chloride 107 101 - 111 mmol/L   CO2 21 (L) 22 - 32 mmol/L   Glucose, Bld 100 (H) 65 - 99 mg/dL   BUN 16 6 - 20 mg/dL   Creatinine, Ser 0.96 0.44 - 1.00 mg/dL   Calcium 8.4 (L) 8.9 - 10.3 mg/dL   GFR calc non Af Amer >60 >60 mL/min   GFR calc Af Amer >60 >60 mL/min   Anion gap 10 5 - 15  I-stat troponin, ED  (not at [facility], [facility])     Status: Abnormal   Collection  Time: 11/02/14  5:58 PM  Result Value Ref Range   Troponin i, poc 0.09 (HH) 0.00 - 0.08 ng/mL   Comment NOTIFIED PHYSICIAN    Comment 3          Troponin I (q 6hr x 3)     Status: Abnormal   Collection Time: 11/02/14 11:58 PM  Result Value Ref Range   Troponin I 0.45 (H) <0.031 ng/mL  Hepatic function panel     Status: Abnormal   Collection Time: 11/02/14 11:58 PM  Result Value Ref Range   Total Protein 7.3 6.5 - 8.1 g/dL   Albumin 3.7 3.5 - 5.0 g/dL   AST 22 15 - 41 U/L   ALT 18 14 - 54 U/L   Alkaline Phosphatase 74 38 - 126 U/L   Total Bilirubin 1.2 0.3 - 1.2 mg/dL  Bilirubin, Direct 0.2 0.1 - 0.5 mg/dL   Indirect Bilirubin 1.0 (H) 0.3 - 0.9 mg/dL  Lipase, blood     Status: Abnormal   Collection Time: 11/02/14 11:58 PM  Result Value Ref Range   Lipase 18 (L) 22 - 51 U/L  MRSA PCR Screening     Status: None   Collection Time: 11/03/14  2:09 AM  Result Value Ref Range   MRSA by PCR NEGATIVE NEGATIVE  Urine rapid drug screen (hosp performed)     Status: Abnormal   Collection Time: 11/03/14  3:31 AM  Result Value Ref Range   Opiates POSITIVE (A) NONE DETECTED   Cocaine NONE DETECTED NONE DETECTED   Benzodiazepines POSITIVE (A) NONE DETECTED   Amphetamines NONE DETECTED NONE DETECTED   Tetrahydrocannabinol NONE DETECTED NONE DETECTED   Barbiturates NONE DETECTED NONE DETECTED  CBC with Differential/Platelet     Status: Abnormal   Collection Time: 11/03/14  7:20 AM  Result Value Ref Range   WBC 9.5 4.0 - 10.5 K/uL   RBC 3.55 (L) 3.87 - 5.11 MIL/uL   Hemoglobin 11.2 (L) 12.0 - 15.0 g/dL   HCT 34.1 (L) 36.0 - 46.0 %   MCV 96.1 78.0 - 100.0 fL   MCH 31.5 26.0 - 34.0 pg   MCHC 32.8 30.0 - 36.0 g/dL   RDW 13.4 11.5 - 15.5 %   Platelets 202 150 - 400 K/uL   Neutrophils Relative % 57 43 - 77 %   Neutro Abs 5.4 1.7 - 7.7 K/uL   Lymphocytes Relative 35 12 - 46 %   Lymphs Abs 3.3 0.7 - 4.0 K/uL   Monocytes Relative 6 3 - 12 %   Monocytes Absolute 0.6 0.1 - 1.0 K/uL    Eosinophils Relative 2 0 - 5 %   Eosinophils Absolute 0.2 0.0 - 0.7 K/uL   Basophils Relative 0 0 - 1 %   Basophils Absolute 0.0 0.0 - 0.1 K/uL    Studies/Results: Dg Chest 2 View  11/02/2014   CLINICAL DATA:  Left-sided chest pain this morning  EXAM: CHEST  2 VIEW  COMPARISON:  01/22/2010  FINDINGS: Cardiac shadow is within normal limits. The lungs are well aerated bilaterally. Minimal right middle lobe atelectasis is seen. No effusion is noted. No bony abnormality is seen.  IMPRESSION: Right middle lobe atelectasis.   Electronically Signed   By: Inez Catalina M.D.   On: 11/02/2014 18:16   Ct Angio Chest Pe W/cm &/or Wo Cm  11/02/2014   CLINICAL DATA:  Acute onset of neck and shoulder pain. Chest pain and shortness of breath. Initial encounter.  EXAM: CT ANGIOGRAPHY CHEST WITH CONTRAST  TECHNIQUE: Multidetector CT imaging of the chest was performed using the standard protocol during bolus administration of intravenous contrast. Multiplanar CT image reconstructions and MIPs were obtained to evaluate the vascular anatomy.  CONTRAST:  129mL OMNIPAQUE IOHEXOL 350 MG/ML SOLN  COMPARISON:  Chest radiograph performed earlier today at 5:59 p.m.  FINDINGS: There is no evidence of pulmonary embolus.  Mild bilateral atelectasis is noted. The lungs are otherwise clear. There is no evidence of significant focal consolidation, pleural effusion or pneumothorax. No masses are identified; no abnormal focal contrast enhancement is seen.  The mediastinum is unremarkable in appearance. No mediastinal lymphadenopathy is seen. The great vessels are grossly unremarkable. The patient is status post thyroidectomy. No pericardial effusion is identified. No axillary lymphadenopathy is seen.  The visualized portions of the liver and spleen are unremarkable.  No acute  osseous abnormalities are seen.  Review of the MIP images confirms the above findings.  IMPRESSION: 1. No evidence of pulmonary embolus. 2. Mild bilateral atelectasis  noted.  Lungs otherwise clear.   Electronically Signed   By: Garald Balding M.D.   On: 11/02/2014 21:01   Ct Cervical Spine Wo Contrast  11/02/2014   CLINICAL DATA:  Neck and chest pain  EXAM: CT CERVICAL SPINE WITHOUT CONTRAST  TECHNIQUE: Multidetector CT imaging of the cervical spine was performed without intravenous contrast. Multiplanar CT image reconstructions were also generated.  COMPARISON:  01/22/2010  FINDINGS: Seven cervical segments are well visualized. Vertebral body height is well maintained. Facet hypertrophic changes are noted. No acute facet abnormality or acute fracture is seen. Mild disco-osteophytic changes are seen without significant central canal stenosis. Surrounding soft tissues and visualized lung apices are within normal limits.  IMPRESSION: Degenerative changes without acute abnormality.   Electronically Signed   By: Inez Catalina M.D.   On: 11/02/2014 20:50    Medications: Reviewed   @PROBHOSP @  1.  NSTEMI  Patinet notes increased dyspnea, fatigue recently  Chest pressure yesterday  NOw gone. SL elevation in Trop  EKG from yesterday worrisome with diffuse anterolateral depression Will repeat now that pain free. Echo pending.   BP is marginal  Not able to add medicines Plan for cath tomorrow  Risks and benefits explained  Patient understands and agrees to proceed.   2.  HL  On high dose statin  3.  Tob  Counselled on cessation.      LOS: 1 day   Dorris Carnes 11/03/2014, 8:01 AM

## 2014-11-03 NOTE — Progress Notes (Signed)
eLink Physician-Brief Progress Note Patient Name: Brittney Bowen DOB: [birth year] MRN: [id number]   Date of Service  11/03/2014  HPI/Events of Note  Admitted with syncope and chest pain with NSTEMI.   eICU Interventions  No additional elink interventions at this time.     Intervention Category Major Interventions: Other:  Kursten Kruk 11/03/2014, 2:58 AM

## 2014-11-03 NOTE — Progress Notes (Signed)
[facility] for Heparin Indication: ACS  Allergies  Allergen Reactions  . Blue Dyes (Parenteral) Anaphylaxis    Blueberries - anaphylaxis     Patient Measurements: Height: 5\' 9"  (175.3 cm) Weight: 211 lb 10.3 oz (96 kg) IBW/kg (Calculated) : 66.2 Heparin Dosing Weight: 86.7 kg  Vital Signs: Temp: 98.5 F (36.9 C) (05/30 1930) Temp Source: Oral (05/30 1930) BP: 102/53 mmHg (05/30 2100) Pulse Rate: 69 (05/30 2100)  Labs:  Recent Labs  11/02/14 1753  11/03/14 0720 11/03/14 1345 11/03/14 1840 11/03/14 2148  HGB 12.6  --  11.2*  --   --   --   HCT 38.4  --  34.1*  --   --   --   PLT 240  --  202  --   --   --   HEPARINUNFRC  --   --   --  0.16*  --  0.33  CREATININE 0.96  --   --   --   --   --   TROPONINI  --   < > 6.37* 17.76* 19.75*  --   < > = values in this interval not displayed.  Estimated Creatinine Clearance: 78.8 mL/min (by C-G formula based on Cr of 0.96).   Assessment: 59 y.o. female with chest pain for heparin  Goal of Therapy:  Heparin level 0.3-0.7 units/ml Monitor platelets by anticoagulation protocol: Yes   Plan:  Continue Heparin at current rate  Follow-up am labs.  Phillis Knack, PharmD, BCPS   11/03/2014 11:06 PM

## 2014-11-03 NOTE — Progress Notes (Signed)
[facility] for Heparin Indication: chest pain/ACS  Allergies  Allergen Reactions  . Blue Dyes (Parenteral)     Blueberries - anaphylaxis     Patient Measurements: Height: 5\' 9"  (175.3 cm) Weight: 206 lb 6.4 oz (93.622 kg) IBW/kg (Calculated) : 66.2 Heparin Dosing Weight:   Vital Signs: Temp: 97.6 F (36.4 C) (05/30 0020) Temp Source: Oral (05/30 0020) BP: 113/62 mmHg (05/30 0020) Pulse Rate: 71 (05/30 0020)  Labs:  Recent Labs  11/02/14 1752 11/02/14 1753 11/02/14 2358  HGB  --  12.6  --   HCT  --  38.4  --   PLT  --  240  --   CREATININE  --  0.96  --   TROPONINI 0.05*  --  0.45*    Estimated Creatinine Clearance: 77.8 mL/min (by C-G formula based on Cr of 0.96).   Medical History: Past Medical History  Diagnosis Date  . Thyroid disease     Medications:  Prescriptions prior to admission  Medication Sig Dispense Refill Last Dose  . HYDROcodone-acetaminophen (NORCO) 5-325 MG per tablet Take 2 tablets by mouth every 4 (four) hours as needed. 10 tablet 0   . levothyroxine (SYNTHROID, LEVOTHROID) 100 MCG tablet Take 1 tablet (100 mcg total) by mouth daily before breakfast. 30 tablet 0   . methocarbamol (ROBAXIN) 500 MG tablet Take 1 tablet (500 mg total) by mouth 2 (two) times daily. 20 tablet 0   . naproxen (NAPROSYN) 500 MG tablet Take 1 tablet (500 mg total) by mouth 2 (two) times daily. 30 tablet 0   . traMADol (ULTRAM) 50 MG tablet Take 50 mg by mouth every 6 (six) hours as needed.       Assessment: 59 y.o. female with chest pain/elevated cardiac markers, for heparin  Goal of Therapy:  Heparin level 0.3-0.7 units/ml Monitor platelets by anticoagulation protocol: Yes   Plan:  Heparin 4000 units IV bolus, then start heparin 1100 units/hr Check heparin level in 6 hours.   Lucilia Yanni, [place] Curb 11/03/2014,1:02 AM

## 2014-11-03 NOTE — Progress Notes (Signed)
Patient being transferred to [facility] with myself and Rapid Response RN. Report called. Husband at bedside.

## 2014-11-03 NOTE — Progress Notes (Signed)
ANTICOAGULATION CONSULT NOTE - Follow Up Consult  Pharmacy Consult for Heparin Indication: ACS  Allergies  Allergen Reactions  . Blue Dyes (Parenteral)     Blueberries - anaphylaxis     Patient Measurements: Height: 5\' 9"  (175.3 cm) Weight: 211 lb 10.3 oz (96 kg) IBW/kg (Calculated) : 66.2 Heparin Dosing Weight: 86.7 kg  Vital Signs: Temp: 98.2 F (36.8 C) (05/30 1200) Temp Source: Oral (05/30 1200) BP: 94/57 mmHg (05/30 1200) Pulse Rate: 63 (05/30 1200)  Labs:  Recent Labs  11/02/14 1752 11/02/14 1753 11/02/14 2358 11/03/14 0720  HGB  --  12.6  --  11.2*  HCT  --  38.4  --  34.1*  PLT  --  240  --  202  CREATININE  --  0.96  --   --   TROPONINI 0.05*  --  0.45* 6.37*    Estimated Creatinine Clearance: 78.8 mL/min (by C-G formula based on Cr of 0.96).   Medications:  Scheduled:  . antiseptic oral rinse  7 mL Mouth Rinse BID  . [START ON 11/04/2014] aspirin EC  81 mg Oral Daily  . atorvastatin  80 mg Oral q1800  . levothyroxine  100 mcg Oral QAC breakfast  . methocarbamol  500 mg Oral BID  . metoprolol tartrate  12.5 mg Oral BID  . ticagrelor  90 mg Oral BID   Infusions:  . sodium chloride 50 mL/hr at 11/03/14 0800  . heparin 1,100 Units/hr (11/03/14 0800)  . nitroGLYCERIN 20 mcg/min (11/03/14 0800)    Assessment: CC: Brittney Bowen is an 59 y.o. AA female admitted on 11/02/2014 presenting with CP.  Troponin levels mildly elevated and EKG revealed ST depression.  Patient admitted for further workup for NSTEMI.  Pharmacy has been consulted to dose heparin in the setting of possible NSTEMI.    CT Chest neg PE. CBC wnl.  No reports of bleeding at this time. Scr wnl, CrCl 75-80 mL/min  Heparin Dosing Weight: 86.7 kg  Heparin level today is SUBtherapeutic at 0.16.  No reports of bleeding at this time.    Goal of Therapy:  Heparin level 0.3-0.7 units/ml Monitor platelets by anticoagulation protocol: Yes   Plan:  - Heparin 2,000 unit bolus IV x 1 -  Increase heparin infusion rate to 1400 units/hr - Heparin level in 6 hours - Daily heparin level and CBC - Monitor for signs and symptoms of bleeding  Hassie Bruce, Pharm. D. Clinical Pharmacy Resident Pager: [phone] Ph: [phone] 11/03/2014 2:06 PM

## 2014-11-03 NOTE — Progress Notes (Signed)
  RN called.  Patient developed recurrent chest pain getting up to the BR. BP soft and NTG gtt could not be further titrated. Reviewed ECG.  No change from prior. Will get another round of serial Troponins. Morphine 2-4 mg every 1-2 hours prn for pain started. Continue to monitor. Richardson Dopp, PA-C   11/03/2014 5:41 PM

## 2014-11-04 ENCOUNTER — Encounter (HOSPITAL_COMMUNITY): Payer: Self-pay | Admitting: Interventional Cardiology

## 2014-11-04 ENCOUNTER — Encounter (HOSPITAL_COMMUNITY)
Admission: EM | Disposition: A | Discharge status: Home / Self Care | Payer: Medicaid Other | Source: Home / Self Care | Attending: Internal Medicine

## 2014-11-04 DIAGNOSIS — E785 Hyperlipidemia, unspecified: Secondary | ICD-10-CM

## 2014-11-04 DIAGNOSIS — I2510 Atherosclerotic heart disease of native coronary artery without angina pectoris: Secondary | ICD-10-CM

## 2014-11-04 DIAGNOSIS — D649 Anemia, unspecified: Secondary | ICD-10-CM

## 2014-11-04 HISTORY — PX: CARDIAC CATHETERIZATION: SHX172

## 2014-11-04 LAB — TROPONIN I
Troponin I: 13.71 ng/mL (ref <0.031)
Troponin I: 10.17 ng/mL (ref <0.031)

## 2014-11-04 LAB — BASIC METABOLIC PANEL
Anion gap: 14 (ref 5–15)
BUN: 6 mg/dL (ref 6–20)
CHLORIDE: 109 mmol/L (ref 101–111)
CO2: 20 mmol/L — AB (ref 22–32)
CREATININE: 0.89 mg/dL (ref 0.44–1.00)
Calcium: 7.7 mg/dL — ABNORMAL LOW (ref 8.9–10.3)
GFR calc Af Amer: >60 mL/min (ref >60)
GFR calc non Af Amer: >60 mL/min (ref >60)
GLUCOSE: 90 mg/dL (ref 65–99)
Potassium: 3.5 mmol/L (ref 3.5–5.1)
Sodium: 143 mmol/L (ref 135–145)

## 2014-11-04 LAB — CBC
HEMATOCRIT: 32.6 % — AB (ref 36.0–46.0)
HEMOGLOBIN: 10.6 g/dL — AB (ref 12.0–15.0)
MCH: 31.1 pg (ref 26.0–34.0)
MCHC: 32.5 g/dL (ref 30.0–36.0)
MCV: 95.6 fL (ref 78.0–100.0)
Platelets: 170 10*3/uL (ref 150–400)
RBC: 3.41 MIL/uL — AB (ref 3.87–5.11)
RDW: 13.6 % (ref 11.5–15.5)
WBC: 8.3 10*3/uL (ref 4.0–10.5)

## 2014-11-04 LAB — LIPID PANEL
CHOL/HDL RATIO: 5.2 ratio
Cholesterol: 167 mg/dL (ref 0–200)
HDL: 32 mg/dL — AB (ref >40)
LDL CALC: 113 mg/dL — AB (ref 0–99)
Triglycerides: 112 mg/dL (ref <150)
VLDL: 22 mg/dL (ref 0–40)

## 2014-11-04 LAB — POCT ACTIVATED CLOTTING TIME: ACTIVATED CLOTTING TIME: 288 s

## 2014-11-04 LAB — HEPARIN LEVEL (UNFRACTIONATED): HEPARIN UNFRACTIONATED: 0.41 [IU]/mL (ref 0.30–0.70)

## 2014-11-04 LAB — PROTIME-INR
INR: 1.2 (ref 0.00–1.49)
PROTHROMBIN TIME: 15.4 s — AB (ref 11.6–15.2)

## 2014-11-04 SURGERY — LEFT HEART CATH AND CORONARY ANGIOGRAPHY
Anesthesia: LOCAL | Laterality: Right

## 2014-11-04 MED ORDER — SODIUM CHLORIDE 0.9 % IJ SOLN
3.0000 mL | Freq: Two times a day (BID) | INTRAMUSCULAR | Status: DC
  Administered 2014-11-04 – 2014-11-05 (×3): 3 mL via INTRAVENOUS

## 2014-11-04 MED ORDER — VERAPAMIL HCL 2.5 MG/ML IV SOLN
INTRAVENOUS | Status: AC
  Filled 2014-11-04: qty 2

## 2014-11-04 MED ORDER — NITROGLYCERIN 1 MG/10 ML FOR IR/CATH LAB
INTRA_ARTERIAL | Status: AC
  Filled 2014-11-04: qty 10

## 2014-11-04 MED ORDER — ASPIRIN 81 MG PO CHEW
81.0000 mg | CHEWABLE_TABLET | Freq: Every day | ORAL | Status: DC
  Administered 2014-11-05: 81 mg via ORAL
  Filled 2014-11-04: qty 1

## 2014-11-04 MED ORDER — FENTANYL CITRATE (PF) 100 MCG/2ML IJ SOLN
INTRAMUSCULAR | Status: DC | PRN
  Administered 2014-11-04: 25 ug via INTRAVENOUS
  Administered 2014-11-04: 50 ug via INTRAVENOUS
  Administered 2014-11-04 (×2): 25 ug via INTRAVENOUS
  Administered 2014-11-04: 50 ug via INTRAVENOUS

## 2014-11-04 MED ORDER — SODIUM CHLORIDE 0.9 % WEIGHT BASED INFUSION
1.0000 mL/kg/h | INTRAVENOUS | Status: AC
  Administered 2014-11-04: 1 mL/kg/h via INTRAVENOUS

## 2014-11-04 MED ORDER — FENTANYL CITRATE (PF) 100 MCG/2ML IJ SOLN
INTRAMUSCULAR | Status: AC
  Filled 2014-11-04: qty 2

## 2014-11-04 MED ORDER — MIDAZOLAM HCL 2 MG/2ML IJ SOLN
INTRAMUSCULAR | Status: AC
  Filled 2014-11-04: qty 2

## 2014-11-04 MED ORDER — NITROGLYCERIN 1 MG/10 ML FOR IR/CATH LAB
INTRA_ARTERIAL | Status: DC | PRN
  Administered 2014-11-04: 200 ug via INTRACORONARY
  Administered 2014-11-04: 200 ug via INTRA_ARTERIAL

## 2014-11-04 MED ORDER — SODIUM CHLORIDE 0.9 % IV SOLN: 250.0000 mL | INTRAVENOUS | Status: DC | PRN

## 2014-11-04 MED ORDER — HEPARIN (PORCINE) IN NACL 2-0.9 UNIT/ML-% IJ SOLN
INTRAMUSCULAR | Status: AC
  Filled 2014-11-04: qty 1000

## 2014-11-04 MED ORDER — HEPARIN SODIUM (PORCINE) 1000 UNIT/ML IJ SOLN
INTRAMUSCULAR | Status: AC
  Filled 2014-11-04: qty 1

## 2014-11-04 MED ORDER — IOHEXOL 350 MG/ML SOLN
INTRAVENOUS | Status: DC | PRN
  Administered 2014-11-04: 125 mL via INTRA_ARTERIAL

## 2014-11-04 MED ORDER — HEPARIN SODIUM (PORCINE) 1000 UNIT/ML IJ SOLN
INTRAMUSCULAR | Status: DC | PRN
  Administered 2014-11-04: 1000 [IU] via INTRAVENOUS
  Administered 2014-11-04: 5000 [IU] via INTRAVENOUS
  Administered 2014-11-04: 4000 [IU] via INTRAVENOUS

## 2014-11-04 MED ORDER — SODIUM CHLORIDE 0.9 % IJ SOLN: 3.0000 mL | INTRAMUSCULAR | Status: DC | PRN

## 2014-11-04 MED ORDER — MIDAZOLAM HCL 2 MG/2ML IJ SOLN
INTRAMUSCULAR | Status: DC | PRN
  Administered 2014-11-04 (×3): 1 mg via INTRAVENOUS

## 2014-11-04 MED ORDER — TICAGRELOR 90 MG PO TABS
90.0000 mg | ORAL_TABLET | Freq: Two times a day (BID) | ORAL | Status: DC
  Administered 2014-11-04 – 2014-11-05 (×2): 90 mg via ORAL
  Filled 2014-11-04 (×3): qty 1

## 2014-11-04 MED ORDER — ACETAMINOPHEN 325 MG PO TABS
650.0000 mg | ORAL_TABLET | ORAL | Status: DC | PRN
  Administered 2014-11-04 (×2): 650 mg via ORAL
  Filled 2014-11-04 (×2): qty 2

## 2014-11-04 MED ORDER — LIDOCAINE HCL (PF) 1 % IJ SOLN
INTRAMUSCULAR | Status: AC
  Filled 2014-11-04: qty 30

## 2014-11-04 MED ORDER — VERAPAMIL HCL 2.5 MG/ML IV SOLN
INTRAVENOUS | Status: DC | PRN
  Administered 2014-11-04: 11:00:00 via INTRA_ARTERIAL

## 2014-11-04 MED ORDER — ONDANSETRON HCL 4 MG/2ML IJ SOLN: 4.0000 mg | Freq: Four times a day (QID) | INTRAMUSCULAR | Status: DC | PRN

## 2014-11-04 SURGICAL SUPPLY — 24 items
BALLN TREK RX 2.5X12 (BALLOONS) ×3
BALLN ~~LOC~~ TREK RX 4.0X12 (BALLOONS) ×3
BALLOON TREK RX 2.5X12 (BALLOONS) ×2 IMPLANT
BALLOON ~~LOC~~ TREK RX 4.0X12 (BALLOONS) ×2 IMPLANT
CATH INFINITI 5 FR JL3.5 (CATHETERS) ×3 IMPLANT
CATH INFINITI 5FR ANG PIGTAIL (CATHETERS) ×3 IMPLANT
CATH INFINITI 5FR MULTPACK ANG (CATHETERS) IMPLANT
CATH INFINITI JR4 5F (CATHETERS) ×3 IMPLANT
DEVICE RAD COMP TR BAND LRG (VASCULAR PRODUCTS) ×3 IMPLANT
GLIDESHEATH SLEND SS 6F .021 (SHEATH) ×3 IMPLANT
GUIDE CATH RUNWAY 6FR CLS3 (CATHETERS) ×3 IMPLANT
KIT ENCORE 26 ADVANTAGE (KITS) ×3 IMPLANT
KIT HEART LEFT (KITS) ×3 IMPLANT
PACK CARDIAC CATHETERIZATION (CUSTOM PROCEDURE TRAY) ×3 IMPLANT
SHEATH PINNACLE 5F 10CM (SHEATH) IMPLANT
STENT XIENCE ALPINE RX 4.0X18 (Permanent Stent) ×3 IMPLANT
SYR MEDRAD MARK V 150ML (SYRINGE) ×3 IMPLANT
TRANSDUCER W/STOPCOCK (MISCELLANEOUS) ×3 IMPLANT
TUBING CIL FLEX 10 FLL-RA (TUBING) ×3 IMPLANT
VALVE GUARDIAN II ~~LOC~~ HEMO (MISCELLANEOUS) ×3 IMPLANT
WIRE ASAHI PROWATER 180CM (WIRE) ×3 IMPLANT
WIRE EMERALD 3MM-J .035X150CM (WIRE) IMPLANT
WIRE HI TORQ VERSACORE-J 145CM (WIRE) ×3 IMPLANT
WIRE SAFE-T 1.5MM-J .035X260CM (WIRE) ×3 IMPLANT

## 2014-11-04 NOTE — Progress Notes (Signed)
    Subjective:  No chest pain since last pm. Comfortable in bed. No dyspnea.  Objective:  Vital Signs in the last 24 hours: Temp:  [98 F (36.7 C)-98.6 F (37 C)] 98.6 F (37 C) (05/31 0803) Pulse Rate:  [55-86] 69 (05/31 0803) Resp:  [15-33] 33 (05/31 0803) BP: (82-120)/(42-88) 117/86 mmHg (05/31 0803) SpO2:  [96 %-99 %] 99 % (05/31 0803)  Intake/Output from previous day: 05/30 0701 - 05/31 0700 In: 2150.1 [P.O.:715; I.V.:1435.1] Out: 1325 [Urine:1325]  Physical Exam: Pt is alert and oriented, NAD HEENT: normal Neck: JVP - normal Lungs: CTA bilaterally CV: RRR without murmur or gallop Abd: soft, NT, Positive BS, no hepatomegaly Ext: no C/C/E, distal pulses intact and equal Skin: warm/dry no rash   Lab Results:  Recent Labs  11/03/14 0720 11/04/14 0600  WBC 9.5 8.3  HGB 11.2* 10.6*  PLT 202 170    Recent Labs  11/02/14 1753 11/04/14 0600  NA 138 143  K 3.4* 3.5  CL 107 109  CO2 21* 20*  GLUCOSE 100* 90  BUN 16 6  CREATININE 0.96 0.89    Recent Labs  11/03/14 2350 11/04/14 0600  TROPONINI 13.71* 10.17*    Cardiac Studies: Echo pending  Assessment/Plan:  1. NSTEMI - on ASA and ticagrelor, awaiting cath. Note has had recurrent CP in hospital with soft BP making med titration difficult. Off NTG because of low BP. Has been maintained on heparin. Troponin trending down. No planned surgeries next 12 months and no bleeding problems. Risks, potential benefits of cath and PCI reviewed with patient and husband.  2. Lipids - will check, continue high-intensity statin  3. Tobacco - needs cessation  4. Anemia - suspect phlebotomy, hemodilution no signs of active bleeding  Dispo: pending cath results  Sherren Mocha, M.D. 11/04/2014, 9:45 AM

## 2014-11-04 NOTE — Interval H&P Note (Signed)
Cath Lab Visit (complete for each Cath Lab visit)  Clinical Evaluation Leading to the Procedure:   ACS: Yes.    Non-ACS:    Anginal Classification: CCS IV  Anti-ischemic medical therapy: Minimal Therapy (1 class of medications)  Non-Invasive Test Results: No non-invasive testing performed  Prior CABG: No previous CABG   TIMI Score  Patient Information:  TIMI Score is 4   UA/NSTEMI and intermediate-risk features (e.g., TIMI score 3-4) for short-term risk of death or nonfatal MI  Revascularization of the presumed culprit artery   A (8)  Indication: 10; Score: 8    History and Physical Interval Note:  0/23/3435 68:61 AM  Brittney Bowen  has presented today for surgery, with the diagnosis of unstable angina  The various methods of treatment have been discussed with the patient and family. After consideration of risks, benefits and other options for treatment, the patient has consented to  Procedure(s): Left Heart Cath and Coronary Angiography (N/A) as a surgical intervention .  The patient's history has been reviewed, patient examined, no change in status, stable for surgery.  I have reviewed the patient's chart and labs.  Questions were answered to the patient's satisfaction.     Brittney Bowen S.

## 2014-11-04 NOTE — Progress Notes (Signed)
ANTICOAGULATION CONSULT NOTE - Follow Up Consult  Pharmacy Consult for Heparin Indication: ACS  Allergies  Allergen Reactions  . Blue Dyes (Parenteral) Anaphylaxis    Blueberries - anaphylaxis     Patient Measurements: Height: 5\' 9"  (175.3 cm) Weight: 211 lb 10.3 oz (96 kg) IBW/kg (Calculated) : 66.2 Heparin Dosing Weight: 86.7 kg  Vital Signs: Temp: 98 F (36.7 C) (05/31 0400) Temp Source: Oral (05/31 0400) BP: 120/72 mmHg (05/31 0700) Pulse Rate: 67 (05/31 0700)  Labs:  Recent Labs  11/02/14 1753  11/03/14 0720 11/03/14 1345 11/03/14 1840 11/03/14 2148 11/03/14 2350 11/04/14 0600  HGB 12.6  --  11.2*  --   --   --   --  10.6*  HCT 38.4  --  34.1*  --   --   --   --  32.6*  PLT 240  --  202  --   --   --   --  170  HEPARINUNFRC  --   --   --  0.16*  --  0.33  --  0.41  CREATININE 0.96  --   --   --   --   --   --   --   TROPONINI  --   < > 6.37* 17.76* 19.75*  --  13.71*  --   < > = values in this interval not displayed.  Estimated Creatinine Clearance: 78.8 mL/min (by C-G formula based on Cr of 0.96).   Medications:  Scheduled:  . antiseptic oral rinse  7 mL Mouth Rinse BID  . aspirin EC  81 mg Oral Daily  . atorvastatin  80 mg Oral q1800  . levothyroxine  100 mcg Oral QAC breakfast  . methocarbamol  500 mg Oral BID  . metoprolol tartrate  12.5 mg Oral BID  . sodium chloride  3 mL Intravenous Q12H  . ticagrelor  90 mg Oral BID   Infusions:  . sodium chloride 50 mL/hr at 11/04/14 0500  . sodium chloride    . heparin 1,400 Units/hr (11/03/14 1847)  . nitroGLYCERIN Stopped (11/04/14 0200)    Assessment: CC: Myrl ESSENSE BOUSQUET is an 59 y.o. AA female admitted on 11/02/2014 presenting with CP.  Troponin levels mildly elevated and EKG revealed ST depression.  Patient admitted for further workup for NSTEMI.  Pharmacy has been consulted to dose heparin in the setting of possible NSTEMI.    Heparin Dosing Weight: 86.7 kg  Heparin level today is Therapeutic  at 0.41 on 1400 units/hr.  Hgb 10.6, plts 170.  Troponins remain elevated but appear to be trending down. No reports of bleeding at this time.  Per Cards notes, patient is scheduled to undergo cath today.  Goal of Therapy:  Heparin level 0.3-0.7 units/ml Monitor platelets by anticoagulation protocol: Yes   Plan:  - Continue heparin 1400 units/hr - F/U plans for heparin post cath - Daily heparin level and CBC - Monitor for signs and symptoms of bleeding  Theron Arista, PharmD Clinical Pharmacist - Resident Pager: [phone] 5/31/20167:20 AM

## 2014-11-04 NOTE — H&P (View-Only) (Signed)
    Subjective:  No chest pain since last pm. Comfortable in bed. No dyspnea.  Objective:  Vital Signs in the last 24 hours: Temp:  [98 F (36.7 C)-98.6 F (37 C)] 98.6 F (37 C) (05/31 0803) Pulse Rate:  [55-86] 69 (05/31 0803) Resp:  [15-33] 33 (05/31 0803) BP: (82-120)/(42-88) 117/86 mmHg (05/31 0803) SpO2:  [96 %-99 %] 99 % (05/31 0803)  Intake/Output from previous day: 05/30 0701 - 05/31 0700 In: 2150.1 [P.O.:715; I.V.:1435.1] Out: 1325 [Urine:1325]  Physical Exam: Pt is alert and oriented, NAD HEENT: normal Neck: JVP - normal Lungs: CTA bilaterally CV: RRR without murmur or gallop Abd: soft, NT, Positive BS, no hepatomegaly Ext: no C/C/E, distal pulses intact and equal Skin: warm/dry no rash   Lab Results:  Recent Labs  11/03/14 0720 11/04/14 0600  WBC 9.5 8.3  HGB 11.2* 10.6*  PLT 202 170    Recent Labs  11/02/14 1753 11/04/14 0600  NA 138 143  K 3.4* 3.5  CL 107 109  CO2 21* 20*  GLUCOSE 100* 90  BUN 16 6  CREATININE 0.96 0.89    Recent Labs  11/03/14 2350 11/04/14 0600  TROPONINI 13.71* 10.17*    Cardiac Studies: Echo pending  Assessment/Plan:  1. NSTEMI - on ASA and ticagrelor, awaiting cath. Note has had recurrent CP in hospital with soft BP making med titration difficult. Off NTG because of low BP. Has been maintained on heparin. Troponin trending down. No planned surgeries next 12 months and no bleeding problems. Risks, potential benefits of cath and PCI reviewed with patient and husband.  2. Lipids - will check, continue high-intensity statin  3. Tobacco - needs cessation  4. Anemia - suspect phlebotomy, hemodilution no signs of active bleeding  Dispo: pending cath results  Sherren Mocha, M.D. 11/04/2014, 9:45 AM

## 2014-11-05 DIAGNOSIS — E785 Hyperlipidemia, unspecified: Secondary | ICD-10-CM | POA: Diagnosis present

## 2014-11-05 DIAGNOSIS — F17200 Nicotine dependence, unspecified, uncomplicated: Secondary | ICD-10-CM | POA: Diagnosis present

## 2014-11-05 DIAGNOSIS — Z9861 Coronary angioplasty status: Secondary | ICD-10-CM

## 2014-11-05 DIAGNOSIS — I2510 Atherosclerotic heart disease of native coronary artery without angina pectoris: Secondary | ICD-10-CM

## 2014-11-05 DIAGNOSIS — I214 Non-ST elevation (NSTEMI) myocardial infarction: Principal | ICD-10-CM

## 2014-11-05 LAB — BASIC METABOLIC PANEL
Anion gap: 10 (ref 5–15)
BUN: <5 mg/dL — ABNORMAL LOW (ref 6–20)
CO2: 24 mmol/L (ref 22–32)
Calcium: 8 mg/dL — ABNORMAL LOW (ref 8.9–10.3)
Chloride: 107 mmol/L (ref 101–111)
Creatinine, Ser: 0.91 mg/dL (ref 0.44–1.00)
GFR calc non Af Amer: >60 mL/min (ref >60)
Glucose, Bld: 93 mg/dL (ref 65–99)
POTASSIUM: 3.2 mmol/L — AB (ref 3.5–5.1)
SODIUM: 141 mmol/L (ref 135–145)

## 2014-11-05 LAB — CBC
HCT: 34.7 % — ABNORMAL LOW (ref 36.0–46.0)
Hemoglobin: 11.2 g/dL — ABNORMAL LOW (ref 12.0–15.0)
MCH: 30.8 pg (ref 26.0–34.0)
MCHC: 32.3 g/dL (ref 30.0–36.0)
MCV: 95.3 fL (ref 78.0–100.0)
Platelets: 180 10*3/uL (ref 150–400)
RBC: 3.64 MIL/uL — ABNORMAL LOW (ref 3.87–5.11)
RDW: 13.6 % (ref 11.5–15.5)
WBC: 7.4 10*3/uL (ref 4.0–10.5)

## 2014-11-05 MED ORDER — ACETAMINOPHEN 325 MG PO TABS: 650.0000 mg | ORAL_TABLET | ORAL | Status: DC | PRN

## 2014-11-05 MED ORDER — ASPIRIN 81 MG PO CHEW: 81.0000 mg | CHEWABLE_TABLET | Freq: Every day | ORAL | Status: AC

## 2014-11-05 MED ORDER — POTASSIUM CHLORIDE CRYS ER 20 MEQ PO TBCR
40.0000 meq | EXTENDED_RELEASE_TABLET | Freq: Once | ORAL | Status: AC
  Administered 2014-11-05: 40 meq via ORAL

## 2014-11-05 MED ORDER — METOPROLOL TARTRATE 25 MG PO TABS: 25.0000 mg | ORAL_TABLET | Freq: Two times a day (BID) | ORAL | Status: DC

## 2014-11-05 MED ORDER — ATORVASTATIN CALCIUM 80 MG PO TABS: 80.0000 mg | ORAL_TABLET | Freq: Every day | ORAL | Status: DC

## 2014-11-05 MED ORDER — NITROGLYCERIN 0.4 MG SL SUBL: 0.4000 mg | SUBLINGUAL_TABLET | SUBLINGUAL | Status: AC | PRN

## 2014-11-05 MED ORDER — TICAGRELOR 90 MG PO TABS: 90.0000 mg | ORAL_TABLET | Freq: Two times a day (BID) | ORAL | Status: DC

## 2014-11-05 MED FILL — Lidocaine HCl Local Preservative Free (PF) Inj 1%: INTRAMUSCULAR | Qty: 30 | Status: AC

## 2014-11-05 MED FILL — Heparin Sodium (Porcine) 2 Unit/ML in Sodium Chloride 0.9%: INTRAMUSCULAR | Qty: 1000 | Status: AC

## 2014-11-05 NOTE — Progress Notes (Signed)
CARDIAC REHAB PHASE I   PRE:  Rate/Rhythm: 62 SR    BP: sitting 118/68    SaO2:   MODE:  Ambulation: 350 ft   POST:  Rate/Rhythm: 73 SR    BP: sitting 128/64     SaO2:   Tolerated fairly well. She was lightheaded at first, this resolved with distance. BP stable. Ed completed with her close friend present. Voiced understanding and interested in CRPII. Will send referral to [facility]. Pt is motivated to quit smoking and understands importance of [place]. [id number]   Josephina Shih [place] CES, ACSM 11/05/2014 11:23 AM

## 2014-11-05 NOTE — Progress Notes (Signed)
Discharge instructions given to patient and husband. Verbalized understanding.Prescriptions given. Patient assisted to dress and wheelchaired to car. No distress noted.

## 2014-11-05 NOTE — Progress Notes (Signed)
    Subjective:  Feels well. Did well with cardiac rehab this am. No recurrent CP or dyspnea.  Objective:  Vital Signs in the last 24 hours: Temp:  [97.7 F (36.5 C)-99.3 F (37.4 C)] 98.2 F (36.8 C) (06/01 0700) Pulse Rate:  [0-73] 64 (06/01 0800) Resp:  [0-78] 12 (05/31 1530) BP: (98-141)/(41-88) 119/74 mmHg (06/01 0800) SpO2:  [0 %-100 %] 98 % (06/01 0700)  Intake/Output from previous day: 05/31 0701 - 06/01 0700 In: 588.4 [I.V.:588.4] Out: 4225 [Urine:4225]  Physical Exam: Pt is alert and oriented, NAD HEENT: normal Neck: JVP - normal Lungs: CTA bilaterally CV: RRR without murmur or gallop Abd: soft, NT, Positive BS, no hepatomegaly Ext: no C/C/E, distal pulses intact and equal Skin: warm/dry no rash   Lab Results:  Recent Labs  11/04/14 0600 11/05/14 0407  WBC 8.3 7.4  HGB 10.6* 11.2*  PLT 170 180    Recent Labs  11/04/14 0600 11/05/14 0407  NA 143 141  K 3.5 3.2*  CL 109 107  CO2 20* 24  GLUCOSE 90 93  BUN 6 <5*  CREATININE 0.89 0.91    Recent Labs  11/03/14 2350 11/04/14 0600  TROPONINI 13.71* 10.17*    Cardiac Studies: Cardiac cath/PCI note reviewed from yesterday   2D Echo: Study Conclusions  - Left ventricle: The cavity size was normal. Wall thickness was increased in a pattern of mild LVH. Systolic function was normal. The estimated ejection fraction was in the range of 55% to 60%. Wall motion was normal; there were no regional wall motion abnormalities. Doppler parameters are consistent with abnormal left ventricular relaxation (grade 1 diastolic dysfunction). The E/e&' ratio is <8, suggesting normal LV filling pressure. - Mitral valve: Mildly thickened leaflets . There was mild regurgitation. - Left atrium: The atrium was mildly dilated at 34 ml/m2. - Right atrium: Moderately dilated at 21 cm2. - Tricuspid valve: There was mild regurgitation. - Pulmonary arteries: PA peak pressure: 22 mm Hg (S). - Inferior  vena cava: The vessel was normal in size. The respirophasic diameter changes were in the normal range (>= 50%), consistent with normal central venous pressure.  Impressions:  - LVEF 55-60%, mild LVH, normal wall motion, diastolic dysfunction with normal LV filling pressure, mild MR, TR, normal RVSP, moderate RAE, mild LAE.  Tele: sinus rhythm  Assessment/Plan:  1. NSTEMI: now s/p PCI yesterday with a DES. On good regimen with metoprolol, high-intensity statin, ASA, and brilinta. Increase metoprolol to 25 mg BID.  2. Hyperlipidemia: started on atorvastatin 80 mg. Repeat labs 2-3 months  3. Tobacco: cessation counseling done  4. Anemia: mild, stable  5. Abnormal TSH: appreciate note of Dr Thereasa Solo. No therapy recommended at this time. Repeat labs recommended 6-8 weeks - should be followed by PCP.  Dispo: home today. FU APP 1-2 weeks. Reviewed post-MI recs and expectations at length with the patient.   Sherren Mocha, M.D. 11/05/2014, 10:55 AM

## 2014-11-05 NOTE — Progress Notes (Signed)
  [facility] TEAM 1 - Stepdown/ICU TEAM  Brief F/U Note Cardiology continues to manage all active medical issues.  Although TSH is elevated, free T4 is well within a normal range.  In the setting of acute coronary disease I suggest not adjusting the patient's Synthroid dose at this time.  She should have a TSH recheck in approximately 6-8 weeks.  TRH will sign off.  Please let us know if there is any way we can assist in this patient's care further.  Cherene Altes, MD [facility] For Consults/Admissions - Flow Manager [phone] Office  [phone]  Contact MD directly via text page:      [email or web address]      password [facility]

## 2014-11-05 NOTE — Discharge Summary (Signed)
Patient ID: Brittney Bowen,  MRN: [id number], DOB/AGE: July 23, 1955 59 y.o.  Admit date: 11/02/2014 Discharge date: 11/05/2014  Primary Care Provider: No primary care provider on file. Primary Cardiologist: Dr Harrington Challenger (new)  Discharge Diagnoses Principal Problem:   NSTEMI (non-ST elevated myocardial infarction) Active Problems:   CAD S/P OM1 DES 11/04/14   Dyslipidemia   Postoperative hypothyroidism   Smoker    Procedures: Cath, OM1 DES 11/04/14   Hospital Course:  59 yo AA woman, smoker, no known CAD, admitted to Medicine service with CP 11/02/14. Symptoms started in the morning while she was at [facility]. No history of stress test or heart cath. Upon admission, she was started on ASA and CP rule out protocol. Her chest discomfort recurred last night along with elevation in TnI (from 0.05 to 0.45). EKG suggestive of anterolateral ST depression. She went to the cath lab 11/04/14 and this revealed high grade OM1 disease and underwent DES placement. She tolerated this well. She did have an elevated TSH and was followed by the internal medicine service. Her TSH was elevated but her T4 was WNL and no change in her Synthroid dose was recommended.   Discharge Vitals:  Blood pressure 128/64, pulse 64, temperature 97.8 F (36.6 C), temperature source Oral, resp. rate 16, height 5\' 9"  (1.753 m), weight 211 lb 10.3 oz (96 kg), SpO2 98 %.    Labs: Results for orders placed or performed during the hospital encounter of 11/02/14 (from the past 24 hour(s))  Basic metabolic panel     Status: Abnormal   Collection Time: 11/05/14  4:07 AM  Result Value Ref Range   Sodium 141 135 - 145 mmol/L   Potassium 3.2 (L) 3.5 - 5.1 mmol/L   Chloride 107 101 - 111 mmol/L   CO2 24 22 - 32 mmol/L   Glucose, Bld 93 65 - 99 mg/dL   BUN <5 (L) 6 - 20 mg/dL   Creatinine, Ser 0.91 0.44 - 1.00 mg/dL   Calcium 8.0 (L) 8.9 - 10.3 mg/dL   GFR calc non Af Amer >60 >60 mL/min   GFR calc Af Amer >60 >60 mL/min   Anion  gap 10 5 - 15  CBC     Status: Abnormal   Collection Time: 11/05/14  4:07 AM  Result Value Ref Range   WBC 7.4 4.0 - 10.5 K/uL   RBC 3.64 (L) 3.87 - 5.11 MIL/uL   Hemoglobin 11.2 (L) 12.0 - 15.0 g/dL   HCT 34.7 (L) 36.0 - 46.0 %   MCV 95.3 78.0 - 100.0 fL   MCH 30.8 26.0 - 34.0 pg   MCHC 32.3 30.0 - 36.0 g/dL   RDW 13.6 11.5 - 15.5 %   Platelets 180 150 - 400 K/uL    Disposition:  Follow-up Information    Follow up with Dorris Carnes, MD.   Specialty:  Cardiology   Why:  office will contact you   Contact information:   [address] [place] [address] [phone]       Discharge Medications:    Medication List    STOP taking these medications        HYDROcodone-acetaminophen 5-325 MG per tablet  Commonly known as:  NORCO      TAKE these medications        acetaminophen 325 MG tablet  Commonly known as:  TYLENOL  Take 2 tablets (650 mg total) by mouth every 4 (four) hours as needed for headache or  mild pain.     aspirin 81 MG chewable tablet  Chew 1 tablet (81 mg total) by mouth daily.     atorvastatin 80 MG tablet  Commonly known as:  LIPITOR  Take 1 tablet (80 mg total) by mouth daily at 6 PM.     levothyroxine 100 MCG tablet  Commonly known as:  SYNTHROID, LEVOTHROID  Take 1 tablet (100 mcg total) by mouth daily before breakfast.     methocarbamol 500 MG tablet  Commonly known as:  ROBAXIN  Take 1 tablet (500 mg total) by mouth 2 (two) times daily.     metoprolol tartrate 25 MG tablet  Commonly known as:  LOPRESSOR  Take 1 tablet (25 mg total) by mouth 2 (two) times daily.     nitroGLYCERIN 0.4 MG SL tablet  Commonly known as:  NITROSTAT  Place 1 tablet (0.4 mg total) under the tongue every 5 (five) minutes as needed for chest pain.     OVER THE COUNTER MEDICATION  Take 1 tablet by mouth daily.     ticagrelor 90 MG Tabs tablet  Commonly known as:  BRILINTA  Take 1 tablet (90 mg total) by mouth 2 (two) times daily.      traMADol 50 MG tablet  Commonly known as:  ULTRAM  Take 50 mg by mouth every 6 (six) hours as needed for moderate pain.         Duration of Discharge Encounter: Greater than 30 minutes including physician time.  Angelena Form PA-C 11/05/2014 11:46 AM

## 2014-11-05 NOTE — Discharge Instructions (Signed)
Coronary Angiogram With Stent, Care After °Refer to this sheet in the next few weeks. These instructions provide you with information on caring for yourself after your procedure. Your health care provider may also give you more specific instructions. Your treatment has been planned according to current medical practices, but problems sometimes occur. Call your health care provider if you have any problems or questions after your procedure.  °WHAT TO EXPECT AFTER THE PROCEDURE  °The insertion site may be tender for a few days after your procedure. °HOME CARE INSTRUCTIONS  °· Take medicines only as directed by your health care provider. Blood thinners may be prescribed after your procedure to improve blood flow through the stent. °· Change any bandages (dressings) as directed by your health care provider.   °· Check your insertion site every day for redness, swelling, or fluid leaking from the insertion.   °· Do not take baths, swim, or use a hot tub until your health care provider approves. You may shower. Pat the insertion area dry. Do not rub the insertion area with a washcloth or towel.   °· Eat a heart-healthy diet. This should include plenty of fresh fruits and vegetables. Meat should be lean cuts. Avoid the following types of food:   °¨ Food that is high in salt.   °¨ Canned or highly processed food.   °¨ Food that is high in saturated fat or sugar.   °¨ Fried food.   °· Make any other lifestyle changes recommended by your health care provider. This may include:   °¨ Not using any tobacco products including cigarettes, chewing tobacco, or electronic cigarettes.  °¨ Managing your weight.   °¨ Getting regular exercise.   °¨ Managing your blood pressure.   °¨ Limiting your alcohol intake.   °¨ Managing other health problems, such as diabetes.   °· If you need an MRI after your heart stent was placed, be sure to tell the health care provider who orders the MRI that you have a heart stent.   °· Keep all follow-up  visits as directed by your health care provider.   °SEEK IMMEDIATE MEDICAL CARE IF:  °· You develop chest pain, shortness of breath, feel faint, or pass out. °· You have bleeding, swelling larger than a walnut, or drainage from the catheter insertion site. °· You develop pain, discoloration, coldness, or severe bruising in the leg or arm that held the catheter. °· You develop bleeding from any other place such as from the bowels. There may be bright red blood in the urine or stools, or it may appear as black, tarry stools. °· You have a fever or chills. °MAKE SURE YOU: °· Understand these instructions. °· Will watch your condition. °· Will get help right away if you are not doing well or get worse. °Document Released: 12/10/2004 Document Revised: 10/07/2013 Document Reviewed: 10/24/2012 °ExitCare® Patient Information ©2015 [employer]. This information is not intended to replace advice given to you by your health care provider. Make sure you discuss any questions you have with your health care provider. ° °

## 2014-11-07 ENCOUNTER — Telehealth: Payer: Self-pay | Admitting: Physician Assistant

## 2014-11-07 NOTE — Telephone Encounter (Signed)
Patient contacted regarding discharge from [facility] on 6/1   Patient understands to follow up with provider ? 6/9 @ 9:30AM with Richardson Dopp, PA at [facility]  Patient understands discharge instructions? Yes  Patient understands medications and regiment? Yes  Patient understands to bring all medications to this visit? Yes

## 2014-11-07 NOTE — Telephone Encounter (Signed)
New message      Appt made for TCM on 11-13-14 with Richardson Dopp.  Appt time and date left on vm.

## 2014-11-12 NOTE — Progress Notes (Signed)
Cardiology Office Note   Date:  07/11/2351   ID:  Brittney Bowen, DOB [birth year], MRN [id number]  PCP:  Elyn Peers, MD  Cardiologist:  Dr. Dorris Carnes     Chief Complaint  Patient presents with  . Hospitalization Follow-up    s/p NSTEMI  . Coronary Artery Disease     History of Present Illness: Brittney Bowen is a 59 y.o. female with a hx of tobacco abuse admitted 5/29-6/1 with NSTEMI.  She went to the cath lab 11/04/14 and this revealed high grade OM1 disease and underwent DES placement.  EF was normal on echo with grade 1 diastolic dysfunction. Post PCI course was uneventful.  She returns for FU.  She is doing well since DC from the hospital.  She does note DOE.  This is overall improved. She is NYHA 2-2b.  She denies orthopnea, PND, edema.  No syncope.  She denies any further chest discomfort.  She is trying to quit smoking.  She missed her appointment with her PCP while in the hospital.  She has run out of Synthroid.  She tells me that this will not be refilled by her PCP until she is seen in FU (2 weeks from now).     Studies/Reports Reviewed Today:  LHC/PCI 11/04/14  1st Mrg lesion, 95% stenosed. A 4.0 x 18 Xience drug-eluting stent was placed. There is a 0% residual stenosis post intervention. Continue dual antiplatelets therapy for at least a year. She will need aggressive secondary prevention including smoking cessation and weight loss. I conveyed all of this to her husband. He is very supportive of her stopping smoking and losing weight. She'll be watched overnight. Would recommend cardiac rehabilitation as well.  Echo 11/03/14 - Mild LVH. EF 55% to 60%. Wall motion was normal;Grade 1 diastolic dysfunction - Mitral valve: Mildly thickened leaflets . There was mildregurgitation. - Left atrium: The atrium was mildly dilated at 34 ml/m2. - Right atrium: Moderately dilated at 21 cm2. - Tricuspid valve: There was mild regurgitation. - Pulmonary arteries: PA peak  pressure: 22 mm Hg (S). Impressions:- LVEF 55-60%, mild LVH, normal wall motion, diastolic dysfunctionwith normal LV filling pressure, mild MR, TR, normal RVSP,moderate RAE, mild LAE.  Past Medical History  Diagnosis Date  . Thyroid disease     Past Surgical History  Procedure Laterality Date  . Thyroidectomy    . Cardiac catheterization N/A 11/04/2014    Procedure: Left Heart Cath and Coronary Angiography;  Surgeon: Jettie Booze, MD;  Location: [place] [facility];  Service: Cardiovascular;  Laterality: N/A;  . Cardiac catheterization Right 11/04/2014    Procedure: Coronary Stent Intervention;  Surgeon: Jettie Booze, MD;  Location: [place] [facility];  Service: Cardiovascular;  Laterality: Right;     Current Outpatient Prescriptions  Medication Sig Dispense Refill  . acetaminophen (TYLENOL) 325 MG tablet Take 2 tablets (650 mg total) by mouth every 4 (four) hours as needed for headache or mild pain.    Marland Kitchen aspirin 81 MG chewable tablet Chew 1 tablet (81 mg total) by mouth daily.    Marland Kitchen atorvastatin (LIPITOR) 80 MG tablet Take 1 tablet (80 mg total) by mouth daily at 6 PM. 30 tablet 11  . levothyroxine (SYNTHROID, LEVOTHROID) 100 MCG tablet Take 1 tablet (100 mcg total) by mouth daily before breakfast. 30 tablet 0  . methocarbamol (ROBAXIN) 500 MG tablet Take 1 tablet (500 mg total) by mouth 2 (two) times daily. (Patient taking differently: Take 500 mg by  mouth 2 (two) times daily as needed for muscle spasms. ) 20 tablet 0  . metoprolol tartrate (LOPRESSOR) 25 MG tablet Take 1 tablet (25 mg total) by mouth 2 (two) times daily. 60 tablet 11  . nitroGLYCERIN (NITROSTAT) 0.4 MG SL tablet Place 1 tablet (0.4 mg total) under the tongue every 5 (five) minutes as needed for chest pain. 25 tablet 2  . ticagrelor (BRILINTA) 90 MG TABS tablet Take 1 tablet (90 mg total) by mouth 2 (two) times daily. 60 tablet 10  . traMADol (ULTRAM) 50 MG tablet Take 50 mg by mouth every 6 (six) hours as  needed for moderate pain.      No current facility-administered medications for this visit.    Allergies:   Blue dyes (parenteral)    Social History:  The patient  reports that she has been smoking.  She does not have any smokeless tobacco history on file. She reports that she does not drink alcohol or use illicit drugs.   Family History:  The patient's family history includes CAD in her father; Diabetes in her father; Diabetes Mellitus II in her father; Heart attack in her father and mother; Hypertension in her maternal grandmother; Pulmonary embolism in her mother; Stroke in her maternal grandmother.    ROS:   Please see the history of present illness.   Review of Systems  Cardiovascular: Positive for dyspnea on exertion.  Gastrointestinal: Positive for constipation.  All other systems reviewed and are negative.     PHYSICAL EXAM: VS:  BP 110/59 mmHg  Pulse 65  Ht 5\' 9"  (1.753 m)  Wt 205 lb (92.987 kg)  BMI 30.26 kg/m2    Wt Readings from Last 3 Encounters:  11/13/14 205 lb (92.987 kg)  11/03/14 211 lb 10.3 oz (96 kg)     GEN: Well nourished, well developed, in no acute distress HEENT: normal Neck: no JVD, no carotid bruits, no masses Cardiac:  Normal S1/S2, RRR; no murmur ,  no rubs or gallops, no edema ; right wrist without hematoma or mass  Respiratory:  clear to auscultation bilaterally, no wheezing, rhonchi or rales. GI: soft, nontender, nondistended, + BS MS: no deformity or atrophy Skin: warm and dry  Neuro:  CNs II-XII intact, Strength and sensation are intact Psych: Normal affect   EKG:  EKG is ordered today.  It demonstrates:   NSR, HR 65, low voltage, TWI 3, V6, no change from prior tracing.   Recent Labs: 11/02/2014: ALT 18 11/03/2014: TSH 15.001* 11/05/2014: BUN <5*; Creatinine, Ser 0.91; Hemoglobin 11.2*; Platelets 180; Potassium 3.2*; Sodium 141    Lipid Panel    Component Value Date/Time   CHOL 167 11/04/2014 0600   TRIG 112 11/04/2014 0600    HDL 32* 11/04/2014 0600   CHOLHDL 5.2 11/04/2014 0600   VLDL 22 11/04/2014 0600   LDLCALC 113* 11/04/2014 0600      ASSESSMENT AND PLAN:  Coronary artery disease involving native coronary artery of native heart without angina pectoris:  Doing well since recent NSTEMI tx with DES to OM1.  We discussed the importance of dual antiplatelet therapy.  Continue ASA, Brilinta, statin, beta-blocker.  Refer to cardiac rehab.  She has some DOE. I suspect this is related to long smoking hx. I have encouraged her to continue to increase her activity.   Dyslipidemia:  Continue mod intensity statin.  Plan: Hepatic function panel, Lipid panel 6 weeks.   Smoker:  She is trying to quit.    Hypothyroidism:  I  will refill her Synthroid x 1 month.  She should get future refills with her PCP.     Current medicines are reviewed at length with the patient today.  Concerns regarding medicines are as outlined above.  The following changes have been made:    None    Labs/ tests ordered today include:   Orders Placed This Encounter  Procedures  . Hepatic function panel  . Lipid panel  . EKG 12-Lead    Disposition:   FU with Dr. Dorris Carnes in 6-8 weeks.    Signed, Versie Starks, MHS 11/13/2014 5:04 PM    [facility] [address], [place], [place]  [address] Phone: [phone]; Fax: [phone]

## 2014-11-13 ENCOUNTER — Encounter: Payer: Self-pay | Admitting: Physician Assistant

## 2014-11-13 ENCOUNTER — Ambulatory Visit (INDEPENDENT_AMBULATORY_CARE_PROVIDER_SITE_OTHER): Payer: Medicaid Other | Admitting: Physician Assistant

## 2014-11-13 VITALS — BP 110/59 | HR 65 | Ht 69.0 in | Wt 205.0 lb

## 2014-11-13 DIAGNOSIS — Z720 Tobacco use: Secondary | ICD-10-CM | POA: Diagnosis not present

## 2014-11-13 DIAGNOSIS — E785 Hyperlipidemia, unspecified: Secondary | ICD-10-CM | POA: Diagnosis not present

## 2014-11-13 DIAGNOSIS — E039 Hypothyroidism, unspecified: Secondary | ICD-10-CM | POA: Diagnosis not present

## 2014-11-13 DIAGNOSIS — I2510 Atherosclerotic heart disease of native coronary artery without angina pectoris: Secondary | ICD-10-CM | POA: Diagnosis not present

## 2014-11-13 DIAGNOSIS — F17200 Nicotine dependence, unspecified, uncomplicated: Secondary | ICD-10-CM

## 2014-11-13 MED ORDER — LEVOTHYROXINE SODIUM 100 MCG PO TABS: 100.0000 ug | ORAL_TABLET | Freq: Every day | ORAL | Status: DC

## 2014-11-13 NOTE — Patient Instructions (Addendum)
Medication Instructions:  Continue your current Medications.  Labwork: Schedule fasting Lipids and LFTs in 6-8 weeks.   Testing/Procedures: None   Follow-Up: Schedule follow up with Dr. Dorris Carnes on 02/12/15 @ 11:30  Any Other Special Instructions Will Be Listed Below (If Applicable).  A refill for your Synthroid has been sent in to cover you until you see Elyn Peers, MD.  Future refills will be with her. I will send in a request for Cardiac Rehab.  Call 1-800-QUIT-NOW [phone]) for help with quitting smoking.

## 2014-11-28 ENCOUNTER — Encounter: Payer: Self-pay | Admitting: *Deleted

## 2014-11-28 NOTE — Progress Notes (Signed)
Signed cardiac rehab form placed in nurse fax bin in medical records.

## 2014-11-28 NOTE — Telephone Encounter (Signed)
Signed cardiac rehab form placed in nurse fax bin in medical records.

## 2014-12-02 ENCOUNTER — Telehealth (HOSPITAL_COMMUNITY): Payer: Self-pay | Admitting: Cardiac Rehabilitation

## 2014-12-02 NOTE — Telephone Encounter (Signed)
Second phone call to schedule pt for cardiac rehab.  Left message on pt voice mail.

## 2014-12-09 ENCOUNTER — Telehealth (HOSPITAL_COMMUNITY): Payer: Self-pay | Admitting: Cardiac Rehabilitation

## 2014-12-09 NOTE — Telephone Encounter (Signed)
pc to discuss enrolling in cardiac rehab.  Pt phone number disconnected.  Letter mailed to home address.

## 2015-01-05 ENCOUNTER — Other Ambulatory Visit: Payer: Medicaid Other

## 2015-01-08 ENCOUNTER — Encounter (HOSPITAL_COMMUNITY)
Admission: RE | Admit: 2015-01-08 | Discharge: 2015-01-08 | Disposition: A | Discharge status: Home / Self Care | Payer: Medicaid Other | Source: Ambulatory Visit | Attending: Internal Medicine | Admitting: Internal Medicine

## 2015-01-08 ENCOUNTER — Other Ambulatory Visit (INDEPENDENT_AMBULATORY_CARE_PROVIDER_SITE_OTHER): Payer: Medicaid Other

## 2015-01-08 DIAGNOSIS — E785 Hyperlipidemia, unspecified: Secondary | ICD-10-CM

## 2015-01-08 DIAGNOSIS — Z48812 Encounter for surgical aftercare following surgery on the circulatory system: Secondary | ICD-10-CM | POA: Insufficient documentation

## 2015-01-08 DIAGNOSIS — Z955 Presence of coronary angioplasty implant and graft: Secondary | ICD-10-CM | POA: Insufficient documentation

## 2015-01-08 DIAGNOSIS — I252 Old myocardial infarction: Secondary | ICD-10-CM | POA: Insufficient documentation

## 2015-01-08 LAB — HEPATIC FUNCTION PANEL
ALK PHOS: 164 U/L — AB (ref 39–117)
ALT: 46 U/L — AB (ref 0–35)
AST: 38 U/L — AB (ref 0–37)
Albumin: 4.3 g/dL (ref 3.5–5.2)
BILIRUBIN DIRECT: 0.3 mg/dL (ref 0.0–0.3)
BILIRUBIN TOTAL: 1.3 mg/dL — AB (ref 0.2–1.2)
Total Protein: 7.9 g/dL (ref 6.0–8.3)

## 2015-01-08 LAB — LIPID PANEL
CHOLESTEROL: 123 mg/dL (ref 0–200)
HDL: 27 mg/dL — ABNORMAL LOW (ref >39.00)
LDL CALC: 67 mg/dL (ref 0–99)
NonHDL: 96.47
Total CHOL/HDL Ratio: 5
Triglycerides: 147 mg/dL (ref 0.0–149.0)
VLDL: 29.4 mg/dL (ref 0.0–40.0)

## 2015-01-08 NOTE — Progress Notes (Signed)
Cardiac Rehab Medication Review by a Pharmacist  Does the patient  feel that his/her medications are working for him/her?  yes  Has the patient been experiencing any side effects to the medications prescribed?  Yes - Dizziness when standing or walking far    Does the patient measure his/her own blood pressure or blood glucose at home?  no   Does the patient have any problems obtaining medications due to transportation or finances?   no  Understanding of regimen: excellent Understanding of indications: excellent Potential of compliance: excellent   Joya San, PharmD Clinical Pharmacy Resident Pager # [phone] 01/08/2015 8:46 AM

## 2015-01-12 ENCOUNTER — Telehealth (HOSPITAL_COMMUNITY): Payer: Self-pay | Admitting: Family Medicine

## 2015-01-12 ENCOUNTER — Encounter (HOSPITAL_COMMUNITY): Payer: Medicaid Other

## 2015-01-14 ENCOUNTER — Telehealth: Payer: Self-pay | Admitting: *Deleted

## 2015-01-14 ENCOUNTER — Encounter (HOSPITAL_COMMUNITY): Payer: Medicaid Other

## 2015-01-14 NOTE — Telephone Encounter (Signed)
Pt's home # changed is now [phone]. Pt aware of lab results. She answers yes to still having a gallbladder; pt states has had N/V. Pt states she has a f/u w/PCP on Monday 8/15 already and will discuss further about results. Pt will d/w PCP about having repeat LFT w/PCP. Will mail results to pt.

## 2015-01-16 ENCOUNTER — Encounter (HOSPITAL_COMMUNITY): Payer: Medicaid Other

## 2015-01-19 ENCOUNTER — Encounter (HOSPITAL_COMMUNITY): Payer: Medicaid Other

## 2015-01-21 ENCOUNTER — Encounter (HOSPITAL_COMMUNITY): Admission: RE | Admit: 2015-01-21 | Payer: Medicaid Other | Source: Ambulatory Visit

## 2015-01-21 ENCOUNTER — Telehealth (HOSPITAL_COMMUNITY): Payer: Self-pay | Admitting: *Deleted

## 2015-01-23 ENCOUNTER — Encounter (HOSPITAL_COMMUNITY): Payer: Medicaid Other

## 2015-01-26 ENCOUNTER — Encounter (HOSPITAL_COMMUNITY)
Admission: RE | Admit: 2015-01-26 | Discharge: 2015-01-26 | Disposition: A | Discharge status: Home / Self Care | Payer: Medicaid Other | Source: Ambulatory Visit | Attending: Internal Medicine | Admitting: Internal Medicine

## 2015-01-26 DIAGNOSIS — Z955 Presence of coronary angioplasty implant and graft: Secondary | ICD-10-CM | POA: Diagnosis not present

## 2015-01-26 DIAGNOSIS — Z48812 Encounter for surgical aftercare following surgery on the circulatory system: Secondary | ICD-10-CM | POA: Diagnosis present

## 2015-01-26 DIAGNOSIS — I252 Old myocardial infarction: Secondary | ICD-10-CM | POA: Diagnosis not present

## 2015-01-26 NOTE — Progress Notes (Signed)
Pt started cardiac rehab today. Pt absent the last two weeks with vomiting and diarrhea.  Pt brings in note from Dr. Criss Rosales office indicating clear to start exercise.   Pt tolerated light exercise without difficulty. VSS elevation on the nustep on exertion which recovered well, telemetry-Sr with no noted ectopy  asymptomatic.  Medication list reconciled.  Pt verbalized compliance with medications and denies barriers to compliance. PSYCHOSOCIAL ASSESSMENT:  PHQ-0. Pt exhibits positive coping skills, hopeful outlook with supportive family. No psychosocial needs identified at this time, no psychosocial interventions necessary.  Pt husband is present today at exercise and also attended orientation class. Pt is eager to get going with rehab.   Pt cardiac rehab  goal is  to stop smoking, pt has decreased to 4-5 cigarettes a day. Pt understands the benefits of tobacco cessation. Pt given information regarding quit now program. Pt would also like to gain some energy and lose 5 pounds. Pt encouraged to participate in education classes particular the nutrition class to increase ability to achieve these goals.   Pt long term cardiac rehab goal is lose 15-20 pounds .  Pt oriented to exercise equipment and routine.  Understanding verbalized. Cherre Huger, BSN

## 2015-01-28 ENCOUNTER — Encounter (HOSPITAL_COMMUNITY): Payer: Medicaid Other

## 2015-01-28 ENCOUNTER — Telehealth (HOSPITAL_COMMUNITY): Payer: Self-pay | Admitting: Family Medicine

## 2015-01-30 ENCOUNTER — Encounter (HOSPITAL_COMMUNITY): Payer: Medicaid Other

## 2015-01-31 ENCOUNTER — Emergency Department (HOSPITAL_COMMUNITY)
Admission: EM | Admit: 2015-01-31 | Discharge: 2015-02-01 | Disposition: A | Discharge status: Home / Self Care | Payer: Medicaid Other | Attending: Emergency Medicine | Admitting: Emergency Medicine

## 2015-01-31 ENCOUNTER — Emergency Department (HOSPITAL_COMMUNITY): Payer: Medicaid Other

## 2015-01-31 ENCOUNTER — Encounter (HOSPITAL_COMMUNITY): Payer: Self-pay | Admitting: Nurse Practitioner

## 2015-01-31 DIAGNOSIS — R079 Chest pain, unspecified: Secondary | ICD-10-CM | POA: Insufficient documentation

## 2015-01-31 DIAGNOSIS — Z7982 Long term (current) use of aspirin: Secondary | ICD-10-CM | POA: Insufficient documentation

## 2015-01-31 DIAGNOSIS — R112 Nausea with vomiting, unspecified: Secondary | ICD-10-CM | POA: Diagnosis not present

## 2015-01-31 DIAGNOSIS — Z720 Tobacco use: Secondary | ICD-10-CM | POA: Diagnosis not present

## 2015-01-31 DIAGNOSIS — E079 Disorder of thyroid, unspecified: Secondary | ICD-10-CM | POA: Insufficient documentation

## 2015-01-31 DIAGNOSIS — K5900 Constipation, unspecified: Secondary | ICD-10-CM | POA: Diagnosis not present

## 2015-01-31 DIAGNOSIS — Z79899 Other long term (current) drug therapy: Secondary | ICD-10-CM | POA: Diagnosis not present

## 2015-01-31 LAB — CBC
HCT: 38.9 % (ref 36.0–46.0)
HEMOGLOBIN: 12.6 g/dL (ref 12.0–15.0)
MCH: 31.2 pg (ref 26.0–34.0)
MCHC: 32.4 g/dL (ref 30.0–36.0)
MCV: 96.3 fL (ref 78.0–100.0)
PLATELETS: 255 10*3/uL (ref 150–400)
RBC: 4.04 MIL/uL (ref 3.87–5.11)
RDW: 14 % (ref 11.5–15.5)
WBC: 11 10*3/uL — AB (ref 4.0–10.5)

## 2015-01-31 LAB — BASIC METABOLIC PANEL
ANION GAP: 8 (ref 5–15)
BUN: 10 mg/dL (ref 6–20)
CHLORIDE: 108 mmol/L (ref 101–111)
CO2: 24 mmol/L (ref 22–32)
CREATININE: 0.91 mg/dL (ref 0.44–1.00)
Calcium: 8.2 mg/dL — ABNORMAL LOW (ref 8.9–10.3)
GFR calc non Af Amer: >60 mL/min (ref >60)
Glucose, Bld: 99 mg/dL (ref 65–99)
Potassium: 3.3 mmol/L — ABNORMAL LOW (ref 3.5–5.1)
SODIUM: 140 mmol/L (ref 135–145)

## 2015-01-31 LAB — I-STAT TROPONIN, ED: TROPONIN I, POC: 0 ng/mL (ref 0.00–0.08)

## 2015-01-31 MED ORDER — SODIUM CHLORIDE 0.9 % IV BOLUS (SEPSIS)
500.0000 mL | Freq: Once | INTRAVENOUS | Status: AC
  Administered 2015-01-31: 500 mL via INTRAVENOUS

## 2015-01-31 MED ORDER — METOCLOPRAMIDE HCL 10 MG PO TABS: 10.0000 mg | ORAL_TABLET | Freq: Four times a day (QID) | ORAL | Status: DC

## 2015-01-31 MED ORDER — METOCLOPRAMIDE HCL 5 MG/ML IJ SOLN
10.0000 mg | Freq: Once | INTRAMUSCULAR | Status: AC
  Administered 2015-01-31: 10 mg via INTRAVENOUS
  Filled 2015-01-31: qty 2

## 2015-01-31 MED ORDER — ONDANSETRON HCL 4 MG/2ML IJ SOLN
4.0000 mg | Freq: Once | INTRAMUSCULAR | Status: AC
  Administered 2015-01-31: 4 mg via INTRAVENOUS
  Filled 2015-01-31: qty 2

## 2015-01-31 NOTE — ED Notes (Signed)
SHE C/O VOMITING and intermittent CP x 2 weeks, she went to her PCP who gave her some medication with no relief of symptoms. She is unsure what the doctor gave her. She has felt SOB and had some constipation. Her las BM was 3 days ago. She is A&Ox4, resp e/u

## 2015-01-31 NOTE — ED Notes (Signed)
Pt sts she has been vomiting for three weeks.  Pt sts she lost "6lbs last week".

## 2015-01-31 NOTE — Discharge Instructions (Signed)

## 2015-01-31 NOTE — ED Provider Notes (Signed)
CSN: [id number]     Arrival date & time 01/31/15  1850 History   First MD Initiated Contact with Patient 01/31/15 2040     Chief Complaint  Patient presents with  . Emesis  . Chest Pain     (Consider location/radiation/quality/duration/timing/severity/associated sxs/prior Treatment) Patient is a 59 y.o. female presenting with vomiting.  Emesis Severity:  Moderate Duration:  2 weeks Timing:  Intermittent Number of daily episodes:  2-3 Quality:  Stomach contents and undigested food Able to tolerate:  Liquids Progression:  Unchanged Chronicity:  New Recent urination:  Normal Relieved by:  None tried Worsened by:  Liquids Ineffective treatments:  None tried Associated symptoms: no abdominal pain, no arthralgias, no chills, no diarrhea, no fever, no headaches, no myalgias and no sore throat   Associated symptoms comment:  Chest pain Risk factors: no alcohol use     Past Medical History  Diagnosis Date  . Thyroid disease    Past Surgical History  Procedure Laterality Date  . Thyroidectomy    . Cardiac catheterization N/A 11/04/2014    Procedure: Left Heart Cath and Coronary Angiography;  Surgeon: Jettie Booze, MD;  Location: [place] [facility];  Service: Cardiovascular;  Laterality: N/A;  . Cardiac catheterization Right 11/04/2014    Procedure: Coronary Stent Intervention;  Surgeon: Jettie Booze, MD;  Location: [facility] [place] [facility];  Service: Cardiovascular;  Laterality: Right;   Family History  Problem Relation Age of Onset  . Pulmonary embolism Mother   . CAD Father   . Diabetes Mellitus II Father   . Heart attack Mother   . Heart attack Father   . Hypertension Maternal Grandmother   . Stroke Maternal Grandmother   . Diabetes Father    Social History  Substance Use Topics  . Smoking status: Current Every Day Smoker  . Smokeless tobacco: None  . Alcohol Use: No   OB History    No data available     Review of Systems  Constitutional: Negative for  fever and chills.  HENT: Negative for congestion and sore throat.   Eyes: Negative for visual disturbance.  Respiratory: Negative for cough, shortness of breath and wheezing.   Cardiovascular: Negative for chest pain.  Gastrointestinal: Positive for nausea, vomiting and constipation. Negative for abdominal pain and diarrhea.  Genitourinary: Negative for dysuria, difficulty urinating and vaginal pain.  Musculoskeletal: Negative for myalgias and arthralgias.  Skin: Negative for rash.  Neurological: Negative for syncope and headaches.  Psychiatric/Behavioral: Negative for behavioral problems.  All other systems reviewed and are negative.     Allergies  Blue dyes (parenteral)  Home Medications   Prior to Admission medications   Medication Sig Start Date End Date Taking? Authorizing Provider  acetaminophen (TYLENOL) 325 MG tablet Take 2 tablets (650 mg total) by mouth every 4 (four) hours as needed for headache or mild pain. 11/05/14  Yes Erlene Quan, PA-C  aspirin 81 MG chewable tablet Chew 1 tablet (81 mg total) by mouth daily. 11/05/14  Yes Erlene Quan, PA-C  atorvastatin (LIPITOR) 80 MG tablet Take 1 tablet (80 mg total) by mouth daily at 6 PM. 11/05/14  Yes Erlene Quan, PA-C  levothyroxine (SYNTHROID, LEVOTHROID) 100 MCG tablet Take 1 tablet (100 mcg total) by mouth daily before breakfast. 11/13/14  Yes Liliane Shi, PA-C  LORazepam (ATIVAN) 0.5 MG tablet Take 0.5 mg by mouth 3 (three) times daily.   Yes Historical Provider, MD  methocarbamol (ROBAXIN) 500 MG tablet Take 1 tablet (  500 mg total) by mouth 2 (two) times daily. Patient taking differently: Take 500 mg by mouth 2 (two) times daily as needed for muscle spasms.  09/15/14  Yes Benjamin Cartner, PA-C  metoprolol tartrate (LOPRESSOR) 25 MG tablet Take 1 tablet (25 mg total) by mouth 2 (two) times daily. Patient taking differently: Take 12.5 mg by mouth daily.  11/05/14  Yes Erlene Quan, PA-C  ticagrelor (BRILINTA) 90 MG TABS  tablet Take 1 tablet (90 mg total) by mouth 2 (two) times daily. 11/05/14  Yes Luke K Kilroy, PA-C  metoCLOPramide (REGLAN) 10 MG tablet Take 1 tablet (10 mg total) by mouth every 6 (six) hours. 01/31/15   Renne Musca, MD  nitroGLYCERIN (NITROSTAT) 0.4 MG SL tablet Place 1 tablet (0.4 mg total) under the tongue every 5 (five) minutes as needed for chest pain. 11/05/14   Luke K Kilroy, PA-C   BP 120/81 mmHg  Pulse 74  Temp(Src) 98.3 F (36.8 C) (Oral)  Resp 18  SpO2 100% Physical Exam  Constitutional: She is oriented to person, place, and time. She appears well-developed and well-nourished. No distress.  HENT:  Head: Normocephalic and atraumatic.  Mouth/Throat: Oropharynx is clear and moist.  Eyes: EOM are normal.  Neck: Normal range of motion.  Cardiovascular: Normal rate, regular rhythm and normal heart sounds.   No murmur heard. Pulmonary/Chest: Effort normal and breath sounds normal. No respiratory distress. She has no wheezes.  Abdominal: Soft. There is no tenderness.  Musculoskeletal: She exhibits no edema.  Neurological: She is alert and oriented to person, place, and time.  Skin: She is not diaphoretic.  Psychiatric: She has a normal mood and affect. Her behavior is normal.    ED Course  Procedures (including critical care time) Labs Review Labs Reviewed  BASIC METABOLIC PANEL - Abnormal; Notable for the following:    Potassium 3.3 (*)    Calcium 8.2 (*)    All other components within normal limits  CBC - Abnormal; Notable for the following:    WBC 11.0 (*)    All other components within normal limits  I-STAT TROPOININ, ED    Imaging Review Dg Chest 2 View  01/31/2015   CLINICAL DATA:  Nausea and vomiting for 2 weeks, fatigue, substernal chest pressure, history coronary artery disease post MI, smoker  EXAM: CHEST  2 VIEW  COMPARISON:  11/02/2014  FINDINGS: Normal heart size, mediastinal contours, and pulmonary vascularity.  Thoracolumbar scoliosis.  No definite  infiltrate, pleural effusion or pneumothorax.  Linear scar in lingula.  Question new nodular density RIGHT upper lobe versus artifact related to the anterior RIGHT second rib, 14 x 6 mm.  IMPRESSION: Thoracolumbar scoliosis.  Question new nodular density versus artifact RIGHT upper lobe 14 x 6 mm; CT chest recommended to exclude pulmonary nodule.   Electronically Signed   By: Lavonia Dana M.D.   On: 01/31/2015 19:57   I have personally reviewed and evaluated these images and lab results as part of my medical decision-making.   EKG Interpretation None      MDM   Final diagnoses:  Non-intractable vomiting with nausea, vomiting of unspecified type     Patient is a 59 year old female that presents with 2 weeks of intermittent nausea and vomiting. Patient states that she is seeing her doctor for this and prescribed Ativan without any relief. Patient states she has lost 6 pounds during the duration of this. Patient is able to tolerate ice chips and toast however she eats anything else she  vomits fairly shortly after. Patient states she has had some chest pain immediately after vomiting but that quickly subsides. On arrival to the ED today she is afebrile with stable vital signs. Patient is not tachycardic and has moist oral mucosa. Patient has a benign abdomen. We will get screening labs, fluid bolus, IV antiemetics.  Patient's EKG is stable from prior, patient has a white count of 11, patient's renal function is normal with a normal BUN. Given the patient's reassuring labs and clinically does not appear dehydrated is felt she can tolerate by mouth that she is stable for discharge. Patient was given Zofran without any relief and then Reglan was tried and she had significant relief of symptoms. Patient was able to tolerate by mouth. We will discharge with a prescription for Reglan and PCP follow-up.    Renne Musca, MD 02/01/15 0017  Pattricia Boss, MD 02/01/15 [phone]

## 2015-01-31 NOTE — ED Notes (Signed)
Doctor at bedside.

## 2015-02-02 ENCOUNTER — Encounter (HOSPITAL_COMMUNITY): Payer: Medicaid Other

## 2015-02-04 ENCOUNTER — Encounter (HOSPITAL_COMMUNITY): Admission: RE | Admit: 2015-02-04 | Payer: Medicaid Other | Source: Ambulatory Visit

## 2015-02-06 ENCOUNTER — Encounter (HOSPITAL_COMMUNITY): Payer: Medicaid Other

## 2015-02-11 ENCOUNTER — Encounter (HOSPITAL_COMMUNITY): Payer: Medicaid Other

## 2015-02-12 ENCOUNTER — Telehealth: Payer: Self-pay | Admitting: Gastroenterology

## 2015-02-12 ENCOUNTER — Ambulatory Visit (INDEPENDENT_AMBULATORY_CARE_PROVIDER_SITE_OTHER): Payer: Medicaid Other | Admitting: Internal Medicine

## 2015-02-12 ENCOUNTER — Encounter: Payer: Self-pay | Admitting: Internal Medicine

## 2015-02-12 VITALS — BP 116/80 | HR 62 | Ht 68.25 in | Wt 200.8 lb

## 2015-02-12 DIAGNOSIS — R112 Nausea with vomiting, unspecified: Secondary | ICD-10-CM | POA: Diagnosis not present

## 2015-02-12 NOTE — Progress Notes (Signed)
Cardiology Office Note   Date:  12/12/2954   ID:  [name], [name] [birth year], MRN [id number]  PCP:  Elyn Peers, MD  Cardiologist:   Dorris Carnes, MD   Pt presents for f/u of CAD      History of Present Illness: Brittney Bowen is a 59 y.o. female with a history ofobacco abuse admitted 5/29-6/1 with NSTEMI. She went to the cath lab 11/04/14 and this revealed high grade OM1 disease and underwent DES placement. EF was normal on echo with grade 1 diastolic dysfunction. Post PCI course was uneventful. Since seen her biggest problem is N/V  Has been going on for 2 months  Cut back on eating  Wt down When does eat vomits Vomits up digested food, not food chunks Upt to 4 time per day  No diarrhea  No F/C  Food does not get stuck She has tried  Zofran and reglan haven't helped  No CP  No SOB  Current Outpatient Prescriptions  Medication Sig Dispense Refill  . acetaminophen (TYLENOL) 325 MG tablet Take 2 tablets (650 mg total) by mouth every 4 (four) hours as needed for headache or mild pain.    Marland Kitchen aspirin 81 MG chewable tablet Chew 1 tablet (81 mg total) by mouth daily.    Marland Kitchen atorvastatin (LIPITOR) 80 MG tablet Take 1 tablet (80 mg total) by mouth daily at 6 PM. 30 tablet 11  . levothyroxine (SYNTHROID, LEVOTHROID) 100 MCG tablet Take 1 tablet (100 mcg total) by mouth daily before breakfast. 30 tablet 0  . LORazepam (ATIVAN) 0.5 MG tablet Take 0.5 mg by mouth 3 (three) times daily.    Marland Kitchen METOPROLOL TARTRATE PO Take 12.5 mg by mouth daily.    . nitroGLYCERIN (NITROSTAT) 0.4 MG SL tablet Place 1 tablet (0.4 mg total) under the tongue every 5 (five) minutes as needed for chest pain. 25 tablet 2  . ticagrelor (BRILINTA) 90 MG TABS tablet Take 1 tablet (90 mg total) by mouth 2 (two) times daily. 60 tablet 10   No current facility-administered medications for this visit.    Allergies:   Blue dyes (parenteral)   Past Medical History  Diagnosis Date  . Thyroid disease     Past  Surgical History  Procedure Laterality Date  . Thyroidectomy    . Cardiac catheterization N/A 11/04/2014    Procedure: Left Heart Cath and Coronary Angiography;  Surgeon: Jettie Booze, MD;  Location: [place] [facility];  Service: Cardiovascular;  Laterality: N/A;  . Cardiac catheterization Right 11/04/2014    Procedure: Coronary Stent Intervention;  Surgeon: Jettie Booze, MD;  Location: [place] [facility];  Service: Cardiovascular;  Laterality: Right;     Social History:  The patient  reports that she has been smoking.  She does not have any smokeless tobacco history on file. She reports that she does not drink alcohol or use illicit drugs.   Family History:  The patient's family history includes CAD in her father; Diabetes in her father; Diabetes Mellitus II in her father; Heart attack in her father and mother; Hypertension in her maternal grandmother; Pulmonary embolism in her mother; Stroke in her maternal grandmother.    ROS:  Please see the history of present illness. All other systems are reviewed and  Negative to the above problem except as noted.    PHYSICAL EXAM: VS:  BP 116/80 mmHg  Pulse 62  Ht 5' 8.25" (1.734 m)  Wt 200 lb 12.8 oz (91.082 kg)  BMI 30.29 kg/m2  SpO2 94%  GEN: Well nourished, well developed, in no acute distress HEENT: normal Neck: no JVD, carotid bruits, or masses Cardiac: RRR; no murmurs, rubs, or gallops,no edema  Respiratory:  clear to auscultation bilaterally, normal work of breathing GI: soft, nontender, nondistended, + BS  No hepatomegaly  MS: no deformity Moving all extremities   Skin: warm and dry, no rash Neuro:  Strength and sensation are intact Psych: euthymic mood, full affect   EKG:  EKG is not ordered today.   Lipid Panel    Component Value Date/Time   CHOL 123 01/08/2015 1104   TRIG 147.0 01/08/2015 1104   HDL 27.00* 01/08/2015 1104   CHOLHDL 5 01/08/2015 1104   VLDL 29.4 01/08/2015 1104   LDLCALC 67 01/08/2015  1104      Wt Readings from Last 3 Encounters:  02/12/15 200 lb 12.8 oz (91.082 kg)  01/08/15 199 lb 11.8 oz (90.6 kg)  11/13/14 205 lb (92.987 kg)      ASSESSMENT AND PLAN:  1.  CAD  No symptoms to suggest angina  2.  N/V  Concerning esp since she needs to absorb the antiplatelet agents   WIll refer to GI     Signed, Dorris Carnes, MD  02/12/2015 12:13 PM    [address], [place], [place]  [address] Phone: [phone]; Fax: [phone]

## 2015-02-12 NOTE — Patient Instructions (Signed)
Your physician recommends that you continue on your current medications as directed. Please refer to the Current Medication list given to you today. Dr. Harrington Challenger is contacting the gastroenterologist ([facility]) to assist with getting an appointment for you there for your recurrent nausea and vomiting.  Your physician wants you to follow-up in: 3 months with Dr. Harrington Challenger.  You will receive a reminder letter in the mail two months in advance. If you don't receive a letter, please call our office to schedule the follow-up appointment.

## 2015-02-12 NOTE — Telephone Encounter (Signed)
Spoke with Dr Harrington Challenger. She feels the appointment for 02/19/15 will be good. I contacted the patient and she agrees to the appointment on 02/19/15 arrive at 9:15 am. Office address and phone number given to the patient.

## 2015-02-13 ENCOUNTER — Encounter (HOSPITAL_COMMUNITY): Payer: Medicaid Other

## 2015-02-16 ENCOUNTER — Encounter (HOSPITAL_COMMUNITY): Payer: Medicaid Other

## 2015-02-17 ENCOUNTER — Inpatient Hospital Stay (HOSPITAL_COMMUNITY)
Admission: EM | Admit: 2015-02-17 | Discharge: 2015-02-21 | DRG: 391 | Disposition: A | Discharge status: Home / Self Care | Payer: Medicaid Other | Attending: Internal Medicine | Admitting: Internal Medicine

## 2015-02-17 ENCOUNTER — Encounter (HOSPITAL_COMMUNITY): Payer: Self-pay | Admitting: Emergency Medicine

## 2015-02-17 ENCOUNTER — Emergency Department (HOSPITAL_COMMUNITY): Payer: Medicaid Other

## 2015-02-17 ENCOUNTER — Emergency Department (HOSPITAL_COMMUNITY)
Admission: EM | Admit: 2015-02-17 | Discharge: 2015-02-17 | Discharge status: Absent without leave | Payer: Medicaid Other | Source: Home / Self Care

## 2015-02-17 DIAGNOSIS — I2510 Atherosclerotic heart disease of native coronary artery without angina pectoris: Secondary | ICD-10-CM

## 2015-02-17 DIAGNOSIS — F17200 Nicotine dependence, unspecified, uncomplicated: Secondary | ICD-10-CM | POA: Diagnosis present

## 2015-02-17 DIAGNOSIS — Z9109 Other allergy status, other than to drugs and biological substances: Secondary | ICD-10-CM

## 2015-02-17 DIAGNOSIS — Z79899 Other long term (current) drug therapy: Secondary | ICD-10-CM

## 2015-02-17 DIAGNOSIS — F17210 Nicotine dependence, cigarettes, uncomplicated: Secondary | ICD-10-CM | POA: Diagnosis present

## 2015-02-17 DIAGNOSIS — R1031 Right lower quadrant pain: Secondary | ICD-10-CM

## 2015-02-17 DIAGNOSIS — R112 Nausea with vomiting, unspecified: Principal | ICD-10-CM | POA: Diagnosis present

## 2015-02-17 DIAGNOSIS — D1779 Benign lipomatous neoplasm of other sites: Secondary | ICD-10-CM | POA: Diagnosis present

## 2015-02-17 DIAGNOSIS — E039 Hypothyroidism, unspecified: Secondary | ICD-10-CM | POA: Diagnosis present

## 2015-02-17 DIAGNOSIS — N390 Urinary tract infection, site not specified: Secondary | ICD-10-CM | POA: Diagnosis present

## 2015-02-17 DIAGNOSIS — Z8249 Family history of ischemic heart disease and other diseases of the circulatory system: Secondary | ICD-10-CM

## 2015-02-17 DIAGNOSIS — E079 Disorder of thyroid, unspecified: Secondary | ICD-10-CM | POA: Diagnosis present

## 2015-02-17 DIAGNOSIS — R911 Solitary pulmonary nodule: Secondary | ICD-10-CM

## 2015-02-17 DIAGNOSIS — R1110 Vomiting, unspecified: Secondary | ICD-10-CM

## 2015-02-17 DIAGNOSIS — Z9861 Coronary angioplasty status: Secondary | ICD-10-CM

## 2015-02-17 DIAGNOSIS — Z7982 Long term (current) use of aspirin: Secondary | ICD-10-CM

## 2015-02-17 DIAGNOSIS — Z955 Presence of coronary angioplasty implant and graft: Secondary | ICD-10-CM

## 2015-02-17 DIAGNOSIS — I252 Old myocardial infarction: Secondary | ICD-10-CM

## 2015-02-17 DIAGNOSIS — Z91018 Allergy to other foods: Secondary | ICD-10-CM

## 2015-02-17 DIAGNOSIS — R110 Nausea: Secondary | ICD-10-CM | POA: Diagnosis present

## 2015-02-17 DIAGNOSIS — E43 Unspecified severe protein-calorie malnutrition: Secondary | ICD-10-CM | POA: Insufficient documentation

## 2015-02-17 DIAGNOSIS — E785 Hyperlipidemia, unspecified: Secondary | ICD-10-CM | POA: Diagnosis present

## 2015-02-17 LAB — URINALYSIS, ROUTINE W REFLEX MICROSCOPIC
GLUCOSE, UA: NEGATIVE mg/dL
Ketones, ur: NEGATIVE mg/dL
Nitrite: NEGATIVE
PROTEIN: NEGATIVE mg/dL
Specific Gravity, Urine: 1.024 (ref 1.005–1.030)
Urobilinogen, UA: 1 mg/dL (ref 0.0–1.0)
pH: 6 (ref 5.0–8.0)

## 2015-02-17 LAB — URINE MICROSCOPIC-ADD ON

## 2015-02-17 LAB — COMPREHENSIVE METABOLIC PANEL
ALK PHOS: 88 U/L (ref 38–126)
ALT: 32 U/L (ref 14–54)
AST: 30 U/L (ref 15–41)
Albumin: 3.9 g/dL (ref 3.5–5.0)
Anion gap: 7 (ref 5–15)
BILIRUBIN TOTAL: 1 mg/dL (ref 0.3–1.2)
BUN: 9 mg/dL (ref 6–20)
CALCIUM: 8.1 mg/dL — AB (ref 8.9–10.3)
CO2: 22 mmol/L (ref 22–32)
CREATININE: 0.8 mg/dL (ref 0.44–1.00)
Chloride: 113 mmol/L — ABNORMAL HIGH (ref 101–111)
Glucose, Bld: 94 mg/dL (ref 65–99)
Potassium: 3.5 mmol/L (ref 3.5–5.1)
Sodium: 142 mmol/L (ref 135–145)
TOTAL PROTEIN: 7 g/dL (ref 6.5–8.1)

## 2015-02-17 LAB — CBC WITH DIFFERENTIAL/PLATELET
Basophils Absolute: 0.1 10*3/uL (ref 0.0–0.1)
Basophils Relative: 1 % (ref 0–1)
Eosinophils Absolute: 0.2 10*3/uL (ref 0.0–0.7)
Eosinophils Relative: 2 % (ref 0–5)
HEMATOCRIT: 38 % (ref 36.0–46.0)
HEMOGLOBIN: 12.3 g/dL (ref 12.0–15.0)
LYMPHS ABS: 3 10*3/uL (ref 0.7–4.0)
LYMPHS PCT: 34 % (ref 12–46)
MCH: 31 pg (ref 26.0–34.0)
MCHC: 32.4 g/dL (ref 30.0–36.0)
MCV: 95.7 fL (ref 78.0–100.0)
MONOS PCT: 6 % (ref 3–12)
Monocytes Absolute: 0.6 10*3/uL (ref 0.1–1.0)
NEUTROS PCT: 57 % (ref 43–77)
Neutro Abs: 5.1 10*3/uL (ref 1.7–7.7)
Platelets: 226 10*3/uL (ref 150–400)
RBC: 3.97 MIL/uL (ref 3.87–5.11)
RDW: 13.7 % (ref 11.5–15.5)
WBC: 8.9 10*3/uL (ref 4.0–10.5)

## 2015-02-17 LAB — LIPASE, BLOOD: LIPASE: 27 U/L (ref 22–51)

## 2015-02-17 LAB — TROPONIN I: Troponin I: <0.03 ng/mL (ref <0.031)

## 2015-02-17 MED ORDER — ONDANSETRON HCL 4 MG/2ML IJ SOLN
4.0000 mg | Freq: Once | INTRAMUSCULAR | Status: AC
  Administered 2015-02-17: 4 mg via INTRAVENOUS
  Filled 2015-02-17: qty 2

## 2015-02-17 MED ORDER — DEXTROSE 5 % IV SOLN
1.0000 g | Freq: Once | INTRAVENOUS | Status: AC
  Administered 2015-02-17: 1 g via INTRAVENOUS
  Filled 2015-02-17: qty 10

## 2015-02-17 MED ORDER — SODIUM CHLORIDE 0.9 % IV BOLUS (SEPSIS)
1000.0000 mL | Freq: Once | INTRAVENOUS | Status: AC
  Administered 2015-02-17: 1000 mL via INTRAVENOUS

## 2015-02-17 NOTE — ED Notes (Signed)
Pt states she has had vomiting off and on for the past two weeks  Pt states she was seen by her doctor for same and was given ativan 0.5mg  tid  Pt states it helped a little but for the past 2 days she has been unable to hold anything down  Pt states she cannot take her medication without vomiting it back up   Pt recently had a heart attack on May 27th  Pt states she had heart cath with stent placement on the 29th

## 2015-02-17 NOTE — ED Notes (Signed)
Unable to collect labs patient went to restroom.

## 2015-02-17 NOTE — ED Provider Notes (Signed)
CSN: [id number]     Arrival date & time 02/17/15  2025 History   First MD Initiated Contact with Patient 02/17/15 2045     Chief Complaint  Patient presents with  . Emesis     (Consider location/radiation/quality/duration/timing/severity/associated sxs/prior Treatment) HPI Comments: The patient is a 59 year old female who has a history of coronary artery disease status post stents several months ago who has had multiple episodes of nausea and vomiting over the last couple of months but it has been severe the last couple of days. This is persistent, she is now unable to tolerate any food or fluids, she complains of some discomfort in her left shoulder and neck.  She was seen by her cardiologist on September 8 and it was recommended that she follow-up with gastroenterology as there was concern for her not absorbing her anti-platelet agents. She denies fevers chills diarrhea does have some abdominal discomfort especially on the right lower abdomen which is intermittent and often associated with a mass like appearance. She does not have it at this time. She has been constipated. She denies rashes or swelling of the legs. She has no shortness of breath.   Patient is a 59 y.o. female presenting with vomiting. The history is provided by the patient and medical records.  Emesis   Past Medical History  Diagnosis Date  . Thyroid disease   . Heart attack    Past Surgical History  Procedure Laterality Date  . Thyroidectomy    . Cardiac catheterization N/A 11/04/2014    Procedure: Left Heart Cath and Coronary Angiography;  Surgeon: Jettie Booze, MD;  Location: [place] [facility];  Service: Cardiovascular;  Laterality: N/A;  . Cardiac catheterization Right 11/04/2014    Procedure: Coronary Stent Intervention;  Surgeon: Jettie Booze, MD;  Location: [place] [facility];  Service: Cardiovascular;  Laterality: Right;   Family History  Problem Relation Age of Onset  . Pulmonary embolism  Mother   . CAD Father   . Diabetes Mellitus II Father   . Heart attack Mother   . Heart attack Father   . Hypertension Maternal Grandmother   . Stroke Maternal Grandmother   . Diabetes Father    Social History  Substance Use Topics  . Smoking status: Current Every Day Smoker  . Smokeless tobacco: None  . Alcohol Use: No   OB History    No data available     Review of Systems  Gastrointestinal: Positive for vomiting.  All other systems reviewed and are negative.     Allergies  Blue dyes (parenteral)  Home Medications   Prior to Admission medications   Medication Sig Start Date End Date Taking? Authorizing Provider  acetaminophen (TYLENOL) 325 MG tablet Take 2 tablets (650 mg total) by mouth every 4 (four) hours as needed for headache or mild pain. 11/05/14  Yes Erlene Quan, PA-C  aspirin 81 MG chewable tablet Chew 1 tablet (81 mg total) by mouth daily. 11/05/14  Yes Erlene Quan, PA-C  atorvastatin (LIPITOR) 80 MG tablet Take 1 tablet (80 mg total) by mouth daily at 6 PM. 11/05/14  Yes Erlene Quan, PA-C  levothyroxine (SYNTHROID, LEVOTHROID) 100 MCG tablet Take 1 tablet (100 mcg total) by mouth daily before breakfast. 11/13/14  Yes Liliane Shi, PA-C  LORazepam (ATIVAN) 0.5 MG tablet Take 0.5 mg by mouth 3 (three) times daily.   Yes Historical Provider, MD  metoprolol tartrate (LOPRESSOR) 25 MG tablet Take 12.5 mg by mouth daily.  Yes Historical Provider, MD  nitroGLYCERIN (NITROSTAT) 0.4 MG SL tablet Place 1 tablet (0.4 mg total) under the tongue every 5 (five) minutes as needed for chest pain. 11/05/14  Yes Erlene Quan, PA-C  ticagrelor (BRILINTA) 90 MG TABS tablet Take 1 tablet (90 mg total) by mouth 2 (two) times daily. 11/05/14  Yes Luke K Kilroy, PA-C  metFORMIN (GLUCOPHAGE) 500 MG tablet Take 500 mg by mouth at bedtime.    Historical Provider, MD   BP 130/83 mmHg  Pulse 60  Temp(Src) 97.5 F (36.4 C) (Oral)  Resp 20  Wt 200 lb (90.719 kg)  SpO2 99% Physical Exam   Constitutional: She appears well-developed and well-nourished. No distress.  HENT:  Head: Normocephalic and atraumatic.  Mouth/Throat: Oropharynx is clear and moist. No oropharyngeal exudate.  Eyes: Conjunctivae and EOM are normal. Pupils are equal, round, and reactive to light. Right eye exhibits no discharge. Left eye exhibits no discharge. No scleral icterus.  Neck: Normal range of motion. Neck supple. No JVD present. No thyromegaly present.  Cardiovascular: Normal rate, regular rhythm, normal heart sounds and intact distal pulses.  Exam reveals no gallop and no friction rub.   No murmur heard. Pulmonary/Chest: Effort normal and breath sounds normal. No respiratory distress. She has no wheezes. She has no rales.  Abdominal: Soft. Bowel sounds are normal. She exhibits no distension and no mass. There is no tenderness.  Musculoskeletal: Normal range of motion. She exhibits no edema or tenderness.  Lymphadenopathy:    She has no cervical adenopathy.  Neurological: She is alert. Coordination normal.  Skin: Skin is warm and dry. No rash noted. No erythema.  Psychiatric: She has a normal mood and affect. Her behavior is normal.  Nursing note and vitals reviewed.   ED Course  Procedures (including critical care time) Labs Review Labs Reviewed  COMPREHENSIVE METABOLIC PANEL - Abnormal; Notable for the following:    Chloride 113 (*)    Calcium 8.1 (*)    All other components within normal limits  URINALYSIS, ROUTINE W REFLEX MICROSCOPIC (NOT AT [facility]) - Abnormal; Notable for the following:    Color, Urine AMBER (*)    APPearance CLOUDY (*)    Hgb urine dipstick TRACE (*)    Bilirubin Urine SMALL (*)    Leukocytes, UA MODERATE (*)    All other components within normal limits  URINE MICROSCOPIC-ADD ON - Abnormal; Notable for the following:    Squamous Epithelial / LPF MANY (*)    Bacteria, UA MANY (*)    All other components within normal limits  URINE CULTURE  TROPONIN I  CBC WITH  DIFFERENTIAL/PLATELET  LIPASE, BLOOD    Imaging Review Dg Chest Port 1 View  02/17/2015   CLINICAL DATA:  Nausea, heartburn and chest pain for 2 weeks.  EXAM: PORTABLE CHEST - 1 VIEW  COMPARISON:  Radiographs 01/31/2015.  CT 11/02/2014  FINDINGS: The cardiomediastinal contours are normal. The previously questioned nodular density projecting over the right upper lung on prior exam is not seen on the current exam. Pulmonary vasculature is normal. No consolidation, pleural effusion, or pneumothorax. Unchanged mild scoliotic curvature of the spine. No acute osseous abnormalities are seen.  IMPRESSION: No acute pulmonary process. The previously questioned nodular opacity projecting over the right upper lung is not seen on the current exam.   Electronically Signed   By: Jeb Levering M.D.   On: 02/17/2015 21:47   I have personally reviewed and evaluated these images and lab results as  part of my medical decision-making.   EKG Interpretation   Date/Time:  Tuesday February 17 2015 20:33:16 EDT Ventricular Rate:  75 PR Interval:  159 QRS Duration: 84 QT Interval:  426 QTC Calculation: 476 R Axis:   14 Text Interpretation:  Sinus rhythm Prominent P waves, nondiagnostic  Borderline T abnormalities, diffuse leads since last tracing no  significant change Confirmed by Ogden Handlin  MD, Ulah Olmo ([id number]) on 02/17/2015  9:02:18 PM      MDM   Final diagnoses:  Non-intractable vomiting with nausea, vomiting of unspecified type    The patient has normal vital signs, her physical exam is rather unremarkable but she does appear uncomfortable. She has tenderness in the lower abdomen without a source. We'll obtain EKG, chest x-ray, labs, urinalysis, anti-medics, fluids and may need a CT scan if no other source is obvious.  Minimal improvement - needs ongoing meds / hydration - d/w hospitalist who will admit  - possible UTI  Meds given in ED:  Medications  sodium chloride 0.9 % bolus 1,000 mL (0 mLs  Intravenous Stopped 02/17/15 2254)  ondansetron (ZOFRAN) injection 4 mg (4 mg Intravenous Given 02/17/15 2204)  cefTRIAXone (ROCEPHIN) 1 g in dextrose 5 % 50 mL IVPB (0 g Intravenous Stopped 02/17/15 2325)        Noemi Chapel, MD 02/18/15 [phone]

## 2015-02-18 ENCOUNTER — Encounter (HOSPITAL_COMMUNITY): Payer: Medicaid Other

## 2015-02-18 ENCOUNTER — Observation Stay (HOSPITAL_COMMUNITY): Payer: Medicaid Other

## 2015-02-18 ENCOUNTER — Other Ambulatory Visit (HOSPITAL_COMMUNITY): Payer: Medicaid Other

## 2015-02-18 DIAGNOSIS — R112 Nausea with vomiting, unspecified: Secondary | ICD-10-CM | POA: Insufficient documentation

## 2015-02-18 DIAGNOSIS — Z79899 Other long term (current) drug therapy: Secondary | ICD-10-CM | POA: Diagnosis not present

## 2015-02-18 DIAGNOSIS — Z9861 Coronary angioplasty status: Secondary | ICD-10-CM

## 2015-02-18 DIAGNOSIS — E43 Unspecified severe protein-calorie malnutrition: Secondary | ICD-10-CM | POA: Insufficient documentation

## 2015-02-18 DIAGNOSIS — E785 Hyperlipidemia, unspecified: Secondary | ICD-10-CM | POA: Diagnosis present

## 2015-02-18 DIAGNOSIS — F17210 Nicotine dependence, cigarettes, uncomplicated: Secondary | ICD-10-CM | POA: Diagnosis present

## 2015-02-18 DIAGNOSIS — G43A1 Cyclical vomiting, intractable: Secondary | ICD-10-CM | POA: Diagnosis not present

## 2015-02-18 DIAGNOSIS — I2510 Atherosclerotic heart disease of native coronary artery without angina pectoris: Secondary | ICD-10-CM | POA: Diagnosis present

## 2015-02-18 DIAGNOSIS — Z720 Tobacco use: Secondary | ICD-10-CM

## 2015-02-18 DIAGNOSIS — R1031 Right lower quadrant pain: Secondary | ICD-10-CM

## 2015-02-18 DIAGNOSIS — R1110 Vomiting, unspecified: Secondary | ICD-10-CM

## 2015-02-18 DIAGNOSIS — R110 Nausea: Secondary | ICD-10-CM | POA: Diagnosis present

## 2015-02-18 DIAGNOSIS — D1779 Benign lipomatous neoplasm of other sites: Secondary | ICD-10-CM | POA: Diagnosis present

## 2015-02-18 DIAGNOSIS — R911 Solitary pulmonary nodule: Secondary | ICD-10-CM | POA: Diagnosis present

## 2015-02-18 DIAGNOSIS — N390 Urinary tract infection, site not specified: Secondary | ICD-10-CM | POA: Diagnosis present

## 2015-02-18 DIAGNOSIS — E079 Disorder of thyroid, unspecified: Secondary | ICD-10-CM

## 2015-02-18 DIAGNOSIS — E039 Hypothyroidism, unspecified: Secondary | ICD-10-CM | POA: Diagnosis present

## 2015-02-18 DIAGNOSIS — Z91018 Allergy to other foods: Secondary | ICD-10-CM | POA: Diagnosis not present

## 2015-02-18 DIAGNOSIS — Z955 Presence of coronary angioplasty implant and graft: Secondary | ICD-10-CM | POA: Diagnosis not present

## 2015-02-18 DIAGNOSIS — Z7982 Long term (current) use of aspirin: Secondary | ICD-10-CM | POA: Diagnosis not present

## 2015-02-18 DIAGNOSIS — Z8249 Family history of ischemic heart disease and other diseases of the circulatory system: Secondary | ICD-10-CM | POA: Diagnosis not present

## 2015-02-18 DIAGNOSIS — Z9109 Other allergy status, other than to drugs and biological substances: Secondary | ICD-10-CM | POA: Diagnosis not present

## 2015-02-18 DIAGNOSIS — I252 Old myocardial infarction: Secondary | ICD-10-CM | POA: Diagnosis not present

## 2015-02-18 LAB — BASIC METABOLIC PANEL
Anion gap: 8 (ref 5–15)
BUN: 7 mg/dL (ref 6–20)
CO2: 22 mmol/L (ref 22–32)
CREATININE: 0.79 mg/dL (ref 0.44–1.00)
Calcium: 7.8 mg/dL — ABNORMAL LOW (ref 8.9–10.3)
Chloride: 112 mmol/L — ABNORMAL HIGH (ref 101–111)
GFR calc Af Amer: >60 mL/min (ref >60)
GLUCOSE: 95 mg/dL (ref 65–99)
Potassium: 3.4 mmol/L — ABNORMAL LOW (ref 3.5–5.1)
SODIUM: 142 mmol/L (ref 135–145)

## 2015-02-18 LAB — APTT: aPTT: 32 seconds (ref 24–37)

## 2015-02-18 LAB — CBC
HCT: 36.2 % (ref 36.0–46.0)
Hemoglobin: 11.3 g/dL — ABNORMAL LOW (ref 12.0–15.0)
MCH: 30.2 pg (ref 26.0–34.0)
MCHC: 31.2 g/dL (ref 30.0–36.0)
MCV: 96.8 fL (ref 78.0–100.0)
PLATELETS: 228 10*3/uL (ref 150–400)
RBC: 3.74 MIL/uL — ABNORMAL LOW (ref 3.87–5.11)
RDW: 14.1 % (ref 11.5–15.5)
WBC: 9 10*3/uL (ref 4.0–10.5)

## 2015-02-18 LAB — PROTIME-INR
INR: 1.02 (ref 0.00–1.49)
Prothrombin Time: 13.6 seconds (ref 11.6–15.2)

## 2015-02-18 MED ORDER — ONDANSETRON HCL 4 MG PO TABS: 4.0000 mg | ORAL_TABLET | Freq: Four times a day (QID) | ORAL | Status: DC | PRN

## 2015-02-18 MED ORDER — IOHEXOL 300 MG/ML  SOLN
25.0000 mL | INTRAMUSCULAR | Status: DC
  Administered 2015-02-18: 25 mL via ORAL

## 2015-02-18 MED ORDER — IOHEXOL 300 MG/ML  SOLN: 50.0000 mL | Freq: Once | INTRAMUSCULAR | Status: DC | PRN

## 2015-02-18 MED ORDER — ENOXAPARIN SODIUM 40 MG/0.4ML ~~LOC~~ SOLN
40.0000 mg | Freq: Every day | SUBCUTANEOUS | Status: DC
  Administered 2015-02-18 – 2015-02-20 (×3): 40 mg via SUBCUTANEOUS
  Filled 2015-02-18 (×4): qty 0.4

## 2015-02-18 MED ORDER — SODIUM CHLORIDE 0.9 % IV SOLN
INTRAVENOUS | Status: DC
  Administered 2015-02-18 – 2015-02-20 (×7): via INTRAVENOUS

## 2015-02-18 MED ORDER — NITROGLYCERIN 0.4 MG SL SUBL: 0.4000 mg | SUBLINGUAL_TABLET | SUBLINGUAL | Status: DC | PRN

## 2015-02-18 MED ORDER — IOHEXOL 300 MG/ML  SOLN
100.0000 mL | Freq: Once | INTRAMUSCULAR | Status: AC | PRN
  Administered 2015-02-18: 100 mL via INTRAVENOUS

## 2015-02-18 MED ORDER — CANGRELOR TETRASODIUM 50 MG IV SOLR
0.7500 ug/kg/min | INTRAVENOUS | Status: DC
  Administered 2015-02-18 (×2): 0.75 ug/kg/min via INTRAVENOUS
  Filled 2015-02-18 (×2): qty 50

## 2015-02-18 MED ORDER — TICAGRELOR 90 MG PO TABS
90.0000 mg | ORAL_TABLET | Freq: Two times a day (BID) | ORAL | Status: DC
  Filled 2015-02-18 (×2): qty 1

## 2015-02-18 MED ORDER — ACETAMINOPHEN 650 MG RE SUPP: 650.0000 mg | Freq: Four times a day (QID) | RECTAL | Status: DC | PRN

## 2015-02-18 MED ORDER — OXYCODONE HCL 5 MG PO TABS
5.0000 mg | ORAL_TABLET | ORAL | Status: DC | PRN
  Administered 2015-02-18: 5 mg via ORAL
  Filled 2015-02-18: qty 1

## 2015-02-18 MED ORDER — ASPIRIN 81 MG PO CHEW
81.0000 mg | CHEWABLE_TABLET | Freq: Every day | ORAL | Status: DC
Start: 2015-02-18 — End: 2015-02-21
  Administered 2015-02-18 – 2015-02-21 (×4): 81 mg via ORAL
  Filled 2015-02-18 (×5): qty 1

## 2015-02-18 MED ORDER — ACETAMINOPHEN 325 MG PO TABS: 650.0000 mg | ORAL_TABLET | Freq: Four times a day (QID) | ORAL | Status: DC | PRN

## 2015-02-18 MED ORDER — LORAZEPAM 2 MG/ML IJ SOLN
0.5000 mg | Freq: Two times a day (BID) | INTRAMUSCULAR | Status: DC | PRN
  Administered 2015-02-19: 0.5 mg via INTRAVENOUS
  Filled 2015-02-18 (×2): qty 1

## 2015-02-18 MED ORDER — LEVOTHYROXINE SODIUM 100 MCG PO TABS
100.0000 ug | ORAL_TABLET | Freq: Every day | ORAL | Status: DC
  Administered 2015-02-18 – 2015-02-21 (×4): 100 ug via ORAL
  Filled 2015-02-18 (×6): qty 1

## 2015-02-18 MED ORDER — ALUM & MAG HYDROXIDE-SIMETH 200-200-20 MG/5ML PO SUSP
30.0000 mL | Freq: Four times a day (QID) | ORAL | Status: DC | PRN
Start: 2015-02-18 — End: 2015-02-21

## 2015-02-18 MED ORDER — METOCLOPRAMIDE HCL 5 MG/ML IJ SOLN
10.0000 mg | Freq: Four times a day (QID) | INTRAMUSCULAR | Status: DC | PRN
  Administered 2015-02-18 (×2): 10 mg via INTRAVENOUS
  Filled 2015-02-18 (×2): qty 2

## 2015-02-18 MED ORDER — SODIUM CHLORIDE 0.9 % IV SOLN: INTRAVENOUS | Status: DC

## 2015-02-18 MED ORDER — ATORVASTATIN CALCIUM 80 MG PO TABS
80.0000 mg | ORAL_TABLET | Freq: Every day | ORAL | Status: DC
Start: 2015-02-18 — End: 2015-02-21
  Administered 2015-02-18 – 2015-02-20 (×3): 80 mg via ORAL
  Filled 2015-02-18 (×4): qty 1

## 2015-02-18 MED ORDER — DEXTROSE 5 % IV SOLN
1.0000 g | INTRAVENOUS | Status: DC
  Administered 2015-02-18 – 2015-02-20 (×3): 1 g via INTRAVENOUS
  Filled 2015-02-18 (×4): qty 10

## 2015-02-18 MED ORDER — TICAGRELOR 90 MG PO TABS
90.0000 mg | ORAL_TABLET | Freq: Two times a day (BID) | ORAL | Status: DC
  Administered 2015-02-18 – 2015-02-21 (×6): 90 mg via ORAL
  Filled 2015-02-18 (×8): qty 1

## 2015-02-18 MED ORDER — ONDANSETRON HCL 4 MG/2ML IJ SOLN: 4.0000 mg | Freq: Three times a day (TID) | INTRAMUSCULAR | Status: DC | PRN

## 2015-02-18 MED ORDER — ONDANSETRON HCL 4 MG/2ML IJ SOLN
4.0000 mg | Freq: Four times a day (QID) | INTRAMUSCULAR | Status: DC | PRN
  Administered 2015-02-18 – 2015-02-19 (×2): 4 mg via INTRAVENOUS
  Filled 2015-02-18 (×3): qty 2

## 2015-02-18 MED ORDER — HYDROMORPHONE HCL 1 MG/ML IJ SOLN: 0.5000 mg | INTRAMUSCULAR | Status: DC | PRN

## 2015-02-18 MED ORDER — PANTOPRAZOLE SODIUM 40 MG IV SOLR
40.0000 mg | INTRAVENOUS | Status: DC
  Administered 2015-02-18 – 2015-02-20 (×4): 40 mg via INTRAVENOUS
  Filled 2015-02-18 (×5): qty 40

## 2015-02-18 MED ORDER — METOPROLOL TARTRATE 12.5 MG HALF TABLET
12.5000 mg | ORAL_TABLET | Freq: Every day | ORAL | Status: DC
  Administered 2015-02-18 – 2015-02-19 (×2): 12.5 mg via ORAL
  Filled 2015-02-18 (×5): qty 1

## 2015-02-18 NOTE — Progress Notes (Signed)
[facility] PROGRESS NOTE  KENESHA MOSHIER MOQ:[id number] DOB: 12/09/1955 DOA: 02/17/2015 PCP: Elyn Peers, MD  Assessment/Plan: 1-Nausea vomiting:  CT abdomen negative.  IV fluids, IV zofran PRN.  GI consulted. Might need endoscopy   2-Right lower quadrant pain. CT abdomen negative.   3-CAD, S/P OM1 DES 11/04/14 IV Brilinta ordered Continue Oral medications once able to keep down PO   4-thyroid diseases; TSH . Continue with synthroid./   5-Pyuria: follow urine culture. Continue with ceftriaxone.      Code Status: full code Family Communication: care discussed with husband Disposition Plan: remain inpatient.    Consultants:  GI, [place]  Procedures:  none  Antibiotics:  none  HPI/Subjective: Nausea vomiting on and off for 1 month. Worse recently   Objective: Filed Vitals:   02/18/15 1418  BP: 108/79  Pulse: 52  Temp: 98.2 F (36.8 C)  Resp: 18    Intake/Output Summary (Last 24 hours) at 02/18/15 1649 Last data filed at 02/18/15 1300  Gross per 24 hour  Intake 2829.23 ml  Output      4 ml  Net 2825.23 ml   Filed Weights   02/17/15 2033 02/18/15 0152  Weight: 90.719 kg (200 lb) 91.128 kg (200 lb 14.4 oz)    Exam:   General:  Alert in no distress  Cardiovascular: S 1, S 2 RRR  Respiratory: CTA  Abdomen:BS present, soft, nt  Musculoskeletal: no edema  Data Reviewed: Basic Metabolic Panel:  Recent Labs Lab 02/17/15 2205 02/18/15 0525  NA 142 142  K 3.5 3.4*  CL 113* 112*  CO2 22 22  GLUCOSE 94 95  BUN 9 7  CREATININE 0.80 0.79  CALCIUM 8.1* 7.8*   Liver Function Tests:  Recent Labs Lab 02/17/15 2205  AST 30  ALT 32  ALKPHOS 88  BILITOT 1.0  PROT 7.0  ALBUMIN 3.9    Recent Labs Lab 02/17/15 2205  LIPASE 27   No results for input(s): AMMONIA in the last 168 hours. CBC:  Recent Labs Lab 02/17/15 2205 02/18/15 0525  WBC 8.9 9.0  NEUTROABS 5.1  --   HGB 12.3 11.3*  HCT 38.0 36.2  MCV 95.7 96.8   PLT 226 228   Cardiac Enzymes:  Recent Labs Lab 02/17/15 2205  TROPONINI <0.03   BNP (last 3 results) No results for input(s): BNP in the last 8760 hours.  ProBNP (last 3 results) No results for input(s): PROBNP in the last 8760 hours.  CBG: No results for input(s): GLUCAP in the last 168 hours.  No results found for this or any previous visit (from the past 240 hour(s)).   Studies: Ct Chest W Contrast  02/18/2015   CLINICAL DATA:  Lung nodule. Intermittent right lower quadrant pain with nausea vomiting for 2 weeks.  EXAM: CT CHEST, ABDOMEN, AND PELVIS WITH CONTRAST  TECHNIQUE: Multidetector CT imaging of the chest, abdomen and pelvis was performed following the standard protocol during bolus administration of intravenous contrast.  CONTRAST:  155mL OMNIPAQUE IOHEXOL 300 MG/ML  SOLN  COMPARISON:  Chest CT 11/02/2014.  FINDINGS: CT CHEST FINDINGS  Heart is normal size. Aorta is normal caliber. Left circumflex artery stents noted. No mediastinal, hilar, or axillary adenopathy. Chest wall soft tissues are unremarkable.  Lungs are clear. No focal airspace opacities or suspicious nodules. No effusions.  CT ABDOMEN AND PELVIS FINDINGS  Liver, gallbladder, spleen, pancreas, adrenals are unremarkable. Small cysts in the right kidney which appear benign. Left kidney unremarkable. No hydronephrosis.  Appendix is  visualized and is normal. There is right scratch head there are scattered diverticula in the right colon and sigmoid colon. No active diverticulitis. Stomach and small bowel are unremarkable.  Aorta is normal caliber. Uterus, adnexae and urinary bladder are unremarkable. No free fluid, free air or adenopathy.  Mild rightward thoracic scoliosis.  No acute bony abnormality.  IMPRESSION: No acute findings or significant abnormality in the chest, abdomen or pelvis.  Right renal cysts.  Scattered colonic diverticulosis.   Electronically Signed   By: Rolm Baptise M.D.   On: 02/18/2015 09:56   Ct  Abdomen Pelvis W Contrast  02/18/2015   CLINICAL DATA:  Lung nodule. Intermittent right lower quadrant pain with nausea vomiting for 2 weeks.  EXAM: CT CHEST, ABDOMEN, AND PELVIS WITH CONTRAST  TECHNIQUE: Multidetector CT imaging of the chest, abdomen and pelvis was performed following the standard protocol during bolus administration of intravenous contrast.  CONTRAST:  153mL OMNIPAQUE IOHEXOL 300 MG/ML  SOLN  COMPARISON:  Chest CT 11/02/2014.  FINDINGS: CT CHEST FINDINGS  Heart is normal size. Aorta is normal caliber. Left circumflex artery stents noted. No mediastinal, hilar, or axillary adenopathy. Chest wall soft tissues are unremarkable.  Lungs are clear. No focal airspace opacities or suspicious nodules. No effusions.  CT ABDOMEN AND PELVIS FINDINGS  Liver, gallbladder, spleen, pancreas, adrenals are unremarkable. Small cysts in the right kidney which appear benign. Left kidney unremarkable. No hydronephrosis.  Appendix is visualized and is normal. There is right scratch head there are scattered diverticula in the right colon and sigmoid colon. No active diverticulitis. Stomach and small bowel are unremarkable.  Aorta is normal caliber. Uterus, adnexae and urinary bladder are unremarkable. No free fluid, free air or adenopathy.  Mild rightward thoracic scoliosis.  No acute bony abnormality.  IMPRESSION: No acute findings or significant abnormality in the chest, abdomen or pelvis.  Right renal cysts.  Scattered colonic diverticulosis.   Electronically Signed   By: Rolm Baptise M.D.   On: 02/18/2015 09:56   Dg Chest Port 1 View  02/17/2015   CLINICAL DATA:  Nausea, heartburn and chest pain for 2 weeks.  EXAM: PORTABLE CHEST - 1 VIEW  COMPARISON:  Radiographs 01/31/2015.  CT 11/02/2014  FINDINGS: The cardiomediastinal contours are normal. The previously questioned nodular density projecting over the right upper lung on prior exam is not seen on the current exam. Pulmonary vasculature is normal. No  consolidation, pleural effusion, or pneumothorax. Unchanged mild scoliotic curvature of the spine. No acute osseous abnormalities are seen.  IMPRESSION: No acute pulmonary process. The previously questioned nodular opacity projecting over the right upper lung is not seen on the current exam.   Electronically Signed   By: Jeb Levering M.D.   On: 02/17/2015 21:47    Scheduled Meds: . aspirin  81 mg Oral Daily  . atorvastatin  80 mg Oral q1800  . enoxaparin (LOVENOX) injection  40 mg Subcutaneous Daily  . levothyroxine  100 mcg Oral QAC breakfast  . metoprolol tartrate  12.5 mg Oral Daily  . pantoprazole (PROTONIX) IV  40 mg Intravenous Q24H   Continuous Infusions: . sodium chloride 100 mL/hr at 02/18/15 1443  . cangrelor 50 mg in NS 250 mL 0.75 mcg/kg/min (02/18/15 1629)    Principal Problem:   Intractable nausea and vomiting Active Problems:   CAD S/P OM1 DES 11/04/14   Dyslipidemia   Smoker   Lung nodule   RLQ abdominal pain   Thyroid disease   Nausea with vomiting  Protein-calorie malnutrition, severe    Time spent: 35 minutes.     Niel Hummer A  [facility] Pager [phone]. If 7PM-7AM, please contact night-coverage at [email or web address], password [facility] 02/18/2015, 4:49 PM

## 2015-02-18 NOTE — Consult Note (Signed)
Consultation  Referring Provider:  [facility]    Primary Care Physician:  Elyn Peers, MD Primary Gastroenterologist: None. She was to see [facility] tomorrow      Reason for Consultation: nausea and vomiting         HPI:   Brittney Bowen is a 59 y.o. female admitted with two month history of intermittent nausea and vomiting. Patient was to see Dr. Deatra Ina in our office tomorrow but came to ED yesterday after several episodes of vomiting. No associated abdominal pain. Emesis mainly consists of undigested food, no blood. Symptoms not related to meals. Patient cannot correlate onset of nausea with any of her medications. She was taking Glucophage at home but it was apparently felt to be unnecessary and therefore discontinued. She has been taking Ativan TID for nausea but it hasn't helped. Patient started daily asa in May after myocardial infarction / stent placement.  No drastic weight loss associated with the nausea/ vomiting. Her bowels are regular, just occasional constipation. LFTs, lipase normal. WBC normal. HGB slightly low after IV hydration. CTscan without contrast is unremarkable.   No other GI, or general medical complaints. Coughing some in the hospital ( mucous).   Past Medical History  Diagnosis Date  . Thyroid disease   . Heart attack     Past Surgical History  Procedure Laterality Date  . Thyroidectomy    . Cardiac catheterization N/A 11/04/2014    Procedure: Left Heart Cath and Coronary Angiography;  Surgeon: Jettie Booze, MD;  Location: [place] [facility];  Service: Cardiovascular;  Laterality: N/A;  . Cardiac catheterization Right 11/04/2014    Procedure: Coronary Stent Intervention;  Surgeon: Jettie Booze, MD;  Location: [place] [facility];  Service: Cardiovascular;  Laterality: Right;    Family History  Problem Relation Age of Onset  . Pulmonary embolism Mother   . CAD Father   . Diabetes Mellitus II Father   . Heart attack Mother   .  Heart attack Father   . Hypertension Maternal Grandmother   . Stroke Maternal Grandmother   . Diabetes Father      Social History  Substance Use Topics  . Smoking status: Current Every Day Smoker  . Smokeless tobacco: None  . Alcohol Use: No    Prior to Admission medications   Medication Sig Start Date End Date Taking? Authorizing Provider  acetaminophen (TYLENOL) 325 MG tablet Take 2 tablets (650 mg total) by mouth every 4 (four) hours as needed for headache or mild pain. 11/05/14  Yes Erlene Quan, PA-C  aspirin 81 MG chewable tablet Chew 1 tablet (81 mg total) by mouth daily. 11/05/14  Yes Erlene Quan, PA-C  atorvastatin (LIPITOR) 80 MG tablet Take 1 tablet (80 mg total) by mouth daily at 6 PM. 11/05/14  Yes Erlene Quan, PA-C  levothyroxine (SYNTHROID, LEVOTHROID) 100 MCG tablet Take 1 tablet (100 mcg total) by mouth daily before breakfast. 11/13/14  Yes Liliane Shi, PA-C  LORazepam (ATIVAN) 0.5 MG tablet Take 0.5 mg by mouth 3 (three) times daily.   Yes Historical Provider, MD  metoprolol tartrate (LOPRESSOR) 25 MG tablet Take 12.5 mg by mouth daily.   Yes Historical Provider, MD  nitroGLYCERIN (NITROSTAT) 0.4 MG SL tablet Place 1 tablet (0.4 mg total) under the tongue every 5 (five) minutes as needed for chest pain. 11/05/14  Yes Erlene Quan, PA-C  ticagrelor (BRILINTA) 90 MG TABS tablet Take 1 tablet (90 mg  total) by mouth 2 (two) times daily. 11/05/14  Yes Erlene Quan, PA-C    Current Facility-Administered Medications  Medication Dose Route Frequency Provider Last Rate Last Dose  . 0.9 %  sodium chloride infusion   Intravenous Continuous Theressa Millard, MD 100 mL/hr at 02/18/15 0229    . acetaminophen (TYLENOL) tablet 650 mg  650 mg Oral Q6H PRN Theressa Millard, MD       Or  . acetaminophen (TYLENOL) suppository 650 mg  650 mg Rectal Q6H PRN Theressa Millard, MD      . alum & mag hydroxide-simeth (MAALOX/MYLANTA) 200-200-20 MG/5ML suspension 30 mL  30 mL Oral Q6H PRN  Theressa Millard, MD      . aspirin chewable tablet 81 mg  81 mg Oral Daily Theressa Millard, MD   81 mg at 02/18/15 0956  . atorvastatin (LIPITOR) tablet 80 mg  80 mg Oral q1800 Theressa Millard, MD      . cangrelor [facility]) 50 mg in sodium chloride 0.9 % 250 mL (0.2 mg/mL) infusion  0.75 mcg/kg/min Intravenous Continuous Theressa Millard, MD 20.5 mL/hr at 02/18/15 0750 0.75 mcg/kg/min at 02/18/15 0750  . enoxaparin (LOVENOX) injection 40 mg  40 mg Subcutaneous Daily Theressa Millard, MD   40 mg at 02/18/15 0957  . HYDROmorphone (DILAUDID) injection 0.5-1 mg  0.5-1 mg Intravenous Q3H PRN Theressa Millard, MD      . levothyroxine (SYNTHROID, LEVOTHROID) tablet 100 mcg  100 mcg Oral QAC breakfast Theressa Millard, MD   100 mcg at 02/18/15 0955  . LORazepam (ATIVAN) injection 0.5 mg  0.5 mg Intravenous BID PRN Theressa Millard, MD      . metoCLOPramide (REGLAN) injection 10 mg  10 mg Intravenous Q6H PRN Theressa Millard, MD   10 mg at 02/18/15 1001  . metoprolol tartrate (LOPRESSOR) tablet 12.5 mg  12.5 mg Oral Daily Theressa Millard, MD   12.5 mg at 02/18/15 0957  . nitroGLYCERIN (NITROSTAT) SL tablet 0.4 mg  0.4 mg Sublingual Q5 min PRN Theressa Millard, MD      . ondansetron (ZOFRAN) tablet 4 mg  4 mg Oral Q6H PRN Theressa Millard, MD       Or  . ondansetron (ZOFRAN) injection 4 mg  4 mg Intravenous Q6H PRN Theressa Millard, MD   4 mg at 02/18/15 0656  . oxyCODONE (Oxy IR/ROXICODONE) immediate release tablet 5 mg  5 mg Oral Q4H PRN Theressa Millard, MD   5 mg at 02/18/15 0229  . pantoprazole (PROTONIX) injection 40 mg  40 mg Intravenous Q24H Theressa Millard, MD   40 mg at 02/18/15 0119    Allergies as of 02/17/2015 - Review Complete 02/17/2015  Allergen Reaction Noted  . Blue dyes (parenteral) Anaphylaxis 09/15/2014    Review of Systems:    As per HPI, otherwise negative    Physical Exam:  Vital signs in last 24 hours: Temp:  [97.5 F (36.4 C)-97.9 F  (36.6 C)] 97.8 F (36.6 C) (09/14 1000) Pulse Rate:  [51-74] 54 (09/14 1000) Resp:  [18-22] 18 (09/14 1000) BP: (124-150)/(55-84) 131/69 mmHg (09/14 1000) SpO2:  [97 %-100 %] 100 % (09/14 1000) Weight:  [200 lb (90.719 kg)-200 lb 14.4 oz (91.128 kg)] 200 lb 14.4 oz (91.128 kg) (09/14 0152)   General:   Pleasant black female in NAD Head:  Normocephalic and atraumatic. Eyes:   No icterus.   Conjunctiva pink. Ears:  Normal  auditory acuity. Neck:  Supple Lungs:  Respirations even and unlabored. Lungs clear to auscultation bilaterally.   No wheezes, crackles, or rhonchi.  Heart:  Regular rate and rhythm; no MRG Abdomen:  Soft, nondistended, nontender. Normal bowel sounds. No appreciable masses or hepatomegaly. Rectal:  Not performed.  Msk:  Symmetrical without gross deformities.  Extremities:  Without edema. Neurologic:  Alert and  oriented x4;  grossly normal neurologically. Skin:  Intact without significant lesions or rashes. Psych:  Alert and cooperative. Normal affect.  LAB RESULTS:  Recent Labs  02/17/15 2205 02/18/15 0525  WBC 8.9 9.0  HGB 12.3 11.3*  HCT 38.0 36.2  PLT 226 228   BMET  Recent Labs  02/17/15 2205 02/18/15 0525  NA 142 142  K 3.5 3.4*  CL 113* 112*  CO2 22 22  GLUCOSE 94 95  BUN 9 7  CREATININE 0.80 0.79  CALCIUM 8.1* 7.8*   LFT  Recent Labs  02/17/15 2205  PROT 7.0  ALBUMIN 3.9  AST 30  ALT 32  ALKPHOS 88  BILITOT 1.0   PT/INR  Recent Labs  02/18/15 0525  LABPROT 13.6  INR 1.02    STUDIES:  Ct Abdomen Pelvis W Contrast  02/18/2015   CLINICAL DATA:  Lung nodule. Intermittent right lower quadrant pain with nausea vomiting for 2 weeks.  EXAM: CT CHEST, ABDOMEN, AND PELVIS WITH CONTRAST  TECHNIQUE: Multidetector CT imaging of the chest, abdomen and pelvis was performed following the standard protocol during bolus administration of intravenous contrast.  CONTRAST:  151mL OMNIPAQUE IOHEXOL 300 MG/ML  SOLN  COMPARISON:  Chest CT  11/02/2014.  FINDINGS: CT CHEST FINDINGS  Heart is normal size. Aorta is normal caliber. Left circumflex artery stents noted. No mediastinal, hilar, or axillary adenopathy. Chest wall soft tissues are unremarkable.  Lungs are clear. No focal airspace opacities or suspicious nodules. No effusions.  CT ABDOMEN AND PELVIS FINDINGS  Liver, gallbladder, spleen, pancreas, adrenals are unremarkable. Small cysts in the right kidney which appear benign. Left kidney unremarkable. No hydronephrosis.  Appendix is visualized and is normal. There is right scratch head there are scattered diverticula in the right colon and sigmoid colon. No active diverticulitis. Stomach and small bowel are unremarkable.  Aorta is normal caliber. Uterus, adnexae and urinary bladder are unremarkable. No free fluid, free air or adenopathy.  Mild rightward thoracic scoliosis.  No acute bony abnormality.  IMPRESSION: No acute findings or significant abnormality in the chest, abdomen or pelvis.  Right renal cysts.  Scattered colonic diverticulosis.   Electronically Signed   By: Rolm Baptise M.D.   On: 02/18/2015 09:56     PREVIOUS ENDOSCOPIES:            No colonoscopy in 10 year or more.    Impression / Plan:   48. 59 year old black female with two month history of intermittent nausea and vomiting. Rule out NSAID induced gastropathy / PUD. No associated abdominal pian or significant weight loss which is reassuring. CTscan, labs unremarkable. For further evaluation of nausea / vomiting patient will be scheduled for EGD. Probably can't do much in the way of biopsies given Brillinta. The benefits, risks, and potential complications of EGD with possible biopsies  were discussed with the patient and she agrees to proceed.   2.Colon cancer screening. This can be done outpatient.   3. CAD / MI in May, s/p stent placement. On Brillinta.   4. Thyroidectomy for goiter. On replacement therapy.    Thanks  Tye Savoy  02/18/2015, 2:01  PM  Attending Addendum: I have taken an interval history, reviewed the chart, and examined the patient. I agree with the Advanced Practitioner's note and impression. 59 y/o female with 2 months of progressive nausea / vomiting. Labs unremarkable. CT scan without obstructive changes. No recent medication changes that would be related. Agree with EGD as the next step in her evaluation. Discussed risks / benefits of EGD with patient and her family. They agree to proceed. Safe to proceed while on Brilinta and can biopsy safely in this setting.   [place] Cellar, MD [facility] Pager [phone]

## 2015-02-18 NOTE — H&P (Signed)
[facility] Admission History and Physical       Brittney Bowen YQM:[id number] DOB: February 22, 1956 DOA: 02/17/2015  Referring physician: EDP PCP: Elyn Peers, MD  Specialists:   Chief Complaint: Nausea and Vomiting  HPI: Brittney Bowen is a 59 y.o. female with a history of CAD S/P PCI with DES Stent 10/2014 who presents to the ED with complaints of intractable Nausea and vomiting for the past 4 weeks worse over the past 3-4 days.  She also reports haviing RLQ ABD Pain.     She has not been able to hold down foods and liquids or her medications.  She denies any hematemesis, fevers or chills or diarrhea.   She last saw her cardiologist on 02/12/2015 and was referred to see Dr Deatra Ina GI for evaluation.   She contacted her cardiologist regarding her symptoms and reported that she has not been able to hold down her medicationss including the Brillinta Rx over the past 3 days, so she was advised to got to the ED.      Review of Systems:  Constitutional: No Weight Loss, No Weight Gain, Night Sweats, Fevers, Chills, Dizziness, Light Headedness, Fatigue, or Generalized Weakness HEENT: No Headaches, Difficulty Swallowing,Tooth/Dental Problems,Sore Throat,  No Sneezing, Rhinitis, Ear Ache, Nasal Congestion, or Post Nasal Drip,  Cardio-vascular:  No Chest pain, Orthopnea, PND, Edema in Lower Extremities, Anasarca, Dizziness, Palpitations  Resp: No Dyspnea, No DOE, No Productive Cough, No Non-Productive Cough, No Hemoptysis, No Wheezing.    GI: No Heartburn, Indigestion, +Abdominal Pain, +Nausea, +Vomiting, Diarrhea, Constipation, Hematemesis, Hematochezia, Melena, Change in Bowel Habits,  Loss of Appetite  GU: No Dysuria, No Change in Color of Urine, No Urgency or Urinary Frequency, No Flank pain.  Musculoskeletal: No Joint Pain or Swelling, No Decreased Range of Motion, No Back Pain.  Neurologic: No Syncope, No Seizures, Muscle Weakness, Paresthesia, Vision Disturbance or Loss, No  Diplopia, No Vertigo, No Difficulty Walking,  Skin: No Rash or Lesions. Psych: No Change in Mood or Affect, No Depression or Anxiety, No Memory loss, No Confusion, or Hallucinations   Past Medical History  Diagnosis Date  . Thyroid disease   . Heart attack      Past Surgical History  Procedure Laterality Date  . Thyroidectomy    . Cardiac catheterization N/A 11/04/2014    Procedure: Left Heart Cath and Coronary Angiography;  Surgeon: Jettie Booze, MD;  Location: [facility];  Service: Cardiovascular;  Laterality: N/A;  . Cardiac catheterization Right 11/04/2014    Procedure: Coronary Stent Intervention;  Surgeon: Jettie Booze, MD;  Location: [facility];  Service: Cardiovascular;  Laterality: Right;      Prior to Admission medications   Medication Sig Start Date End Date Taking? Authorizing Provider  acetaminophen (TYLENOL) 325 MG tablet Take 2 tablets (650 mg total) by mouth every 4 (four) hours as needed for headache or mild pain. 11/05/14  Yes Erlene Quan, PA-C  aspirin 81 MG chewable tablet Chew 1 tablet (81 mg total) by mouth daily. 11/05/14  Yes Erlene Quan, PA-C  atorvastatin (LIPITOR) 80 MG tablet Take 1 tablet (80 mg total) by mouth daily at 6 PM. 11/05/14  Yes Erlene Quan, PA-C  levothyroxine (SYNTHROID, LEVOTHROID) 100 MCG tablet Take 1 tablet (100 mcg total) by mouth daily before breakfast. 11/13/14  Yes Liliane Shi, PA-C  LORazepam (ATIVAN) 0.5 MG tablet Take 0.5 mg by mouth 3 (three) times daily.   Yes Historical Provider, MD  metoprolol tartrate (LOPRESSOR) 25 MG tablet Take 12.5 mg by mouth daily.   Yes Historical Provider, MD  nitroGLYCERIN (NITROSTAT) 0.4 MG SL tablet Place 1 tablet (0.4 mg total) under the tongue every 5 (five) minutes as needed for chest pain. 11/05/14  Yes Erlene Quan, PA-C  ticagrelor (BRILINTA) 90 MG TABS tablet Take 1 tablet (90 mg total) by mouth 2 (two) times daily. 11/05/14  Yes Luke K Kilroy, PA-C  metFORMIN  (GLUCOPHAGE) 500 MG tablet Take 500 mg by mouth at bedtime.    Historical Provider, MD     Allergies  Allergen Reactions  . Blue Dyes (Parenteral) Anaphylaxis    Blueberries - anaphylaxis     Social History:  reports that she has been smoking.  She does not have any smokeless tobacco history on file. She reports that she does not drink alcohol or use illicit drugs.    Family History  Problem Relation Age of Onset  . Pulmonary embolism Mother   . CAD Father   . Diabetes Mellitus II Father   . Heart attack Mother   . Heart attack Father   . Hypertension Maternal Grandmother   . Stroke Maternal Grandmother   . Diabetes Father        Physical Exam:  GEN:  Pleasant Well Nourished and Well Developed 59 y.o. African American female examined and in no acute distress; cooperative with exam Filed Vitals:   02/17/15 2033 02/17/15 2055 02/17/15 2135 02/17/15 2200  BP: 150/84 138/55 138/55 130/83  Pulse: 74 63 63 60  Temp: 97.9 F (36.6 C) 97.9 F (36.6 C) 97.9 F (36.6 C) 97.5 F (36.4 C)  TempSrc: Oral Oral Oral Oral  Resp: 20 20 22 20   Weight: 90.719 kg (200 lb)     SpO2: 97% 100% 97% 99%   Blood pressure 130/83, pulse 60, temperature 97.5 F (36.4 C), temperature source Oral, resp. rate 20, weight 90.719 kg (200 lb), SpO2 99 %. PSYCH: She is alert and oriented x4; does not appear anxious does not appear depressed; affect is normal HEENT: Normocephalic and Atraumatic, Mucous membranes pink; PERRLA; EOM intact; Fundi:  Benign;  No scleral icterus, Nares: Patent, Oropharynx: Clear, Fair Dentition,    Neck:  FROM, No Cervical Lymphadenopathy nor Thyromegaly or Carotid Bruit; No JVD; Breasts:: Not examined CHEST WALL: No tenderness CHEST: Normal respiration, clear to auscultation bilaterally HEART: Regular rate and rhythm; no murmurs rubs or gallops BACK: No kyphosis or scoliosis; No CVA tenderness ABDOMEN: Positive Bowel Sounds, Soft Non-Tender, No Rebound or Guarding; No  Masses, No Organomegaly Rectal Exam: Not done EXTREMITIES: No Cyanosis, Clubbing, or Edema; No Ulcerations. Genitalia: not examined PULSES: 2+ and symmetric SKIN: Normal hydration no rash or ulceration CNS:  Alert and Oriented x 4, No Focal Deficits Vascular: pulses palpable throughout    Labs on Admission:  Basic Metabolic Panel:  Recent Labs Lab 02/17/15 2205  NA 142  K 3.5  CL 113*  CO2 22  GLUCOSE 94  BUN 9  CREATININE 0.80  CALCIUM 8.1*   Liver Function Tests:  Recent Labs Lab 02/17/15 2205  AST 30  ALT 32  ALKPHOS 88  BILITOT 1.0  PROT 7.0  ALBUMIN 3.9    Recent Labs Lab 02/17/15 2205  LIPASE 27   No results for input(s): AMMONIA in the last 168 hours. CBC:  Recent Labs Lab 02/17/15 2205  WBC 8.9  NEUTROABS 5.1  HGB 12.3  HCT 38.0  MCV 95.7  PLT 226   Cardiac  Enzymes:  Recent Labs Lab 02/17/15 2205  TROPONINI <0.03    BNP (last 3 results) No results for input(s): BNP in the last 8760 hours.  ProBNP (last 3 results) No results for input(s): PROBNP in the last 8760 hours.  CBG: No results for input(s): GLUCAP in the last 168 hours.  Radiological Exams on Admission: Dg Chest Port 1 View  02/17/2015   CLINICAL DATA:  Nausea, heartburn and chest pain for 2 weeks.  EXAM: PORTABLE CHEST - 1 VIEW  COMPARISON:  Radiographs 01/31/2015.  CT 11/02/2014  FINDINGS: The cardiomediastinal contours are normal. The previously questioned nodular density projecting over the right upper lung on prior exam is not seen on the current exam. Pulmonary vasculature is normal. No consolidation, pleural effusion, or pneumothorax. Unchanged mild scoliotic curvature of the spine. No acute osseous abnormalities are seen.  IMPRESSION: No acute pulmonary process. The previously questioned nodular opacity projecting over the right upper lung is not seen on the current exam.   Electronically Signed   By: Jeb Levering M.D.   On: 02/17/2015 21:47     EKG:  Independently reviewed. Normal sinus Rhythm Rate =76, Non-Specific S-T Changes     Assessment/Plan:    59 y.o. female with  Principal Problem:   1.     Intractable nausea and vomiting   PRN IV Zofran    PRN IV Reglan   IVFs   Please Consult GI Dr Deatra Ina this AM   Active Problems:   2.    RLQ abdominal pain   Ct Scan of ABD/Pelvis ordered       3.    CAD S/P OM1 DES 11/04/14   IV Brilinta ordered   Continue Oral medications once able to keep down PO     4.    Dyslipidemia   Resume Atorvasttin when able to take PO     5.   Lung nodule   CT scan of the Chest ordered     6.   Thyroid disease   Check TSH   Resume Levothyroxine when able to take PO     7.   Smoker   Nicotine Patch 7 mg daily   Continue to decrease smoking until quit     8.   DVT Prophylaxis   Lovenox    Code Status:     FULL CODE  Family Communication:   Husband and Family at Bedside      Disposition Plan:    Inpatient  Status        Time spent:  [facility] Pager [phone]   If [place] Please Contact the Day Rounding Team MD for [facility]  If 7PM-7AM, Please Contact Night-Floor Coverage  [email or web address] Password [email or web address] 02/18/2015, 12:30 AM     ADDENDUM:   Patient was seen and examined on 02/18/2015

## 2015-02-18 NOTE — Progress Notes (Signed)
Initial Nutrition Assessment  DOCUMENTATION CODES:   Severe malnutrition in context of acute illness/injury, Obesity unspecified  INTERVENTION:  - Advance diet as medically feasible - RD will continue to monitor for needs, including need for supplement  NUTRITION DIAGNOSIS:   Inadequate oral intake related to acute illness, nausea, vomiting as evidenced by per patient/family report.  GOAL:   Patient will meet greater than or equal to 90% of their needs  MONITOR:   Diet advancement, Weight trends, Labs, I & O's  REASON FOR ASSESSMENT:   Malnutrition Screening Tool  ASSESSMENT:   59 y.o. female with a history of CAD S/P PCI with DES Stent 10/2014 who presents to the ED with complaints of intractable Nausea and vomiting for the past 4 weeks worse over the past 3-4 days. She also reports haviing RLQ ABD Pain. She has not been able to hold down foods and liquids or her medications. She denies any hematemesis, fevers or chills or diarrhea. She last saw her cardiologist on 02/12/2015 and was referred to see Dr Deatra Ina GI for evaluation. She contacted her cardiologist regarding her symptoms and reported that she has not been able to hold down her medicationss including the Brillinta Rx over the past 3 days, so she was advised to got to the ED.   Pt seen for MST. BMI indicates obesity. Pt on CLD since admission and consumed 100% jello and broth and most of lemon [place] ice. She denies abdominal pain or nausea with intakes of lunch.   Her last episode of emesis was this AM after consuming contrast for CT. She states nausea and vomiting was occuring on and off for the past 2 months. For the past 4 days she was unable to keep anything down and prior to that she required very bland foods such as toast.   Pt states that in August there was a week when she was unable to eat at all and lost 10 lbs during that time. This indicates 5% wt loss in that time which is significant for time  frame.  No muscle or fat wasting noted and pt denies any further weight loss since August.   She is very interested in diet advancement and being able to consume solid foods. Will monitor for diet advancement and assess if nutrition supplements are needed at that time.   Unable to meet needs on CLD. Medications reviewed. Labs reviewed; K: 3.4 mmol/L, Cl: 112 mmol/L, Ca: 7.8 mg/dL.   Diet Order:  Diet clear liquid Room service appropriate?: Yes; Fluid consistency:: Thin  Skin:  Reviewed, no issues  Last BM:  PTA  Height:   Ht Readings from Last 1 Encounters:  02/18/15 5' 8.25" (1.734 m)    Weight:   Wt Readings from Last 1 Encounters:  02/18/15 200 lb 14.4 oz (91.128 kg)    Ideal Body Weight:  63.64 kg (kg)  BMI:  Body mass index is 30.31 kg/(m^2).  Estimated Nutritional Needs:   Kcal:  1800-2000  Protein:  75-85 grams  Fluid:  >/= 2.2 L/day  EDUCATION NEEDS:   No education needs identified at this time     Jarome Matin, RD, LDN Inpatient Clinical Dietitian Pager # [phone] After hours/weekend pager # [phone]

## 2015-02-19 ENCOUNTER — Encounter (HOSPITAL_COMMUNITY): Payer: Self-pay

## 2015-02-19 ENCOUNTER — Inpatient Hospital Stay (HOSPITAL_COMMUNITY): Payer: Medicaid Other | Admitting: Anesthesiology

## 2015-02-19 ENCOUNTER — Ambulatory Visit: Payer: Medicaid Other | Admitting: Gastroenterology

## 2015-02-19 ENCOUNTER — Encounter (HOSPITAL_COMMUNITY)
Admission: EM | Disposition: A | Discharge status: Home / Self Care | Payer: Self-pay | Source: Home / Self Care | Attending: Internal Medicine

## 2015-02-19 DIAGNOSIS — E785 Hyperlipidemia, unspecified: Secondary | ICD-10-CM

## 2015-02-19 HISTORY — PX: ESOPHAGOGASTRODUODENOSCOPY (EGD) WITH PROPOFOL: SHX5813

## 2015-02-19 LAB — CBC
HEMATOCRIT: 35.6 % — AB (ref 36.0–46.0)
Hemoglobin: 11.2 g/dL — ABNORMAL LOW (ref 12.0–15.0)
MCH: 30.4 pg (ref 26.0–34.0)
MCHC: 31.5 g/dL (ref 30.0–36.0)
MCV: 96.5 fL (ref 78.0–100.0)
Platelets: 192 10*3/uL (ref 150–400)
RBC: 3.69 MIL/uL — AB (ref 3.87–5.11)
RDW: 13.7 % (ref 11.5–15.5)
WBC: 6.8 10*3/uL (ref 4.0–10.5)

## 2015-02-19 LAB — URINE CULTURE

## 2015-02-19 LAB — BASIC METABOLIC PANEL
ANION GAP: 6 (ref 5–15)
BUN: <5 mg/dL — ABNORMAL LOW (ref 6–20)
CO2: 26 mmol/L (ref 22–32)
Calcium: 8.1 mg/dL — ABNORMAL LOW (ref 8.9–10.3)
Chloride: 113 mmol/L — ABNORMAL HIGH (ref 101–111)
Creatinine, Ser: 0.88 mg/dL (ref 0.44–1.00)
GLUCOSE: 98 mg/dL (ref 65–99)
POTASSIUM: 3.9 mmol/L (ref 3.5–5.1)
Sodium: 145 mmol/L (ref 135–145)

## 2015-02-19 LAB — TSH: TSH: 0.62 u[IU]/mL (ref 0.350–4.500)

## 2015-02-19 SURGERY — ESOPHAGOGASTRODUODENOSCOPY (EGD) WITH PROPOFOL
Anesthesia: Monitor Anesthesia Care

## 2015-02-19 MED ORDER — PROPOFOL 10 MG/ML IV BOLUS
INTRAVENOUS | Status: AC
  Filled 2015-02-19: qty 20

## 2015-02-19 MED ORDER — PROPOFOL INFUSION 10 MG/ML OPTIME
INTRAVENOUS | Status: DC | PRN
  Administered 2015-02-19: 140 ug/kg/min via INTRAVENOUS

## 2015-02-19 MED ORDER — LACTATED RINGERS IV SOLN
INTRAVENOUS | Status: DC | PRN
  Administered 2015-02-19: 08:00:00 via INTRAVENOUS

## 2015-02-19 MED ORDER — MIDAZOLAM HCL 5 MG/5ML IJ SOLN
INTRAMUSCULAR | Status: DC | PRN
  Administered 2015-02-19: 1 mg via INTRAVENOUS

## 2015-02-19 MED ORDER — ESMOLOL HCL 10 MG/ML IV SOLN
INTRAVENOUS | Status: AC
  Filled 2015-02-19: qty 10

## 2015-02-19 MED ORDER — LIDOCAINE HCL (CARDIAC) 20 MG/ML IV SOLN
INTRAVENOUS | Status: DC | PRN
  Administered 2015-02-19: 100 mg via INTRAVENOUS

## 2015-02-19 MED ORDER — LIDOCAINE HCL (CARDIAC) 20 MG/ML IV SOLN
INTRAVENOUS | Status: AC
  Filled 2015-02-19: qty 5

## 2015-02-19 MED ORDER — PROPOFOL 10 MG/ML IV BOLUS
INTRAVENOUS | Status: DC | PRN
  Administered 2015-02-19 (×2): 20 mg via INTRAVENOUS
  Administered 2015-02-19: 10 mg via INTRAVENOUS
  Administered 2015-02-19: 30 mg via INTRAVENOUS

## 2015-02-19 MED ORDER — MIDAZOLAM HCL 2 MG/2ML IJ SOLN
INTRAMUSCULAR | Status: AC
  Filled 2015-02-19: qty 4

## 2015-02-19 NOTE — H&P (View-Only) (Signed)
Consultation  Referring Provider:  [facility]    Primary Care Physician:  Elyn Peers, MD Primary Gastroenterologist: None. She was to see [facility] tomorrow      Reason for Consultation: nausea and vomiting         HPI:   Brittney Bowen is a 59 y.o. female admitted with two month history of intermittent nausea and vomiting. Patient was to see Dr. Deatra Ina in our office tomorrow but came to ED yesterday after several episodes of vomiting. No associated abdominal pain. Emesis mainly consists of undigested food, no blood. Symptoms not related to meals. Patient cannot correlate onset of nausea with any of her medications. She was taking Glucophage at home but it was apparently felt to be unnecessary and therefore discontinued. She has been taking Ativan TID for nausea but it hasn't helped. Patient started daily asa in May after myocardial infarction / stent placement.  No drastic weight loss associated with the nausea/ vomiting. Her bowels are regular, just occasional constipation. LFTs, lipase normal. WBC normal. HGB slightly low after IV hydration. CTscan without contrast is unremarkable.   No other GI, or general medical complaints. Coughing some in the hospital ( mucous).   Past Medical History  Diagnosis Date  . Thyroid disease   . Heart attack     Past Surgical History  Procedure Laterality Date  . Thyroidectomy    . Cardiac catheterization N/A 11/04/2014    Procedure: Left Heart Cath and Coronary Angiography;  Surgeon: Jettie Booze, MD;  Location: [facility];  Service: Cardiovascular;  Laterality: N/A;  . Cardiac catheterization Right 11/04/2014    Procedure: Coronary Stent Intervention;  Surgeon: Jettie Booze, MD;  Location: [facility];  Service: Cardiovascular;  Laterality: Right;    Family History  Problem Relation Age of Onset  . Pulmonary embolism Mother   . CAD Father   . Diabetes Mellitus II Father   . Heart attack Mother   .  Heart attack Father   . Hypertension Maternal Grandmother   . Stroke Maternal Grandmother   . Diabetes Father      Social History  Substance Use Topics  . Smoking status: Current Every Day Smoker  . Smokeless tobacco: None  . Alcohol Use: No    Prior to Admission medications   Medication Sig Start Date End Date Taking? Authorizing Provider  acetaminophen (TYLENOL) 325 MG tablet Take 2 tablets (650 mg total) by mouth every 4 (four) hours as needed for headache or mild pain. 11/05/14  Yes Erlene Quan, PA-C  aspirin 81 MG chewable tablet Chew 1 tablet (81 mg total) by mouth daily. 11/05/14  Yes Erlene Quan, PA-C  atorvastatin (LIPITOR) 80 MG tablet Take 1 tablet (80 mg total) by mouth daily at 6 PM. 11/05/14  Yes Erlene Quan, PA-C  levothyroxine (SYNTHROID, LEVOTHROID) 100 MCG tablet Take 1 tablet (100 mcg total) by mouth daily before breakfast. 11/13/14  Yes Liliane Shi, PA-C  LORazepam (ATIVAN) 0.5 MG tablet Take 0.5 mg by mouth 3 (three) times daily.   Yes Historical Provider, MD  metoprolol tartrate (LOPRESSOR) 25 MG tablet Take 12.5 mg by mouth daily.   Yes Historical Provider, MD  nitroGLYCERIN (NITROSTAT) 0.4 MG SL tablet Place 1 tablet (0.4 mg total) under the tongue every 5 (five) minutes as needed for chest pain. 11/05/14  Yes Erlene Quan, PA-C  ticagrelor (BRILINTA) 90 MG TABS tablet Take 1 tablet (90 mg  total) by mouth 2 (two) times daily. 11/05/14  Yes Erlene Quan, PA-C    Current Facility-Administered Medications  Medication Dose Route Frequency Provider Last Rate Last Dose  . 0.9 %  sodium chloride infusion   Intravenous Continuous Theressa Millard, MD 100 mL/hr at 02/18/15 0229    . acetaminophen (TYLENOL) tablet 650 mg  650 mg Oral Q6H PRN Theressa Millard, MD       Or  . acetaminophen (TYLENOL) suppository 650 mg  650 mg Rectal Q6H PRN Theressa Millard, MD      . alum & mag hydroxide-simeth (MAALOX/MYLANTA) 200-200-20 MG/5ML suspension 30 mL  30 mL Oral Q6H PRN  Theressa Millard, MD      . aspirin chewable tablet 81 mg  81 mg Oral Daily Theressa Millard, MD   81 mg at 02/18/15 0956  . atorvastatin (LIPITOR) tablet 80 mg  80 mg Oral q1800 Theressa Millard, MD      . cangrelor [facility]) 50 mg in sodium chloride 0.9 % 250 mL (0.2 mg/mL) infusion  0.75 mcg/kg/min Intravenous Continuous Theressa Millard, MD 20.5 mL/hr at 02/18/15 0750 0.75 mcg/kg/min at 02/18/15 0750  . enoxaparin (LOVENOX) injection 40 mg  40 mg Subcutaneous Daily Theressa Millard, MD   40 mg at 02/18/15 0957  . HYDROmorphone (DILAUDID) injection 0.5-1 mg  0.5-1 mg Intravenous Q3H PRN Theressa Millard, MD      . levothyroxine (SYNTHROID, LEVOTHROID) tablet 100 mcg  100 mcg Oral QAC breakfast Theressa Millard, MD   100 mcg at 02/18/15 0955  . LORazepam (ATIVAN) injection 0.5 mg  0.5 mg Intravenous BID PRN Theressa Millard, MD      . metoCLOPramide (REGLAN) injection 10 mg  10 mg Intravenous Q6H PRN Theressa Millard, MD   10 mg at 02/18/15 1001  . metoprolol tartrate (LOPRESSOR) tablet 12.5 mg  12.5 mg Oral Daily Theressa Millard, MD   12.5 mg at 02/18/15 0957  . nitroGLYCERIN (NITROSTAT) SL tablet 0.4 mg  0.4 mg Sublingual Q5 min PRN Theressa Millard, MD      . ondansetron (ZOFRAN) tablet 4 mg  4 mg Oral Q6H PRN Theressa Millard, MD       Or  . ondansetron (ZOFRAN) injection 4 mg  4 mg Intravenous Q6H PRN Theressa Millard, MD   4 mg at 02/18/15 0656  . oxyCODONE (Oxy IR/ROXICODONE) immediate release tablet 5 mg  5 mg Oral Q4H PRN Theressa Millard, MD   5 mg at 02/18/15 0229  . pantoprazole (PROTONIX) injection 40 mg  40 mg Intravenous Q24H Theressa Millard, MD   40 mg at 02/18/15 0119    Allergies as of 02/17/2015 - Review Complete 02/17/2015  Allergen Reaction Noted  . Blue dyes (parenteral) Anaphylaxis 09/15/2014    Review of Systems:    As per HPI, otherwise negative    Physical Exam:  Vital signs in last 24 hours: Temp:  [97.5 F (36.4 C)-97.9 F  (36.6 C)] 97.8 F (36.6 C) (09/14 1000) Pulse Rate:  [51-74] 54 (09/14 1000) Resp:  [18-22] 18 (09/14 1000) BP: (124-150)/(55-84) 131/69 mmHg (09/14 1000) SpO2:  [97 %-100 %] 100 % (09/14 1000) Weight:  [200 lb (90.719 kg)-200 lb 14.4 oz (91.128 kg)] 200 lb 14.4 oz (91.128 kg) (09/14 0152)   General:   Pleasant black female in NAD Head:  Normocephalic and atraumatic. Eyes:   No icterus.   Conjunctiva pink. Ears:  Normal  auditory acuity. Neck:  Supple Lungs:  Respirations even and unlabored. Lungs clear to auscultation bilaterally.   No wheezes, crackles, or rhonchi.  Heart:  Regular rate and rhythm; no MRG Abdomen:  Soft, nondistended, nontender. Normal bowel sounds. No appreciable masses or hepatomegaly. Rectal:  Not performed.  Msk:  Symmetrical without gross deformities.  Extremities:  Without edema. Neurologic:  Alert and  oriented x4;  grossly normal neurologically. Skin:  Intact without significant lesions or rashes. Psych:  Alert and cooperative. Normal affect.  LAB RESULTS:  Recent Labs  02/17/15 2205 02/18/15 0525  WBC 8.9 9.0  HGB 12.3 11.3*  HCT 38.0 36.2  PLT 226 228   BMET  Recent Labs  02/17/15 2205 02/18/15 0525  NA 142 142  K 3.5 3.4*  CL 113* 112*  CO2 22 22  GLUCOSE 94 95  BUN 9 7  CREATININE 0.80 0.79  CALCIUM 8.1* 7.8*   LFT  Recent Labs  02/17/15 2205  PROT 7.0  ALBUMIN 3.9  AST 30  ALT 32  ALKPHOS 88  BILITOT 1.0   PT/INR  Recent Labs  02/18/15 0525  LABPROT 13.6  INR 1.02    STUDIES:  Ct Abdomen Pelvis W Contrast  02/18/2015   CLINICAL DATA:  Lung nodule. Intermittent right lower quadrant pain with nausea vomiting for 2 weeks.  EXAM: CT CHEST, ABDOMEN, AND PELVIS WITH CONTRAST  TECHNIQUE: Multidetector CT imaging of the chest, abdomen and pelvis was performed following the standard protocol during bolus administration of intravenous contrast.  CONTRAST:  142mL OMNIPAQUE IOHEXOL 300 MG/ML  SOLN  COMPARISON:  Chest CT  11/02/2014.  FINDINGS: CT CHEST FINDINGS  Heart is normal size. Aorta is normal caliber. Left circumflex artery stents noted. No mediastinal, hilar, or axillary adenopathy. Chest wall soft tissues are unremarkable.  Lungs are clear. No focal airspace opacities or suspicious nodules. No effusions.  CT ABDOMEN AND PELVIS FINDINGS  Liver, gallbladder, spleen, pancreas, adrenals are unremarkable. Small cysts in the right kidney which appear benign. Left kidney unremarkable. No hydronephrosis.  Appendix is visualized and is normal. There is right scratch head there are scattered diverticula in the right colon and sigmoid colon. No active diverticulitis. Stomach and small bowel are unremarkable.  Aorta is normal caliber. Uterus, adnexae and urinary bladder are unremarkable. No free fluid, free air or adenopathy.  Mild rightward thoracic scoliosis.  No acute bony abnormality.  IMPRESSION: No acute findings or significant abnormality in the chest, abdomen or pelvis.  Right renal cysts.  Scattered colonic diverticulosis.   Electronically Signed   By: Rolm Baptise M.D.   On: 02/18/2015 09:56     PREVIOUS ENDOSCOPIES:            No colonoscopy in 10 year or more.    Impression / Plan:   19. 59 year old black female with two month history of intermittent nausea and vomiting. Rule out NSAID induced gastropathy / PUD. No associated abdominal pian or significant weight loss which is reassuring. CTscan, labs unremarkable. For further evaluation of nausea / vomiting patient will be scheduled for EGD. Probably can't do much in the way of biopsies given Brillinta. The benefits, risks, and potential complications of EGD with possible biopsies  were discussed with the patient and she agrees to proceed.   2.Colon cancer screening. This can be done outpatient.   3. CAD / MI in May, s/p stent placement. On Brillinta.   4. Thyroidectomy for goiter. On replacement therapy.    Thanks  Tye Savoy  02/18/2015, 2:01  PM  Attending Addendum: I have taken an interval history, reviewed the chart, and examined the patient. I agree with the Advanced Practitioner's note and impression. 59 y/o female with 2 months of progressive nausea / vomiting. Labs unremarkable. CT scan without obstructive changes. No recent medication changes that would be related. Agree with EGD as the next step in her evaluation. Discussed risks / benefits of EGD with patient and her family. They agree to proceed. Safe to proceed while on Brilinta and can biopsy safely in this setting.   [place], MD [facility] Pager [phone]

## 2015-02-19 NOTE — Progress Notes (Signed)
[facility] PROGRESS NOTE  Brittney Bowen UXL:[id number] DOB: [birth year] DOA: 02/17/2015 PCP: Elyn Peers, MD  Assessment/Plan: 1-Nausea vomiting:  CT abdomen negative.  IV fluids, IV zofran PRN.  GI consulted. S/P  Endoscopy 9-15 Endoscopy unrevealing. Has duodenal lipoma. Needs further follow up.  Plan for Gastric empty study tomorrow.  Biopsy for H pylori pending.   2-Right lower quadrant pain. CT abdomen negative.   3-CAD, S/P OM1 DES 11/04/14 Oral medications resume.  Continue with brilinta.   4-thyroid diseases; TSH . Continue with synthroid./   5-Pyuria: follow urine culture. Continue with ceftriaxone.      Code Status: full code Family Communication: care discussed with husband Disposition Plan: remain inpatient.    Consultants:  GI, [place]  Procedures:  none  Antibiotics:  none  HPI/Subjective: Feeling better wants diet advance.   Objective: Filed Vitals:   02/19/15 1400  BP: 115/46  Pulse: 58  Temp: 98 F (36.7 C)  Resp: 20    Intake/Output Summary (Last 24 hours) at 02/19/15 1653 Last data filed at 02/19/15 1345  Gross per 24 hour  Intake 3670.29 ml  Output      4 ml  Net 3666.29 ml   Filed Weights   02/17/15 2033 02/18/15 0152 02/19/15 0752  Weight: 90.719 kg (200 lb) 91.128 kg (200 lb 14.4 oz) 90.719 kg (200 lb)    Exam:   General:  Alert in no distress  Cardiovascular: S 1, S 2 RRR  Respiratory: CTA  Abdomen:BS present, soft, nt  Musculoskeletal: no edema  Data Reviewed: Basic Metabolic Panel:  Recent Labs Lab 02/17/15 2205 02/18/15 0525 02/19/15 0555  NA 142 142 145  K 3.5 3.4* 3.9  CL 113* 112* 113*  CO2 22 22 26   GLUCOSE 94 95 98  BUN 9 7 <5*  CREATININE 0.80 0.79 0.88  CALCIUM 8.1* 7.8* 8.1*   Liver Function Tests:  Recent Labs Lab 02/17/15 2205  AST 30  ALT 32  ALKPHOS 88  BILITOT 1.0  PROT 7.0  ALBUMIN 3.9    Recent Labs Lab 02/17/15 2205  LIPASE 27   No results for  input(s): AMMONIA in the last 168 hours. CBC:  Recent Labs Lab 02/17/15 2205 02/18/15 0525 02/19/15 0555  WBC 8.9 9.0 6.8  NEUTROABS 5.1  --   --   HGB 12.3 11.3* 11.2*  HCT 38.0 36.2 35.6*  MCV 95.7 96.8 96.5  PLT 226 228 192   Cardiac Enzymes:  Recent Labs Lab 02/17/15 2205  TROPONINI <0.03   BNP (last 3 results) No results for input(s): BNP in the last 8760 hours.  ProBNP (last 3 results) No results for input(s): PROBNP in the last 8760 hours.  CBG: No results for input(s): GLUCAP in the last 168 hours.  Recent Results (from the past 240 hour(s))  Urine culture     Status: None   Collection Time: 02/17/15  9:29 PM  Result Value Ref Range Status   Specimen Description URINE, CLEAN CATCH  Final   Special Requests NONE  Final   Culture   Final    MULTIPLE SPECIES PRESENT, SUGGEST RECOLLECTION Performed at [facility]    Report Status 02/19/2015 FINAL  Final     Studies: Ct Chest W Contrast  02/18/2015   CLINICAL DATA:  Lung nodule. Intermittent right lower quadrant pain with nausea vomiting for 2 weeks.  EXAM: CT CHEST, ABDOMEN, AND PELVIS WITH CONTRAST  TECHNIQUE: Multidetector CT imaging of the chest, abdomen and pelvis was performed  following the standard protocol during bolus administration of intravenous contrast.  CONTRAST:  154mL OMNIPAQUE IOHEXOL 300 MG/ML  SOLN  COMPARISON:  Chest CT 11/02/2014.  FINDINGS: CT CHEST FINDINGS  Heart is normal size. Aorta is normal caliber. Left circumflex artery stents noted. No mediastinal, hilar, or axillary adenopathy. Chest wall soft tissues are unremarkable.  Lungs are clear. No focal airspace opacities or suspicious nodules. No effusions.  CT ABDOMEN AND PELVIS FINDINGS  Liver, gallbladder, spleen, pancreas, adrenals are unremarkable. Small cysts in the right kidney which appear benign. Left kidney unremarkable. No hydronephrosis.  Appendix is visualized and is normal. There is right scratch head there are scattered  diverticula in the right colon and sigmoid colon. No active diverticulitis. Stomach and small bowel are unremarkable.  Aorta is normal caliber. Uterus, adnexae and urinary bladder are unremarkable. No free fluid, free air or adenopathy.  Mild rightward thoracic scoliosis.  No acute bony abnormality.  IMPRESSION: No acute findings or significant abnormality in the chest, abdomen or pelvis.  Right renal cysts.  Scattered colonic diverticulosis.   Electronically Signed   By: Rolm Baptise M.D.   On: 02/18/2015 09:56   Ct Abdomen Pelvis W Contrast  02/18/2015   CLINICAL DATA:  Lung nodule. Intermittent right lower quadrant pain with nausea vomiting for 2 weeks.  EXAM: CT CHEST, ABDOMEN, AND PELVIS WITH CONTRAST  TECHNIQUE: Multidetector CT imaging of the chest, abdomen and pelvis was performed following the standard protocol during bolus administration of intravenous contrast.  CONTRAST:  166mL OMNIPAQUE IOHEXOL 300 MG/ML  SOLN  COMPARISON:  Chest CT 11/02/2014.  FINDINGS: CT CHEST FINDINGS  Heart is normal size. Aorta is normal caliber. Left circumflex artery stents noted. No mediastinal, hilar, or axillary adenopathy. Chest wall soft tissues are unremarkable.  Lungs are clear. No focal airspace opacities or suspicious nodules. No effusions.  CT ABDOMEN AND PELVIS FINDINGS  Liver, gallbladder, spleen, pancreas, adrenals are unremarkable. Small cysts in the right kidney which appear benign. Left kidney unremarkable. No hydronephrosis.  Appendix is visualized and is normal. There is right scratch head there are scattered diverticula in the right colon and sigmoid colon. No active diverticulitis. Stomach and small bowel are unremarkable.  Aorta is normal caliber. Uterus, adnexae and urinary bladder are unremarkable. No free fluid, free air or adenopathy.  Mild rightward thoracic scoliosis.  No acute bony abnormality.  IMPRESSION: No acute findings or significant abnormality in the chest, abdomen or pelvis.  Right renal  cysts.  Scattered colonic diverticulosis.   Electronically Signed   By: Rolm Baptise M.D.   On: 02/18/2015 09:56   Dg Chest Port 1 View  02/17/2015   CLINICAL DATA:  Nausea, heartburn and chest pain for 2 weeks.  EXAM: PORTABLE CHEST - 1 VIEW  COMPARISON:  Radiographs 01/31/2015.  CT 11/02/2014  FINDINGS: The cardiomediastinal contours are normal. The previously questioned nodular density projecting over the right upper lung on prior exam is not seen on the current exam. Pulmonary vasculature is normal. No consolidation, pleural effusion, or pneumothorax. Unchanged mild scoliotic curvature of the spine. No acute osseous abnormalities are seen.  IMPRESSION: No acute pulmonary process. The previously questioned nodular opacity projecting over the right upper lung is not seen on the current exam.   Electronically Signed   By: Jeb Levering M.D.   On: 02/17/2015 21:47    Scheduled Meds: . aspirin  81 mg Oral Daily  . atorvastatin  80 mg Oral q1800  . cefTRIAXone (ROCEPHIN)  IV  1 g Intravenous Q24H  . enoxaparin (LOVENOX) injection  40 mg Subcutaneous Daily  . levothyroxine  100 mcg Oral QAC breakfast  . metoprolol tartrate  12.5 mg Oral Daily  . pantoprazole (PROTONIX) IV  40 mg Intravenous Q24H  . ticagrelor  90 mg Oral BID   Continuous Infusions: . sodium chloride 100 mL/hr at 02/19/15 1153    Principal Problem:   Intractable nausea and vomiting Active Problems:   CAD S/P OM1 DES 11/04/14   Dyslipidemia   Smoker   Lung nodule   RLQ abdominal pain   Thyroid disease   Nausea with vomiting   Protein-calorie malnutrition, severe    Time spent: 35 minutes.     Niel Hummer A  [facility] Pager [phone]. If 7PM-7AM, please contact night-coverage at [email or web address], password [facility] 02/19/2015, 4:53 PM  LOS: 1 day

## 2015-02-19 NOTE — Transfer of Care (Signed)
Immediate Anesthesia Transfer of Care Note  Patient: Brittney Bowen  Procedure(s) Performed: Procedure(s): ESOPHAGOGASTRODUODENOSCOPY (EGD) WITH PROPOFOL (N/A)  Patient Location: Endo recovery  Anesthesia Type:MAC  Level of Consciousness: Patient easily awoken, sedated, comfortable, cooperative, following commands, responds to stimulation.   Airway & Oxygen Therapy: Patient spontaneously breathing, ventilating well, oxygen via simple oxygen mask.  Post-op Assessment: Report given to PACU RN, vital signs reviewed and stable, moving all extremities.   Post vital signs: Reviewed and stable.  Complications: No apparent anesthesia complications

## 2015-02-19 NOTE — Anesthesia Postprocedure Evaluation (Signed)
  Anesthesia Post-op Note  Patient: Brittney Bowen  Procedure(s) Performed: Procedure(s) (LRB): ESOPHAGOGASTRODUODENOSCOPY (EGD) WITH PROPOFOL (N/A)  Patient Location: PACU  Anesthesia Type: MAC  Level of Consciousness: awake and alert   Airway and Oxygen Therapy: Patient Spontanous Breathing  Post-op Pain: mild  Post-op Assessment: Post-op Vital signs reviewed, Patient's Cardiovascular Status Stable, Respiratory Function Stable, Patent Airway and No signs of Nausea or vomiting  Last Vitals:  Filed Vitals:   02/19/15 0955  BP: 129/63  Pulse: 50  Temp: 36.6 C  Resp: 18    Post-op Vital Signs: stable   Complications: No apparent anesthesia complications

## 2015-02-19 NOTE — Op Note (Signed)
[facility] [place], [address]   ENDOSCOPY PROCEDURE REPORT  PATIENT: Brittney Bowen, Brittney Bowen  MR#: [id number] BIRTHDATE: 10-16-1955 , 59  yrs. old GENDER: female ENDOSCOPIST: Friant Cellar, MD REFERRED BY: PROCEDURE DATE:  02/19/2015 PROCEDURE:  EGD w/ biopsy ASA CLASS:     Class III INDICATIONS:  vomiting. MEDICATIONS: Per Anesthesia TOPICAL ANESTHETIC:  DESCRIPTION OF PROCEDURE: After the risks benefits and alternatives of the procedure were thoroughly explained, informed consent was obtained.  The Pentax Gastroscope [id number] endoscope was introduced through the mouth and advanced to the second portion of the duodenum , Without limitations.  The instrument was slowly withdrawn as the mucosa was fully examined.    FINDINGS: The esophagus was normal.  DH, GEJ, and SCJ located 40cm from the incisors.  The stomach was normal without mucosal abnormalities.  Retroflexed views were normal.  Biopsies taken to rule out H pylori.  The duodenal bulb was normal.  There were 3 subepithelial nodules in the 2nd portion of the duodenum, soft, most consistent with lipomas.  The ampulla was visualized separately with bile and appeared normal.  Biopsies were taken of the nodule.  No evidence of obstructive pathology was noted otherwise.  Retroflexed views revealed no abnormalities.     The scope was then withdrawn from the patient and the procedure completed.  COMPLICATIONS: There were no immediate complications.  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  Overall, no evidence of obstructive pathology to account for the patient's symptoms.  RECOMMENDATIONS: Return to medical ward Resume medication Resume diet, as tolerated No narcotics Recommend gastric empying study once off all narcotics to assess for gastroparesis, as  imaging and labs are otherwise negative. Trial of promotility agents after gastric emptying study Scheduled zofran every 8 hours GI service will continue to follow   eSigned:  [place] Cellar, MD 02/19/2015 8:50 AM    CC:  PATIENT NAME:  Santiago, Graf MR#: [id number]

## 2015-02-19 NOTE — Interval H&P Note (Signed)
History and Physical Interval Note:  7/41/6384 5:36 AM  Brittney Bowen  has presented today for surgery, with the diagnosis of nausea and vomiting  The various methods of treatment have been discussed with the patient and family. After consideration of risks, benefits and other options for treatment, the patient has consented to  Procedure(s): ESOPHAGOGASTRODUODENOSCOPY (EGD) WITH PROPOFOL (N/A) as a surgical intervention .  The patient's history has been reviewed, patient examined, no change in status, stable for surgery.  I have reviewed the patient's chart and labs.  Questions were answered to the patient's satisfaction.     Brittney Bowen

## 2015-02-19 NOTE — Anesthesia Preprocedure Evaluation (Addendum)
Anesthesia Evaluation  Patient identified by MRN, date of birth, ID band Patient awake    Reviewed: Allergy & Precautions, NPO status , Patient's Chart, lab work & pertinent test results  History of Anesthesia Complications Negative for: history of anesthetic complications  Airway Mallampati: II  TM Distance: >3 FB Neck ROM: Full    Dental no notable dental hx. (+) Chipped, Poor Dentition   Pulmonary Current Smoker,    Pulmonary exam normal breath sounds clear to auscultation       Cardiovascular + CAD, + Past MI and + Cardiac Stents  Normal cardiovascular exam Rhythm:Regular Rate:Normal  admitted 5/29-6/1 with NSTEMI. She went to the cath lab 11/04/14 and this revealed high grade OM1 disease and underwent DES placement. EF was normal on echo with grade 1 diastolic dysfunction. Post PCI course was uneventful   Neuro/Psych negative neurological ROS  negative psych ROS   GI/Hepatic negative GI ROS, Neg liver ROS,   Endo/Other  Hypothyroidism   Renal/GU negative Renal ROS  negative genitourinary   Musculoskeletal negative musculoskeletal ROS (+)   Abdominal   Peds negative pediatric ROS (+)  Hematology negative hematology ROS (+)   Anesthesia Other Findings   Reproductive/Obstetrics negative OB ROS                            Anesthesia Physical Anesthesia Plan  ASA: III and emergent  Anesthesia Plan: MAC   Post-op Pain Management:    Induction: Intravenous  Airway Management Planned:   Additional Equipment:   Intra-op Plan:   Post-operative Plan:   Informed Consent: I have reviewed the patients History and Physical, chart, labs and discussed the procedure including the risks, benefits and alternatives for the proposed anesthesia with the patient or authorized representative who has indicated his/her understanding and acceptance.   Dental advisory given  Plan Discussed with:  CRNA  Anesthesia Plan Comments: (Brittney Bowen is s/p DES 11/04/14. Her post MI course has been uncomplicated and she has been completing cardiac rehab. She has been continued on dual antiplatelet therapy. She presented to the ED with N/V and is requiring an EGD. Given her symptoms and hospitalization, it is my understanding that this is not an elective procedure and must proceed under the recommend 1 year time frame from placement of her DES. Her antiplatelet therapy has been continued. I discussed these risks with the patient and she expressed an understanding. )       Anesthesia Quick Evaluation

## 2015-02-20 ENCOUNTER — Encounter: Payer: Self-pay | Admitting: *Deleted

## 2015-02-20 ENCOUNTER — Encounter (HOSPITAL_COMMUNITY): Payer: Self-pay | Admitting: Gastroenterology

## 2015-02-20 ENCOUNTER — Inpatient Hospital Stay (HOSPITAL_COMMUNITY): Payer: Medicaid Other

## 2015-02-20 ENCOUNTER — Telehealth: Payer: Self-pay | Admitting: *Deleted

## 2015-02-20 ENCOUNTER — Encounter (HOSPITAL_COMMUNITY): Payer: Medicaid Other

## 2015-02-20 DIAGNOSIS — R112 Nausea with vomiting, unspecified: Principal | ICD-10-CM

## 2015-02-20 DIAGNOSIS — G43A1 Cyclical vomiting, intractable: Secondary | ICD-10-CM

## 2015-02-20 MED ORDER — ONDANSETRON 8 MG PO TBDP
8.0000 mg | ORAL_TABLET | Freq: Three times a day (TID) | ORAL | Status: DC
  Administered 2015-02-20 – 2015-02-21 (×4): 8 mg via ORAL
  Filled 2015-02-20: qty 1
  Filled 2015-02-20: qty 2
  Filled 2015-02-20 (×2): qty 1
  Filled 2015-02-20: qty 2
  Filled 2015-02-20: qty 1
  Filled 2015-02-20: qty 2
  Filled 2015-02-20 (×3): qty 1

## 2015-02-20 MED ORDER — PROMETHAZINE HCL 25 MG/ML IJ SOLN
6.2500 mg | Freq: Four times a day (QID) | INTRAMUSCULAR | Status: DC | PRN
  Administered 2015-02-20: 6.25 mg via INTRAVENOUS
  Filled 2015-02-20: qty 1

## 2015-02-20 NOTE — Telephone Encounter (Signed)
Patient scheduled on 03/17/15 at 9:45 AM. Letter mailed to patient.

## 2015-02-20 NOTE — Progress Notes (Signed)
Progress Note   Subjective  Patient reports still feeling nauseated but overall improved, less vomiting. EGD yesterday for the most part unremarkable.    Objective   Vital signs in last 24 hours: Temp:  [97.9 F (36.6 C)-98.9 F (37.2 C)] 98.2 F (36.8 C) (09/16 0647) Pulse Rate:  [50-63] 57 (09/16 0647) Resp:  [12-22] 20 (09/16 0647) BP: (115-151)/(46-73) 122/71 mmHg (09/16 0647) SpO2:  [94 %-98 %] 97 % (09/16 0647) Weight:  [200 lb (90.719 kg)] 200 lb (90.719 kg) (09/15 0752) Last BM Date: 02/18/15 General:    AA female in NAD Heart:  Regular rate and rhythm; no murmurs Lungs: Respirations even and unlabored, lungs CTA bilaterally Abdomen:  Soft, nontender and nondistended. Normal bowel sounds. Extremities:  Without edema. Neurologic:  Alert and oriented,  grossly normal neurologically. Psych:  Cooperative. Normal mood and affect.  Intake/Output from previous day: 09/15 0701 - 09/16 0700 In: 3090 [P.O.:240; I.V.:2800; IV Piggyback:50] Out: 5 [Urine:4; Stool:1] Intake/Output this shift:    Lab Results:  Recent Labs  02/17/15 2205 02/18/15 0525 02/19/15 0555  WBC 8.9 9.0 6.8  HGB 12.3 11.3* 11.2*  HCT 38.0 36.2 35.6*  PLT 226 228 192   BMET  Recent Labs  02/17/15 2205 02/18/15 0525 02/19/15 0555  NA 142 142 145  K 3.5 3.4* 3.9  CL 113* 112* 113*  CO2 22 22 26   GLUCOSE 94 95 98  BUN 9 7 <5*  CREATININE 0.80 0.79 0.88  CALCIUM 8.1* 7.8* 8.1*   LFT  Recent Labs  02/17/15 2205  PROT 7.0  ALBUMIN 3.9  AST 30  ALT 32  ALKPHOS 88  BILITOT 1.0   PT/INR  Recent Labs  02/18/15 0525  LABPROT 13.6  INR 1.02    Studies/Results: Ct Chest W Contrast  02/18/2015   CLINICAL DATA:  Lung nodule. Intermittent right lower quadrant pain with nausea vomiting for 2 weeks.  EXAM: CT CHEST, ABDOMEN, AND PELVIS WITH CONTRAST  TECHNIQUE: Multidetector CT imaging of the chest, abdomen and pelvis was performed following the standard protocol during bolus  administration of intravenous contrast.  CONTRAST:  143mL OMNIPAQUE IOHEXOL 300 MG/ML  SOLN  COMPARISON:  Chest CT 11/02/2014.  FINDINGS: CT CHEST FINDINGS  Heart is normal size. Aorta is normal caliber. Left circumflex artery stents noted. No mediastinal, hilar, or axillary adenopathy. Chest wall soft tissues are unremarkable.  Lungs are clear. No focal airspace opacities or suspicious nodules. No effusions.  CT ABDOMEN AND PELVIS FINDINGS  Liver, gallbladder, spleen, pancreas, adrenals are unremarkable. Small cysts in the right kidney which appear benign. Left kidney unremarkable. No hydronephrosis.  Appendix is visualized and is normal. There is right scratch head there are scattered diverticula in the right colon and sigmoid colon. No active diverticulitis. Stomach and small bowel are unremarkable.  Aorta is normal caliber. Uterus, adnexae and urinary bladder are unremarkable. No free fluid, free air or adenopathy.  Mild rightward thoracic scoliosis.  No acute bony abnormality.  IMPRESSION: No acute findings or significant abnormality in the chest, abdomen or pelvis.  Right renal cysts.  Scattered colonic diverticulosis.   Electronically Signed   By: Rolm Baptise M.D.   On: 02/18/2015 09:56   Ct Abdomen Pelvis W Contrast  02/18/2015   CLINICAL DATA:  Lung nodule. Intermittent right lower quadrant pain with nausea vomiting for 2 weeks.  EXAM: CT CHEST, ABDOMEN, AND PELVIS WITH CONTRAST  TECHNIQUE: Multidetector CT imaging of the chest, abdomen and pelvis was  performed following the standard protocol during bolus administration of intravenous contrast.  CONTRAST:  158mL OMNIPAQUE IOHEXOL 300 MG/ML  SOLN  COMPARISON:  Chest CT 11/02/2014.  FINDINGS: CT CHEST FINDINGS  Heart is normal size. Aorta is normal caliber. Left circumflex artery stents noted. No mediastinal, hilar, or axillary adenopathy. Chest wall soft tissues are unremarkable.  Lungs are clear. No focal airspace opacities or suspicious nodules. No  effusions.  CT ABDOMEN AND PELVIS FINDINGS  Liver, gallbladder, spleen, pancreas, adrenals are unremarkable. Small cysts in the right kidney which appear benign. Left kidney unremarkable. No hydronephrosis.  Appendix is visualized and is normal. There is right scratch head there are scattered diverticula in the right colon and sigmoid colon. No active diverticulitis. Stomach and small bowel are unremarkable.  Aorta is normal caliber. Uterus, adnexae and urinary bladder are unremarkable. No free fluid, free air or adenopathy.  Mild rightward thoracic scoliosis.  No acute bony abnormality.  IMPRESSION: No acute findings or significant abnormality in the chest, abdomen or pelvis.  Right renal cysts.  Scattered colonic diverticulosis.   Electronically Signed   By: Rolm Baptise M.D.   On: 02/18/2015 09:56       Assessment / Plan:   59 y/o female with 2 months of progressive nausea / vomiting. Labs unremarkable. CT scan without obstructive changes. No recent medication changes that would be related. EGD relatively unremarkable other than suspected duodenal lipoma. Biopsies taken to rule out H pylori. At this time recommend scheduled zofran every 8 hours to prevent nausea. Plan for gastric emptying study today. She is stable otherwise and I think can go home after this study with scheduled zofran. If her gastric emptying study is positive we will treat for gastroparesis. If negative, will further workup as an outpatient if she is discharged today, given no significant pathology found thus far and symptoms while bothersome have been chronic and not leading to dehydration, etc. Discussed with patient and husband who agreed, all questions answered.   [place]usk Cellar, MD [facility] Pager [phone]   Principal Problem:   Intractable nausea and vomiting Active Problems:   CAD S/P OM1 DES 11/04/14   Dyslipidemia   Smoker   Lung nodule   RLQ abdominal pain   Thyroid disease   Nausea with  vomiting   Protein-calorie malnutrition, severe     LOS: 2 days   Brittney Bowen  02/20/2015, 7:47 AM

## 2015-02-20 NOTE — Progress Notes (Signed)
[facility] PROGRESS NOTE  Brittney Bowen YQM:[id number] DOB: 1955-08-23 DOA: 02/17/2015 PCP: Elyn Peers, MD  Assessment/Plan: 1-Nausea vomiting:  CT abdomen negative.  IV fluids, IV zofran PRN.  GI consulted. S/P  Endoscopy 9-15 Endoscopy unrevealing. Has duodenal lipoma.  Unable to tolerates gastric empty study.  Biopsy for H pylori pending.  Schedule Zofran.   2-Right lower quadrant pain. CT abdomen negative.   3-CAD, S/P OM1 DES 11/04/14 Oral medications resume.  Continue with brilinta.   4-Thyroid diseases; TSH . Continue with synthroid./   5-Pyuria: follow urine culture. Continue with ceftriaxone day 2     Code Status: full code Family Communication: care discussed with husband Disposition Plan: remain inpatient.    Consultants:  GI, [place]  Procedures:  none  Antibiotics:  none  HPI/Subjective: She was not able to tolerates Gastric empty study, develops vomit after swallowing eg. Feels nauseous, feels egg is stock in her chest.    Objective: Filed Vitals:   02/20/15 1001  BP: 127/59  Pulse: 51  Temp: 98.1 F (36.7 C)  Resp: 20    Intake/Output Summary (Last 24 hours) at 02/20/15 1231 Last data filed at 02/20/15 1044  Gross per 24 hour  Intake   2690 ml  Output      6 ml  Net   2684 ml   Filed Weights   02/17/15 2033 02/18/15 0152 02/19/15 0752  Weight: 90.719 kg (200 lb) 91.128 kg (200 lb 14.4 oz) 90.719 kg (200 lb)    Exam:   General:  Alert in no distress  Cardiovascular: S 1, S 2 RRR  Respiratory: CTA  Abdomen:BS present, soft, nt  Musculoskeletal: no edema  Data Reviewed: Basic Metabolic Panel:  Recent Labs Lab 02/17/15 2205 02/18/15 0525 02/19/15 0555  NA 142 142 145  K 3.5 3.4* 3.9  CL 113* 112* 113*  CO2 22 22 26   GLUCOSE 94 95 98  BUN 9 7 <5*  CREATININE 0.80 0.79 0.88  CALCIUM 8.1* 7.8* 8.1*   Liver Function Tests:  Recent Labs Lab 02/17/15 2205  AST 30  ALT 32  ALKPHOS 88  BILITOT  1.0  PROT 7.0  ALBUMIN 3.9    Recent Labs Lab 02/17/15 2205  LIPASE 27   No results for input(s): AMMONIA in the last 168 hours. CBC:  Recent Labs Lab 02/17/15 2205 02/18/15 0525 02/19/15 0555  WBC 8.9 9.0 6.8  NEUTROABS 5.1  --   --   HGB 12.3 11.3* 11.2*  HCT 38.0 36.2 35.6*  MCV 95.7 96.8 96.5  PLT 226 228 192   Cardiac Enzymes:  Recent Labs Lab 02/17/15 2205  TROPONINI <0.03   BNP (last 3 results) No results for input(s): BNP in the last 8760 hours.  ProBNP (last 3 results) No results for input(s): PROBNP in the last 8760 hours.  CBG: No results for input(s): GLUCAP in the last 168 hours.  Recent Results (from the past 240 hour(s))  Urine culture     Status: None   Collection Time: 02/17/15  9:29 PM  Result Value Ref Range Status   Specimen Description URINE, CLEAN CATCH  Final   Special Requests NONE  Final   Culture   Final    MULTIPLE SPECIES PRESENT, SUGGEST RECOLLECTION Performed at [facility]    Report Status 02/19/2015 FINAL  Final     Studies: No results found.  Scheduled Meds: . aspirin  81 mg Oral Daily  . atorvastatin  80 mg Oral q1800  .  cefTRIAXone (ROCEPHIN)  IV  1 g Intravenous Q24H  . enoxaparin (LOVENOX) injection  40 mg Subcutaneous Daily  . levothyroxine  100 mcg Oral QAC breakfast  . metoprolol tartrate  12.5 mg Oral Daily  . ondansetron  8 mg Oral TID  . pantoprazole (PROTONIX) IV  40 mg Intravenous Q24H  . ticagrelor  90 mg Oral BID   Continuous Infusions: . sodium chloride 100 mL/hr at 02/20/15 0820    Principal Problem:   Intractable nausea and vomiting Active Problems:   CAD S/P OM1 DES 11/04/14   Dyslipidemia   Smoker   Lung nodule   RLQ abdominal pain   Thyroid disease   Nausea with vomiting   Protein-calorie malnutrition, severe    Time spent: 35 minutes.     Niel Hummer A  [facility] Pager [phone]. If 7PM-7AM, please contact night-coverage at [email or web address], password  [facility] 02/20/2015, 12:31 PM  LOS: 2 days

## 2015-02-20 NOTE — Telephone Encounter (Signed)
-----   Message from Manus Gunning, MD sent at 02/20/2015  7:52 AM EDT ----- Carola Frost,  This patient is hopefully discharged later today. Can you book her to see me in a couple weeks if I have anything open? Thanks

## 2015-02-21 MED ORDER — PANTOPRAZOLE SODIUM 40 MG PO TBEC: 40.0000 mg | DELAYED_RELEASE_TABLET | Freq: Every day | ORAL | Status: DC

## 2015-02-21 MED ORDER — ONDANSETRON 8 MG PO TBDP: 8.0000 mg | ORAL_TABLET | Freq: Three times a day (TID) | ORAL | Status: DC

## 2015-02-21 NOTE — Progress Notes (Signed)
Went over d/c instructions with patient.  She verbalized understanding regarding d/c and follow up appointment to schedule GI emptying study with GI doctor.  Patient left via w/c and personal vehicle with hard scripts and personal belongings. Virginia Rochester, RN

## 2015-02-21 NOTE — Progress Notes (Signed)
Key Points: Use following P&T approved IV to PO non-antibiotic change policy.  Description contains the criteria that are approved Note: Policy Excludes:  Esophagectomy patientsPHARMACIST - PHYSICIAN COMMUNICATION CONCERNING: IV to Oral Route Change Policy  RECOMMENDATION: This patient is receiving Protonix by the intravenous route.  Based on criteria approved by the Pharmacy and Therapeutics Committee, the intravenous medication(s) is/are being converted to the equivalent oral dose form(s).   DESCRIPTION: These criteria include:  The patient is eating (either orally or via tube) and/or has been taking other orally administered medications for a least 24 hours  The patient has no evidence of active gastrointestinal bleeding or impaired GI absorption (gastrectomy, short bowel, patient on TNA or NPO).  If you have questions about this conversion, please contact the Pharmacy Department  []   [phone] )  [facility] []   [phone] )  [facility]  []   [phone] )  [facility] [x]   [phone] )  [place], [place], [facility] 02/21/2015 11:55 AM

## 2015-02-21 NOTE — Discharge Instructions (Signed)
Schedule Gastric Empty study , use oatmeal, no eggs.

## 2015-02-21 NOTE — Discharge Summary (Signed)
Physician Discharge Summary  Brittney Bowen [id number]:[id number] DOB: 20-Nov-1955 DOA: 02/17/2015  PCP: Elyn Peers, MD  Admit date: 02/17/2015 Discharge date: 02/21/2015  Time spent: 35 minutes  Recommendations for Outpatient Follow-up:  Needs to follow up with GI for gastric empty study and further work up   Discharge Diagnoses:    Intractable nausea and vomiting   CAD S/P OM1 DES 11/04/14   Dyslipidemia   Smoker   Lung nodule   RLQ abdominal pain   Thyroid disease   Nausea with vomiting   Protein-calorie malnutrition, severe   Discharge Condition: Stable  Diet recommendation: [place] diet  Filed Weights   02/17/15 2033 02/18/15 0152 02/19/15 0752  Weight: 90.719 kg (200 lb) 91.128 kg (200 lb 14.4 oz) 90.719 kg (200 lb)    History of present illness:  Brittney Bowen is a 59 y.o. female with a history of CAD S/P PCI with DES Stent 10/2014 who presents to the ED with complaints of intractable Nausea and vomiting for the past 4 weeks worse over the past 3-4 days. She also reports haviing RLQ ABD Pain. She has not been able to hold down foods and liquids or her medications. She denies any hematemesis, fevers or chills or diarrhea. She last saw her cardiologist on 02/12/2015 and was referred to see Dr Deatra Ina GI for evaluation. She contacted her cardiologist regarding her symptoms and reported that she has not been able to hold down her medicationss including the Brillinta Rx over the past 3 days, so she was advised to got to the ED.   Hospital Course:  1-Nausea vomiting: Unclear etiology. To complete work up out patient.  CT abdomen negative.  IV fluids, IV zofran PRN.  GI consulted. S/P Endoscopy 9-15 . Endoscopy unrevealing. Has duodenal lipoma.  Unable to tolerates gastric empty study. Plan to do it outpatient with oatmeal instead of egg.  Biopsy for H pylori pending.  Schedule Zofran. CT head negative Patient has been able to tolerates diet, and able to keep  medications down.   2-Right lower quadrant pain. CT abdomen negative.   3-CAD, S/P OM1 DES 11/04/14 Oral medications resume.  Continue with brilinta.   4-Thyroid diseases; TSH . Continue with synthroid./   5-Pyuria: follow urine culture with multiple bacterial. Received 3 days of ceftriaxone.   Procedures: Endoscopy  Consultations:  GI  Discharge Exam: Filed Vitals:   02/21/15 1000  BP: 121/78  Pulse: 55  Temp: 97.9 F (36.6 C)  Resp: 18    General: NAD Cardiovascular: S 1, S 2 RRR Respiratory: CTA  Discharge Instructions   Discharge Instructions    Diet - low sodium heart healthy    Complete by:  As directed      Increase activity slowly    Complete by:  As directed           Current Discharge Medication List    START taking these medications   Details  ondansetron (ZOFRAN-ODT) 8 MG disintegrating tablet Take 1 tablet (8 mg total) by mouth 3 (three) times daily. Qty: 20 tablet, Refills: 0      CONTINUE these medications which have NOT CHANGED   Details  acetaminophen (TYLENOL) 325 MG tablet Take 2 tablets (650 mg total) by mouth every 4 (four) hours as needed for headache or mild pain.    aspirin 81 MG chewable tablet Chew 1 tablet (81 mg total) by mouth daily.    atorvastatin (LIPITOR) 80 MG tablet Take 1 tablet (80 mg total) by mouth  daily at 6 PM. Qty: 30 tablet, Refills: 11    levothyroxine (SYNTHROID, LEVOTHROID) 100 MCG tablet Take 1 tablet (100 mcg total) by mouth daily before breakfast. Qty: 30 tablet, Refills: 0   Associated Diagnoses: Hypothyroidism, unspecified hypothyroidism type    LORazepam (ATIVAN) 0.5 MG tablet Take 0.5 mg by mouth 3 (three) times daily.    metoprolol tartrate (LOPRESSOR) 25 MG tablet Take 12.5 mg by mouth daily.    nitroGLYCERIN (NITROSTAT) 0.4 MG SL tablet Place 1 tablet (0.4 mg total) under the tongue every 5 (five) minutes as needed for chest pain. Qty: 25 tablet, Refills: 2    ticagrelor (BRILINTA) 90 MG  TABS tablet Take 1 tablet (90 mg total) by mouth 2 (two) times daily. Qty: 60 tablet, Refills: 10      STOP taking these medications     metFORMIN (GLUCOPHAGE) 500 MG tablet        Allergies  Allergen Reactions  . Blue Dyes (Parenteral) Anaphylaxis    Blueberries - anaphylaxis    Follow-up Information    Follow up with Manus Gunning, MD In 1 week.   Specialty:  Gastroenterology   Contact information:   [address] [place] [address] [phone]        The results of significant diagnostics from this hospitalization (including imaging, microbiology, ancillary and laboratory) are listed below for reference.    Significant Diagnostic Studies: Dg Chest 2 View  01/31/2015   CLINICAL DATA:  Nausea and vomiting for 2 weeks, fatigue, substernal chest pressure, history coronary artery disease post MI, smoker  EXAM: CHEST  2 VIEW  COMPARISON:  11/02/2014  FINDINGS: Normal heart size, mediastinal contours, and pulmonary vascularity.  Thoracolumbar scoliosis.  No definite infiltrate, pleural effusion or pneumothorax.  Linear scar in lingula.  Question new nodular density RIGHT upper lobe versus artifact related to the anterior RIGHT second rib, 14 x 6 mm.  IMPRESSION: Thoracolumbar scoliosis.  Question new nodular density versus artifact RIGHT upper lobe 14 x 6 mm; CT chest recommended to exclude pulmonary nodule.   Electronically Signed   By: Lavonia Dana M.D.   On: 01/31/2015 19:57   Ct Head Wo Contrast  02/20/2015   CLINICAL DATA:  Headache and high blood pressure  EXAM: CT HEAD WITHOUT CONTRAST  TECHNIQUE: Contiguous axial images were obtained from the base of the skull through the vertex without intravenous contrast.  COMPARISON:  10/08/2007  FINDINGS: Skull and Sinuses:Negative for fracture or destructive process. The mastoids, middle ears, and imaged paranasal sinuses are clear.  Orbits: No acute abnormality.  Brain: No evidence of acute infarction, hemorrhage,  hydrocephalus, or mass lesion/mass effect.  Notable dural ossification at the vertex without progression since 2009.  No evidence of white matter disease or accelerated atrophy.  IMPRESSION: Negative head CT.   Electronically Signed   By: Monte Fantasia M.D.   On: 02/20/2015 15:58   Ct Chest W Contrast  02/18/2015   CLINICAL DATA:  Lung nodule. Intermittent right lower quadrant pain with nausea vomiting for 2 weeks.  EXAM: CT CHEST, ABDOMEN, AND PELVIS WITH CONTRAST  TECHNIQUE: Multidetector CT imaging of the chest, abdomen and pelvis was performed following the standard protocol during bolus administration of intravenous contrast.  CONTRAST:  133mL OMNIPAQUE IOHEXOL 300 MG/ML  SOLN  COMPARISON:  Chest CT 11/02/2014.  FINDINGS: CT CHEST FINDINGS  Heart is normal size. Aorta is normal caliber. Left circumflex artery stents noted. No mediastinal, hilar, or axillary adenopathy. Chest wall  soft tissues are unremarkable.  Lungs are clear. No focal airspace opacities or suspicious nodules. No effusions.  CT ABDOMEN AND PELVIS FINDINGS  Liver, gallbladder, spleen, pancreas, adrenals are unremarkable. Small cysts in the right kidney which appear benign. Left kidney unremarkable. No hydronephrosis.  Appendix is visualized and is normal. There is right scratch head there are scattered diverticula in the right colon and sigmoid colon. No active diverticulitis. Stomach and small bowel are unremarkable.  Aorta is normal caliber. Uterus, adnexae and urinary bladder are unremarkable. No free fluid, free air or adenopathy.  Mild rightward thoracic scoliosis.  No acute bony abnormality.  IMPRESSION: No acute findings or significant abnormality in the chest, abdomen or pelvis.  Right renal cysts.  Scattered colonic diverticulosis.   Electronically Signed   By: Rolm Baptise M.D.   On: 02/18/2015 09:56   Ct Abdomen Pelvis W Contrast  02/18/2015   CLINICAL DATA:  Lung nodule. Intermittent right lower quadrant pain with nausea  vomiting for 2 weeks.  EXAM: CT CHEST, ABDOMEN, AND PELVIS WITH CONTRAST  TECHNIQUE: Multidetector CT imaging of the chest, abdomen and pelvis was performed following the standard protocol during bolus administration of intravenous contrast.  CONTRAST:  149mL OMNIPAQUE IOHEXOL 300 MG/ML  SOLN  COMPARISON:  Chest CT 11/02/2014.  FINDINGS: CT CHEST FINDINGS  Heart is normal size. Aorta is normal caliber. Left circumflex artery stents noted. No mediastinal, hilar, or axillary adenopathy. Chest wall soft tissues are unremarkable.  Lungs are clear. No focal airspace opacities or suspicious nodules. No effusions.  CT ABDOMEN AND PELVIS FINDINGS  Liver, gallbladder, spleen, pancreas, adrenals are unremarkable. Small cysts in the right kidney which appear benign. Left kidney unremarkable. No hydronephrosis.  Appendix is visualized and is normal. There is right scratch head there are scattered diverticula in the right colon and sigmoid colon. No active diverticulitis. Stomach and small bowel are unremarkable.  Aorta is normal caliber. Uterus, adnexae and urinary bladder are unremarkable. No free fluid, free air or adenopathy.  Mild rightward thoracic scoliosis.  No acute bony abnormality.  IMPRESSION: No acute findings or significant abnormality in the chest, abdomen or pelvis.  Right renal cysts.  Scattered colonic diverticulosis.   Electronically Signed   By: Rolm Baptise M.D.   On: 02/18/2015 09:56   Dg Chest Port 1 View  02/17/2015   CLINICAL DATA:  Nausea, heartburn and chest pain for 2 weeks.  EXAM: PORTABLE CHEST - 1 VIEW  COMPARISON:  Radiographs 01/31/2015.  CT 11/02/2014  FINDINGS: The cardiomediastinal contours are normal. The previously questioned nodular density projecting over the right upper lung on prior exam is not seen on the current exam. Pulmonary vasculature is normal. No consolidation, pleural effusion, or pneumothorax. Unchanged mild scoliotic curvature of the spine. No acute osseous abnormalities  are seen.  IMPRESSION: No acute pulmonary process. The previously questioned nodular opacity projecting over the right upper lung is not seen on the current exam.   Electronically Signed   By: Jeb Levering M.D.   On: 02/17/2015 21:47    Microbiology: Recent Results (from the past 240 hour(s))  Urine culture     Status: None   Collection Time: 02/17/15  9:29 PM  Result Value Ref Range Status   Specimen Description URINE, CLEAN CATCH  Final   Special Requests NONE  Final   Culture   Final    MULTIPLE SPECIES PRESENT, SUGGEST RECOLLECTION Performed at [facility]    Report Status 02/19/2015 FINAL  Final  Labs: Basic Metabolic Panel:  Recent Labs Lab 02/17/15 2205 02/18/15 0525 02/19/15 0555  NA 142 142 145  K 3.5 3.4* 3.9  CL 113* 112* 113*  CO2 22 22 26   GLUCOSE 94 95 98  BUN 9 7 <5*  CREATININE 0.80 0.79 0.88  CALCIUM 8.1* 7.8* 8.1*   Liver Function Tests:  Recent Labs Lab 02/17/15 2205  AST 30  ALT 32  ALKPHOS 88  BILITOT 1.0  PROT 7.0  ALBUMIN 3.9    Recent Labs Lab 02/17/15 2205  LIPASE 27   No results for input(s): AMMONIA in the last 168 hours. CBC:  Recent Labs Lab 02/17/15 2205 02/18/15 0525 02/19/15 0555  WBC 8.9 9.0 6.8  NEUTROABS 5.1  --   --   HGB 12.3 11.3* 11.2*  HCT 38.0 36.2 35.6*  MCV 95.7 96.8 96.5  PLT 226 228 192   Cardiac Enzymes:  Recent Labs Lab 02/17/15 2205  TROPONINI <0.03   BNP: BNP (last 3 results) No results for input(s): BNP in the last 8760 hours.  ProBNP (last 3 results) No results for input(s): PROBNP in the last 8760 hours.  CBG: No results for input(s): GLUCAP in the last 168 hours.     SignedNiel Hummer A  [facility] 02/21/2015, 11:23 AM

## 2015-02-23 ENCOUNTER — Telehealth: Payer: Self-pay | Admitting: Gastroenterology

## 2015-02-23 ENCOUNTER — Encounter (HOSPITAL_COMMUNITY): Admission: RE | Admit: 2015-02-23 | Payer: Medicaid Other | Source: Ambulatory Visit

## 2015-02-23 DIAGNOSIS — R112 Nausea with vomiting, unspecified: Secondary | ICD-10-CM

## 2015-02-23 NOTE — Telephone Encounter (Signed)
Spoke with patient and she was unable to do the GES with an egg. She states she was told to call here to get it scheduled with oatmeal. She has an OV with Dr. Havery Moros on 10/11/6. Please, advise.

## 2015-02-23 NOTE — Telephone Encounter (Signed)
Yes did not tolerate the test using Eggs and would try with oatmeal. If you could order a gastric emptying study with special request for Oatmeal that would be helpful, or if not an option we may need to touch base with radiology about it.

## 2015-02-23 NOTE — Telephone Encounter (Signed)
Scheduled GES on 03/05/15 at 7:30 AM. NPO 6 hours prior and no stomach medications. Patient aware.

## 2015-02-25 ENCOUNTER — Encounter (HOSPITAL_COMMUNITY): Payer: Medicaid Other

## 2015-02-25 ENCOUNTER — Other Ambulatory Visit: Payer: Self-pay | Admitting: *Deleted

## 2015-02-26 ENCOUNTER — Other Ambulatory Visit: Payer: Self-pay | Admitting: *Deleted

## 2015-02-26 MED ORDER — METRONIDAZOLE 500 MG PO TABS: ORAL_TABLET | ORAL | Status: DC

## 2015-02-26 MED ORDER — PANTOPRAZOLE SODIUM 40 MG PO TBEC: DELAYED_RELEASE_TABLET | ORAL | Status: DC

## 2015-02-26 MED ORDER — AMOXICILLIN 500 MG PO TABS: ORAL_TABLET | ORAL | Status: DC

## 2015-02-26 MED ORDER — CLARITHROMYCIN 500 MG PO TABS: ORAL_TABLET | ORAL | Status: DC

## 2015-02-27 ENCOUNTER — Encounter (HOSPITAL_COMMUNITY): Payer: Medicaid Other

## 2015-03-02 ENCOUNTER — Encounter (HOSPITAL_COMMUNITY): Payer: Medicaid Other

## 2015-03-04 ENCOUNTER — Encounter (HOSPITAL_COMMUNITY): Payer: Medicaid Other

## 2015-03-05 ENCOUNTER — Ambulatory Visit (HOSPITAL_COMMUNITY)
Admission: RE | Admit: 2015-03-05 | Discharge: 2015-03-05 | Disposition: A | Discharge status: Home / Self Care | Payer: Medicaid Other | Source: Ambulatory Visit | Attending: Gastroenterology | Admitting: Gastroenterology

## 2015-03-05 DIAGNOSIS — R112 Nausea with vomiting, unspecified: Secondary | ICD-10-CM | POA: Diagnosis present

## 2015-03-05 LAB — GLUCOSE, CAPILLARY: GLUCOSE-CAPILLARY: 113 mg/dL — AB (ref 65–99)

## 2015-03-05 MED ORDER — TECHNETIUM TC 99M SULFUR COLLOID
2.2000 | Freq: Once | INTRAVENOUS | Status: DC | PRN
  Administered 2015-03-05: 2.2 via INTRAVENOUS
  Filled 2015-03-05: qty 2.2

## 2015-03-05 NOTE — Progress Notes (Signed)
Rapid Response Event Note  Overview: 8756  Called to NM to assess patient c/o dizziness, "feeling hot", tremors, and nausea.  Initial Focused Assessment: [phone]  Patient on NM bed, had requested for test to be started regardless of symptoms.  Patient finished imaging (<2 minutes).  Patient appeared pale and shaking.  Patient "feels hot", is not diaphoretic.  Complains of nausea.  Patient has not been able to eat much or keep foods down for weeks, and had to eat oatmeal prior to testing today.  Patient did not take any morning medications in preparation for the testing today.    Interventions: CBG 113, HR 61 sinus, BP 114/66 (73), O2 sats 100% on room air.  RR WNL.  Patient reports decreased dizziness when sitting up rather than lying flat.  Feels better with cold air fanned on her.  Patient has Ativan listed in chart as a medication, however denies ever taking this medication.  Patient states she felt very nauseous and had some reflux after eating the oatmeal this morning.  Advised patient that she should be seen and evaluated in the emergency room and suggested she be agreeable to this due to her recent cardiac history.  (CAD S/P PCI with DEC stent 5/16)  Patient refuses to be taken to the ED and insists to continuing with the testing.  Explained possible outcomes of not seeking emergency medical care if necessary.  Patient verbalizes understanding. 0909 BP 128/72 HR 59 0912 BP 136/75 HR 60 0919 148/94 HR 64  At this point in the testing, patient will wait for one hour before more images are taken.  She will remain with the staff instead of being taken back to the waiting room.  Patient is to report any new or continued symptoms to staff immediately.  Staff aware and agreeable to this plan.   Event Summary: Will continue to monitor patient while in [place].    Karren Burly

## 2015-03-06 ENCOUNTER — Encounter (HOSPITAL_COMMUNITY): Payer: Medicaid Other

## 2015-03-09 ENCOUNTER — Encounter (HOSPITAL_COMMUNITY): Payer: Medicaid Other

## 2015-03-11 ENCOUNTER — Encounter (HOSPITAL_COMMUNITY): Payer: Medicaid Other

## 2015-03-13 ENCOUNTER — Encounter (HOSPITAL_COMMUNITY): Payer: Medicaid Other

## 2015-03-16 ENCOUNTER — Encounter (HOSPITAL_COMMUNITY): Payer: Medicaid Other

## 2015-03-17 ENCOUNTER — Ambulatory Visit (INDEPENDENT_AMBULATORY_CARE_PROVIDER_SITE_OTHER): Payer: Medicaid Other | Admitting: Gastroenterology

## 2015-03-17 ENCOUNTER — Other Ambulatory Visit: Payer: Medicaid Other

## 2015-03-17 ENCOUNTER — Encounter: Payer: Self-pay | Admitting: Gastroenterology

## 2015-03-17 VITALS — BP 110/70 | HR 76 | Ht 68.0 in | Wt 193.2 lb

## 2015-03-17 DIAGNOSIS — B9681 Helicobacter pylori [H. pylori] as the cause of diseases classified elsewhere: Secondary | ICD-10-CM

## 2015-03-17 DIAGNOSIS — R1310 Dysphagia, unspecified: Secondary | ICD-10-CM | POA: Diagnosis not present

## 2015-03-17 DIAGNOSIS — R112 Nausea with vomiting, unspecified: Secondary | ICD-10-CM | POA: Diagnosis not present

## 2015-03-17 DIAGNOSIS — E039 Hypothyroidism, unspecified: Secondary | ICD-10-CM | POA: Diagnosis not present

## 2015-03-17 DIAGNOSIS — A048 Other specified bacterial intestinal infections: Secondary | ICD-10-CM

## 2015-03-17 MED ORDER — LEVOTHYROXINE SODIUM 100 MCG PO TABS: 100.0000 ug | ORAL_TABLET | Freq: Every day | ORAL | Status: AC

## 2015-03-17 MED ORDER — ONDANSETRON 8 MG PO TBDP: 8.0000 mg | ORAL_TABLET | Freq: Three times a day (TID) | ORAL | Status: DC | PRN

## 2015-03-17 NOTE — Patient Instructions (Addendum)
You have been given a separate informational sheet regarding your tobacco use, the importance of quitting and local resources to help you quit.  Your physician has requested that you go to the basement for lab work before leaving today.  We have sent the following medications to your pharmacy for you to pick up at your convenience: Synthroid, Zofran   You have been scheduled for a Barium Esophogram at [facility] (1st floor of the hospital) on 03/20/2015 at 11:00am. Please arrive 15 minutes prior to your appointment for registration. Make certain not to have anything to eat or drink 6 hours prior to your test. If you need to reschedule for any reason, please contact radiology at [phone] to do so. __________________________________________________________________ A barium swallow is an examination that concentrates on views of the esophagus. This tends to be a double contrast exam (barium and two liquids which, when combined, create a gas to distend the wall of the oesophagus) or single contrast (non-ionic iodine based). The study is usually tailored to your symptoms so a good history is essential. Attention is paid during the study to the form, structure and configuration of the esophagus, looking for functional disorders (such as aspiration, dysphagia, achalasia, motility and reflux) EXAMINATION You may be asked to change into a gown, depending on the type of swallow being performed. A radiologist and radiographer will perform the procedure. The radiologist will advise you of the type of contrast selected for your procedure and direct you during the exam. You will be asked to stand, sit or lie in several different positions and to hold a small amount of fluid in your mouth before being asked to swallow while the imaging is performed .In some instances you may be asked to swallow barium coated marshmallows to assess the motility of a solid food bolus. The exam can be recorded as a digital  or video fluoroscopy procedure. POST PROCEDURE It will take 1-2 days for the barium to pass through your system. To facilitate this, it is important, unless otherwise directed, to increase your fluids for the next 24-48hrs and to resume your normal diet.  This test typically takes about 30 minutes to perform. __________________________________________________________________________________

## 2015-03-17 NOTE — Progress Notes (Signed)
HPI :  59 y/o female here for follow up after I met her during an admission in September for workup of nausea / vomiting. She has a history of an NSTEMI with coronary stent placement in May, started on metoprolol, lipitor, and Brilinta at that time and has had severe nausea with occasional vomiting since that time. Due to concern for her ability to tolerate her Brilinta in light of stent placement she was admitted in early September for an expedited workup. She had an EGD which showed H pylori gastritis but otherwise relatively unremarkable. She was treated with a 14 day course of amoxicillian, clarithromycin, flagyl, and protonix $RemoveBefo'40mg'WxvQXEJjrik$  BID. She completed the regimen okay. She has otherwise had a negative CT abdomen, CT head, and gastric emptying study. She is here for follow up today.   She reports over time her nausea is significantly improved but is not gone. She is taking zofran scheduled which helps. She otherwise endorses some ongoing dysphagia which she did not endorse during her hospitalization. She has dysphagia to solids which occurs on most days, and eats bread or drink fluids to push the food down. She occasionally has to vomit it out. When she has dysphagia she has nausea, but outside of eating at this time she is not nauseous. She is able to keep her pills down for the most part, occasional dysphagia to pills. Previously when I had seen her she was nauseated betweem meals and had sporadic vomiting, this aspect of her symptoms has resolved. She thinks she has lost 6 lbs or so through this course when she was not able to tolerate much PO. She is trying to quit tobacco, still smoking a few cigarettes. No recent CRC screening done.   Prior workup: CT abdomen / chest - 9/14 - diverticulosis and renal cyst CT head negative EGD 02/19/15 - normal esophagus, stomach, duodenal lipomas. Biopsies c/w H pylori gastritis and treated Gastric emptying study 03/05/15 - normal   Past Medical History    Diagnosis Date  . Thyroid disease   . Coronary artery disease   . Hypertension   . Hyperlipidemia   . Nausea & vomiting   . H. pylori infection     treated Sept 2016   Past Surgical History  Procedure Laterality Date  . Thyroidectomy    . Cardiac catheterization N/A 11/04/2014    Procedure: Left Heart Cath and Coronary Angiography;  Surgeon: Jettie Booze, MD;  Location: [facility];  Service: Cardiovascular;  Laterality: N/A;  . Cardiac catheterization Right 11/04/2014    Procedure: Coronary Stent Intervention;  Surgeon: Jettie Booze, MD;  Location: [facility];  Service: Cardiovascular;  Laterality: Right;  . Esophagogastroduodenoscopy (egd) with propofol N/A 02/19/2015    Procedure: ESOPHAGOGASTRODUODENOSCOPY (EGD) WITH PROPOFOL;  Surgeon: Manus Gunning, MD;  Location: [facility];  Service: Gastroenterology;  Laterality: N/A;   Family History  Problem Relation Age of Onset  . Pulmonary embolism Mother   . CAD Father   . Diabetes Mellitus II Father   . Heart attack Mother   . Heart attack Father   . Hypertension Maternal Grandmother   . Stroke Maternal Grandmother   . Esophageal cancer Neg Hx   . Colon cancer Neg Hx   . Pancreatic cancer Neg Hx   . Stomach cancer Neg Hx   . Kidney disease Neg Hx   . Liver disease Neg Hx    Social History  Substance Use Topics  . Smoking status: Current  Every Day Smoker  . Smokeless tobacco: Never Used     Comment: form given 03/17/15  . Alcohol Use: No   Current Outpatient Prescriptions  Medication Sig Dispense Refill  . acetaminophen (TYLENOL) 325 MG tablet Take 2 tablets (650 mg total) by mouth every 4 (four) hours as needed for headache or mild pain.    Marland Kitchen aspirin 81 MG chewable tablet Chew 1 tablet (81 mg total) by mouth daily.    Marland Kitchen atorvastatin (LIPITOR) 80 MG tablet Take 1 tablet (80 mg total) by mouth daily at 6 PM. 30 tablet 11  . metoprolol tartrate (LOPRESSOR) 25 MG tablet Take 12.5 mg by  mouth daily.    . nitroGLYCERIN (NITROSTAT) 0.4 MG SL tablet Place 1 tablet (0.4 mg total) under the tongue every 5 (five) minutes as needed for chest pain. 25 tablet 2  . ondansetron (ZOFRAN-ODT) 8 MG disintegrating tablet Take 1 tablet (8 mg total) by mouth every 8 (eight) hours as needed for nausea or vomiting. 60 tablet 0  . ticagrelor (BRILINTA) 90 MG TABS tablet Take 1 tablet (90 mg total) by mouth 2 (two) times daily. 60 tablet 10  . levothyroxine (SYNTHROID, LEVOTHROID) 100 MCG tablet Take 1 tablet (100 mcg total) by mouth daily before breakfast. 100 tablet 0   No current facility-administered medications for this visit.   Allergies  Allergen Reactions  . Blue Dyes (Parenteral) Anaphylaxis    Blueberries - anaphylaxis      Review of Systems: All systems reviewed and negative except where noted in HPI.    Ct Head Wo Contrast  02/20/2015   CLINICAL DATA:  Headache and high blood pressure  EXAM: CT HEAD WITHOUT CONTRAST  TECHNIQUE: Contiguous axial images were obtained from the base of the skull through the vertex without intravenous contrast.  COMPARISON:  10/08/2007  FINDINGS: Skull and Sinuses:Negative for fracture or destructive process. The mastoids, middle ears, and imaged paranasal sinuses are clear.  Orbits: No acute abnormality.  Brain: No evidence of acute infarction, hemorrhage, hydrocephalus, or mass lesion/mass effect.  Notable dural ossification at the vertex without progression since 2009.  No evidence of white matter disease or accelerated atrophy.  IMPRESSION: Negative head CT.   Electronically Signed   By: Monte Fantasia M.D.   On: 02/20/2015 15:58   Ct Chest W Contrast  02/18/2015   CLINICAL DATA:  Lung nodule. Intermittent right lower quadrant pain with nausea vomiting for 2 weeks.  EXAM: CT CHEST, ABDOMEN, AND PELVIS WITH CONTRAST  TECHNIQUE: Multidetector CT imaging of the chest, abdomen and pelvis was performed following the standard protocol during bolus  administration of intravenous contrast.  CONTRAST:  11mL OMNIPAQUE IOHEXOL 300 MG/ML  SOLN  COMPARISON:  Chest CT 11/02/2014.  FINDINGS: CT CHEST FINDINGS  Heart is normal size. Aorta is normal caliber. Left circumflex artery stents noted. No mediastinal, hilar, or axillary adenopathy. Chest wall soft tissues are unremarkable.  Lungs are clear. No focal airspace opacities or suspicious nodules. No effusions.  CT ABDOMEN AND PELVIS FINDINGS  Liver, gallbladder, spleen, pancreas, adrenals are unremarkable. Small cysts in the right kidney which appear benign. Left kidney unremarkable. No hydronephrosis.  Appendix is visualized and is normal. There is right scratch head there are scattered diverticula in the right colon and sigmoid colon. No active diverticulitis. Stomach and small bowel are unremarkable.  Aorta is normal caliber. Uterus, adnexae and urinary bladder are unremarkable. No free fluid, free air or adenopathy.  Mild rightward thoracic scoliosis.  No acute bony  abnormality.  IMPRESSION: No acute findings or significant abnormality in the chest, abdomen or pelvis.  Right renal cysts.  Scattered colonic diverticulosis.   Electronically Signed   By: Rolm Baptise M.D.   On: 02/18/2015 09:56   Nm Gastric Emptying  03/05/2015   CLINICAL DATA:  Persistent nausea and vomiting  EXAM: NUCLEAR MEDICINE GASTRIC EMPTYING SCAN  TECHNIQUE: After oral ingestion of radiolabeled meal, sequential abdominal images were obtained for 120 minutes. Residual percentage of activity remaining within the stomach was calculated at 60 and 120 minutes.  RADIOPHARMACEUTICALS:  2.2 mCi Tc-35m MDP labeled sulfur colloid in oatmeal orally  COMPARISON:  None.  FINDINGS: Expected location of the stomach in the left upper quadrant. Ingested meal empties the stomach gradually over the course of the study with 27.2% retention at 60 min and 12.7 % retention at 120 min (normal retention less than 30% at a 120 min).  IMPRESSION: Normal gastric  emptying study.   Electronically Signed   By: Lowella Grip III M.D.   On: 03/05/2015 10:07   Ct Abdomen Pelvis W Contrast  02/18/2015   CLINICAL DATA:  Lung nodule. Intermittent right lower quadrant pain with nausea vomiting for 2 weeks.  EXAM: CT CHEST, ABDOMEN, AND PELVIS WITH CONTRAST  TECHNIQUE: Multidetector CT imaging of the chest, abdomen and pelvis was performed following the standard protocol during bolus administration of intravenous contrast.  CONTRAST:  171mL OMNIPAQUE IOHEXOL 300 MG/ML  SOLN  COMPARISON:  Chest CT 11/02/2014.  FINDINGS: CT CHEST FINDINGS  Heart is normal size. Aorta is normal caliber. Left circumflex artery stents noted. No mediastinal, hilar, or axillary adenopathy. Chest wall soft tissues are unremarkable.  Lungs are clear. No focal airspace opacities or suspicious nodules. No effusions.  CT ABDOMEN AND PELVIS FINDINGS  Liver, gallbladder, spleen, pancreas, adrenals are unremarkable. Small cysts in the right kidney which appear benign. Left kidney unremarkable. No hydronephrosis.  Appendix is visualized and is normal. There is right scratch head there are scattered diverticula in the right colon and sigmoid colon. No active diverticulitis. Stomach and small bowel are unremarkable.  Aorta is normal caliber. Uterus, adnexae and urinary bladder are unremarkable. No free fluid, free air or adenopathy.  Mild rightward thoracic scoliosis.  No acute bony abnormality.  IMPRESSION: No acute findings or significant abnormality in the chest, abdomen or pelvis.  Right renal cysts.  Scattered colonic diverticulosis.   Electronically Signed   By: Rolm Baptise M.D.   On: 02/18/2015 09:56   Dg Chest Port 1 View  02/17/2015   CLINICAL DATA:  Nausea, heartburn and chest pain for 2 weeks.  EXAM: PORTABLE CHEST - 1 VIEW  COMPARISON:  Radiographs 01/31/2015.  CT 11/02/2014  FINDINGS: The cardiomediastinal contours are normal. The previously questioned nodular density projecting over the right  upper lung on prior exam is not seen on the current exam. Pulmonary vasculature is normal. No consolidation, pleural effusion, or pneumothorax. Unchanged mild scoliotic curvature of the spine. No acute osseous abnormalities are seen.  IMPRESSION: No acute pulmonary process. The previously questioned nodular opacity projecting over the right upper lung is not seen on the current exam.   Electronically Signed   By: Jeb Levering M.D.   On: 02/17/2015 21:47   Lab Results  Component Value Date   WBC 6.8 02/19/2015   HGB 11.2* 02/19/2015   HCT 35.6* 02/19/2015   MCV 96.5 02/19/2015   PLT 192 02/19/2015   Lab Results  Component Value Date   CREATININE 0.88  02/19/2015   BUN <5* 02/19/2015   NA 145 02/19/2015   K 3.9 02/19/2015   CL 113* 02/19/2015   CO2 26 02/19/2015   Lab Results  Component Value Date   ALT 32 02/17/2015   AST 30 02/17/2015   ALKPHOS 88 02/17/2015   BILITOT 1.0 02/17/2015      Physical Exam: BP 110/70 mmHg  Pulse 76  Ht $R'5\' 8"'Ge$  (1.727 m)  Wt 193 lb 3.2 oz (87.635 kg)  BMI 29.38 kg/m2 Constitutional: Pleasant,well-developed, female in no acute distress. HEENT: Normocephalic and atraumatic. Conjunctivae are normal. No scleral icterus. Neck supple.  Cardiovascular: Normal rate, regular rhythm.  Pulmonary/chest: Effort normal and breath sounds normal. No wheezing, rales or rhonchi. Abdominal: Soft, nondistended, nontender. Bowel sounds active throughout. There are no masses palpable. No hepatomegaly. Extremities: no edema Lymphadenopathy: No cervical adenopathy noted. Neurological: Alert and oriented to person place and time. Skin: Skin is warm and dry. No rashes noted. Psychiatric: Normal mood and affect. Behavior is normal.   ASSESSMENT AND PLAN: 59 y/o female with history of NSTEMI in 10/2014 who developed severe nausea /vomiting since that time, as well as with dysphagia to solids. Due to concern about her ability to tolerate her antiplatelet therapy, when  symptoms were severe in September she was admitted for an expedited workup. CT abdomen/chest, CT head normal. EGD showed H pylori gastritis for which she was treated. Gastric emptying study normal.   Since I have seen her she is feeling much better in regards to her nausea, and is able to tolerate her pills. Unclear if this benefit is due to treatment for H pylori, or the antiemetics she has been on. Her main complaint at this time is dysphagia, of which was not an active issue when I last saw her. No pathology overtly noted on EGD to cause this. Regarding her dysphagia, I offered her esophageal manometry to rule out a motility disorder, but she did not think she could tolerate the manometry catheter. In this light, recommend a barium swallow to to get a gross assessment of motility, and pending the result may revisit the manometry if she is willing.   Otherwise, unclear etiology of her chronic nausea, which is now improved. Perhaps H pylori was playing a role, improved after therapy. Recommend H pylori stool antigen one month after completion of therapy to confirm eradication. Otherwise, unclear if some of her new medications could be related to her symptoms, as there is a clear time correlation to initiation of lipitor which can cause nausea (7% patients), Brilinta (3%) and lopressor (1%). Would continue these for now as she is better, but can revisit this issue pending her course of symptoms recur. Otherwise, recommend she try stopping scheduled zofran and try it PRN, eventually wean off. She agreed.   Of note, refilled synthroid until she can see her PCM to have this followed up.   Regarding CRC screening, it appears she is due however had NSTEMI within the past 6 months and on antiplatelet therapy. Would revisit her screening when she is one year out from her MI and can safely hold antiplatelet therapy.   [place] Cellar, MD [facility] Pager [phone]

## 2015-03-18 ENCOUNTER — Encounter (HOSPITAL_COMMUNITY): Payer: Medicaid Other

## 2015-03-20 ENCOUNTER — Ambulatory Visit (HOSPITAL_COMMUNITY)
Admission: RE | Admit: 2015-03-20 | Discharge: 2015-03-20 | Disposition: A | Discharge status: Home / Self Care | Payer: Medicaid Other | Source: Ambulatory Visit | Attending: Gastroenterology | Admitting: Gastroenterology

## 2015-03-20 ENCOUNTER — Encounter (HOSPITAL_COMMUNITY): Payer: Medicaid Other

## 2015-03-20 DIAGNOSIS — R1310 Dysphagia, unspecified: Secondary | ICD-10-CM | POA: Diagnosis present

## 2015-03-20 DIAGNOSIS — I252 Old myocardial infarction: Secondary | ICD-10-CM | POA: Insufficient documentation

## 2015-03-23 ENCOUNTER — Encounter: Payer: Self-pay | Admitting: *Deleted

## 2015-03-23 ENCOUNTER — Encounter (HOSPITAL_COMMUNITY): Payer: Medicaid Other

## 2015-03-25 ENCOUNTER — Encounter (HOSPITAL_COMMUNITY): Payer: Medicaid Other

## 2015-03-27 ENCOUNTER — Encounter (HOSPITAL_COMMUNITY): Payer: Medicaid Other

## 2015-03-30 ENCOUNTER — Encounter (HOSPITAL_COMMUNITY): Payer: Medicaid Other

## 2015-04-01 ENCOUNTER — Encounter (HOSPITAL_COMMUNITY): Payer: Medicaid Other

## 2015-04-03 ENCOUNTER — Encounter (HOSPITAL_COMMUNITY): Payer: Medicaid Other

## 2015-04-06 ENCOUNTER — Ambulatory Visit (HOSPITAL_COMMUNITY): Admission: RE | Admit: 2015-04-06 | Payer: Medicaid Other | Source: Ambulatory Visit | Admitting: Gastroenterology

## 2015-04-06 ENCOUNTER — Encounter (HOSPITAL_COMMUNITY): Admission: RE | Payer: Self-pay | Source: Ambulatory Visit

## 2015-04-06 SURGERY — MANOMETRY, ESOPHAGUS

## 2015-05-21 ENCOUNTER — Encounter: Payer: Self-pay | Admitting: Internal Medicine

## 2015-05-21 ENCOUNTER — Ambulatory Visit (INDEPENDENT_AMBULATORY_CARE_PROVIDER_SITE_OTHER): Payer: Medicaid Other | Admitting: Internal Medicine

## 2015-05-21 VITALS — BP 130/78 | HR 84 | Ht 68.0 in | Wt 192.8 lb

## 2015-05-21 DIAGNOSIS — E785 Hyperlipidemia, unspecified: Secondary | ICD-10-CM | POA: Diagnosis not present

## 2015-05-21 DIAGNOSIS — I10 Essential (primary) hypertension: Secondary | ICD-10-CM | POA: Diagnosis not present

## 2015-05-21 MED ORDER — ZOLPIDEM TARTRATE 5 MG PO TABS: 5.0000 mg | ORAL_TABLET | Freq: Every evening | ORAL | Status: DC | PRN

## 2015-05-21 NOTE — Progress Notes (Signed)
Cardiology Office Note   Date:  [id number]   ID:  Brittney Bowen [id number], MRN [id number]  PCP:  Elyn Peers, MD  Cardiologist:   Dorris Carnes, MD    F/U of CAD     History of Present Illness: Brittney Bowen is a 59 y.o. female with a history of tbacco abuse admitted 5/29-6/1 with NSTEMI. She went to the cath lab 11/04/14 and this revealed high grade OM1 disease and underwent DES placement. EF was normal on echo with grade 1 diastolic dysfunction. Post PCI course was uneventful. Since seen her biggest problem is N/V Has been going on for 2 months Cut back on eating Wt down When does eat vomits Vomits up digested food, not food chunks Upt to 4 time per day No diarrhea No F/C Food does not   Since seen she hasa been seen by GI  Rx ABX  Feeling better  Now not slepping  No CP  No SOB      Current Outpatient Prescriptions  Medication Sig Dispense Refill  . acetaminophen (TYLENOL) 325 MG tablet Take 2 tablets (650 mg total) by mouth every 4 (four) hours as needed for headache or mild pain.    Marland Kitchen aspirin 81 MG chewable tablet Chew 1 tablet (81 mg total) by mouth daily.    Marland Kitchen atorvastatin (LIPITOR) 80 MG tablet Take 1 tablet (80 mg total) by mouth daily at 6 PM. 30 tablet 11  . levothyroxine (SYNTHROID, LEVOTHROID) 100 MCG tablet Take 1 tablet (100 mcg total) by mouth daily before breakfast. 100 tablet 0  . metoprolol tartrate (LOPRESSOR) 25 MG tablet Take 12.5 mg by mouth daily.    . nitroGLYCERIN (NITROSTAT) 0.4 MG SL tablet Place 1 tablet (0.4 mg total) under the tongue every 5 (five) minutes as needed for chest pain. 25 tablet 2  . ticagrelor (BRILINTA) 90 MG TABS tablet Take 1 tablet (90 mg total) by mouth 2 (two) times daily. 60 tablet 10   No current facility-administered medications for this visit.    Allergies:   Blue dyes (parenteral) and Zofran   Past Medical History  Diagnosis Date  . Thyroid disease   . Coronary artery disease   . Hypertension     . Hyperlipidemia   . Nausea & vomiting   . H. pylori infection     treated Sept 2016    Past Surgical History  Procedure Laterality Date  . Thyroidectomy    . Cardiac catheterization N/A 11/04/2014    Procedure: Left Heart Cath and Coronary Angiography;  Surgeon: Jettie Booze, MD;  Location: [facility];  Service: Cardiovascular;  Laterality: N/A;  . Cardiac catheterization Right 11/04/2014    Procedure: Coronary Stent Intervention;  Surgeon: Jettie Booze, MD;  Location: [facility];  Service: Cardiovascular;  Laterality: Right;  . Esophagogastroduodenoscopy (egd) with propofol N/A 02/19/2015    Procedure: ESOPHAGOGASTRODUODENOSCOPY (EGD) WITH PROPOFOL;  Surgeon: Manus Gunning, MD;  Location: [facility];  Service: Gastroenterology;  Laterality: N/A;     Social History:  The patient  reports that she has been smoking.  She has never used smokeless tobacco. She reports that she does not drink alcohol or use illicit drugs.   Family History:  The patient's family history includes CAD in her father; Diabetes Mellitus II in her father; Heart attack in her father and mother; Hypertension in her maternal grandmother; Pulmonary embolism in her mother; Stroke in her maternal grandmother. There is no history of  Esophageal cancer, Colon cancer, Pancreatic cancer, Stomach cancer, Kidney disease, or Liver disease.    ROS:  Please see the history of present illness. All other systems are reviewed and  Negative to the above problem except as noted.    PHYSICAL EXAM: VS:  BP 130/78 mmHg  Pulse 84  Ht 5\' 8"  (1.727 m)  Wt 87.454 kg (192 lb 12.8 oz)  BMI 29.32 kg/m2  SpO2 97%  GEN: Well nourished, well developed, in no acute distress HEENT: normal Neck: no JVD, carotid bruits, or masses Cardiac: RRR; no murmurs, rubs, or gallops,no edema  Respiratory:  clear to auscultation bilaterally, normal work of breathing GI: soft, nontender, nondistended, + BS  No  hepatomegaly  MS: no deformity Moving all extremities   Skin: warm and dry, no rash Neuro:  Strength and sensation are intact Psych: euthymic mood, full affect   EKG:  EKG is not ordered today.   Lipid Panel    Component Value Date/Time   CHOL 123 01/08/2015 1104   TRIG 147.0 01/08/2015 1104   HDL 27.00* 01/08/2015 1104   CHOLHDL 5 01/08/2015 1104   VLDL 29.4 01/08/2015 1104   LDLCALC 67 01/08/2015 1104      Wt Readings from Last 3 Encounters:  05/21/15 87.454 kg (192 lb 12.8 oz)  03/17/15 87.635 kg (193 lb 3.2 oz)  02/19/15 90.719 kg (200 lb)      ASSESSMENT AND PLAN:  1  CAD  Doing good  Nausea has resolve so I do not think cardiac.  Continue on ASA/ Brilinta   2.  HL  Continue statin  3  Tobacco abuse  Pt cutting back  She is now smoking 1 pack over 4 days     4  Insomnia   Trial of ambien   F/U in may.     Signed, Dorris Carnes, MD  05/21/2015 8:55 AM    Brittney Bowen, [place], [place]  [address] Phone: [phone]; Fax: [phone]

## 2015-05-21 NOTE — Patient Instructions (Signed)
Your physician recommends that you continue on your current medications as directed. Please refer to the Current Medication list given to you today. Your physician wants you to follow-up in: May, 2017 with Dr. Harrington Challenger.  You will receive a reminder letter in the mail two months in advance. If you don't receive a letter, please call our office to schedule the follow-up appointment.

## 2015-06-13 ENCOUNTER — Emergency Department (HOSPITAL_COMMUNITY): Payer: Medicaid Other

## 2015-06-13 ENCOUNTER — Emergency Department (HOSPITAL_COMMUNITY)
Admission: EM | Admit: 2015-06-13 | Discharge: 2015-06-13 | Disposition: A | Discharge status: Home / Self Care | Payer: Medicaid Other | Attending: Emergency Medicine | Admitting: Emergency Medicine

## 2015-06-13 ENCOUNTER — Encounter (HOSPITAL_COMMUNITY): Payer: Self-pay | Admitting: Emergency Medicine

## 2015-06-13 DIAGNOSIS — Z79899 Other long term (current) drug therapy: Secondary | ICD-10-CM | POA: Diagnosis not present

## 2015-06-13 DIAGNOSIS — Z8619 Personal history of other infectious and parasitic diseases: Secondary | ICD-10-CM | POA: Insufficient documentation

## 2015-06-13 DIAGNOSIS — J40 Bronchitis, not specified as acute or chronic: Secondary | ICD-10-CM | POA: Diagnosis not present

## 2015-06-13 DIAGNOSIS — R197 Diarrhea, unspecified: Secondary | ICD-10-CM | POA: Diagnosis not present

## 2015-06-13 DIAGNOSIS — I2510 Atherosclerotic heart disease of native coronary artery without angina pectoris: Secondary | ICD-10-CM | POA: Diagnosis not present

## 2015-06-13 DIAGNOSIS — Z9889 Other specified postprocedural states: Secondary | ICD-10-CM | POA: Diagnosis not present

## 2015-06-13 DIAGNOSIS — I10 Essential (primary) hypertension: Secondary | ICD-10-CM | POA: Insufficient documentation

## 2015-06-13 DIAGNOSIS — Z7982 Long term (current) use of aspirin: Secondary | ICD-10-CM | POA: Insufficient documentation

## 2015-06-13 DIAGNOSIS — E079 Disorder of thyroid, unspecified: Secondary | ICD-10-CM | POA: Insufficient documentation

## 2015-06-13 DIAGNOSIS — E785 Hyperlipidemia, unspecified: Secondary | ICD-10-CM | POA: Diagnosis not present

## 2015-06-13 DIAGNOSIS — F17200 Nicotine dependence, unspecified, uncomplicated: Secondary | ICD-10-CM | POA: Diagnosis not present

## 2015-06-13 DIAGNOSIS — J069 Acute upper respiratory infection, unspecified: Secondary | ICD-10-CM | POA: Diagnosis not present

## 2015-06-13 DIAGNOSIS — R61 Generalized hyperhidrosis: Secondary | ICD-10-CM | POA: Insufficient documentation

## 2015-06-13 DIAGNOSIS — B9789 Other viral agents as the cause of diseases classified elsewhere: Secondary | ICD-10-CM

## 2015-06-13 DIAGNOSIS — R0981 Nasal congestion: Secondary | ICD-10-CM | POA: Diagnosis present

## 2015-06-13 MED ORDER — PREDNISONE 20 MG PO TABS: 40.0000 mg | ORAL_TABLET | Freq: Every day | ORAL | Status: DC

## 2015-06-13 MED ORDER — AEROCHAMBER Z-STAT PLUS/MEDIUM MISC
1.0000 | Freq: Once | Status: AC
  Administered 2015-06-13: 1

## 2015-06-13 MED ORDER — HYDROCODONE-HOMATROPINE 5-1.5 MG/5ML PO SYRP
5.0000 mL | ORAL_SOLUTION | Freq: Once | ORAL | Status: AC
  Administered 2015-06-13: 5 mL via ORAL
  Filled 2015-06-13: qty 5

## 2015-06-13 MED ORDER — FLUTICASONE PROPIONATE 50 MCG/ACT NA SUSP
2.0000 | Freq: Every day | NASAL | Status: DC
  Administered 2015-06-13: 2 via NASAL
  Filled 2015-06-13 (×2): qty 16

## 2015-06-13 MED ORDER — AEROCHAMBER Z-STAT PLUS/MEDIUM MISC: 1.0000 | Freq: Once | Status: DC

## 2015-06-13 MED ORDER — BENZONATATE 100 MG PO CAPS: 100.0000 mg | ORAL_CAPSULE | Freq: Three times a day (TID) | ORAL | Status: DC

## 2015-06-13 MED ORDER — ALBUTEROL SULFATE HFA 108 (90 BASE) MCG/ACT IN AERS: 2.0000 | INHALATION_SPRAY | RESPIRATORY_TRACT | Status: DC | PRN

## 2015-06-13 MED ORDER — PREDNISONE 20 MG PO TABS
60.0000 mg | ORAL_TABLET | Freq: Once | ORAL | Status: AC
  Administered 2015-06-13: 60 mg via ORAL
  Filled 2015-06-13: qty 3

## 2015-06-13 MED ORDER — ALBUTEROL SULFATE HFA 108 (90 BASE) MCG/ACT IN AERS
2.0000 | INHALATION_SPRAY | RESPIRATORY_TRACT | Status: DC | PRN
  Administered 2015-06-13: 2 via RESPIRATORY_TRACT
  Filled 2015-06-13: qty 6.7

## 2015-06-13 NOTE — Discharge Instructions (Signed)
1. Medications: albuterol, prednisone, mucinex, tessalon, usual home medications 2. Treatment: rest, drink plenty of fluids, take tylenol or ibuprofen for fever control 3. Follow Up: Please followup with your primary doctor in 3 days for discussion of your diagnoses and further evaluation after today's visit; if you do not have a primary care doctor use the resource guide provided to find one; Return to the ER for high fevers, difficulty breathing or other concerning symptoms    Cough, Adult Coughing is a reflex that clears your throat and your airways. Coughing helps to heal and protect your lungs. It is normal to cough occasionally, but a cough that happens with other symptoms or lasts a long time may be a sign of a condition that needs treatment. A cough may last only 2-3 weeks (acute), or it may last longer than 8 weeks (chronic). CAUSES Coughing is commonly caused by:  Breathing in substances that irritate your lungs.  A viral or bacterial respiratory infection.  Allergies.  Asthma.  Postnasal drip.  Smoking.  Acid backing up from the stomach into the esophagus (gastroesophageal reflux).  Certain medicines.  Chronic lung problems, including COPD (or rarely, lung cancer).  Other medical conditions such as heart failure. HOME CARE INSTRUCTIONS  Pay attention to any changes in your symptoms. Take these actions to help with your discomfort:  Take medicines only as told by your health care provider.  If you were prescribed an antibiotic medicine, take it as told by your health care provider. Do not stop taking the antibiotic even if you start to feel better.  Talk with your health care provider before you take a cough suppressant medicine.  Drink enough fluid to keep your urine clear or pale yellow.  If the air is dry, use a cold steam vaporizer or humidifier in your bedroom or your home to help loosen secretions.  Avoid anything that causes you to cough at work or at  home.  If your cough is worse at night, try sleeping in a semi-upright position.  Avoid cigarette smoke. If you smoke, quit smoking. If you need help quitting, ask your health care provider.  Avoid caffeine.  Avoid alcohol.  Rest as needed. SEEK MEDICAL CARE IF:   You have new symptoms.  You cough up pus.  Your cough does not get better after 2-3 weeks, or your cough gets worse.  You cannot control your cough with suppressant medicines and you are losing sleep.  You develop pain that is getting worse or pain that is not controlled with pain medicines.  You have a fever.  You have unexplained weight loss.  You have night sweats. SEEK IMMEDIATE MEDICAL CARE IF:  You cough up blood.  You have difficulty breathing.  Your heartbeat is very fast.   This information is not intended to replace advice given to you by your health care provider. Make sure you discuss any questions you have with your health care provider.   Document Released: 11/19/2010 Document Revised: 02/11/2015 Document Reviewed: 07/30/2014 Elsevier Interactive Patient Education [employer].

## 2015-06-13 NOTE — ED Provider Notes (Signed)
CSN: [id number]     Arrival date & time 06/13/15  1617 History  By signing my name below, I, Brittney Bowen, attest that this documentation has been prepared under the direction and in the presence of [employer]. Electronically Signed: Altamease Bowen, ED Scribe. 06/13/2015 6:07 PM  Chief Complaint  Patient presents with  . Nasal Congestion   The history is provided by the patient. No language interpreter was used.   Brittney Bowen is a 60 y.o. female who presents to the Emergency Department complaining of a worsening cough with onset 3 days ago. The cough was initially dry but became productive of yellow sputum last night.  Robitussin and Mucinex have provided insufficient relief in the cough at home. Associated symptoms include nasal congestion, rhinorrhea, chills, 1 day of tactile fever today that was relieved by Tylenol, diaphoresis throughout the day (denies night sweats), and 3 episodes of non bloody diarrhea yesterday. Pt denies ear pain, sore throat, neck pain, chest pain, SOB, headache, syncope. No known sick contact. Pt did not have a flu shoot this year. Smokes 1/4 PPD. Pt denies history of COPD.   Past Medical History  Diagnosis Date  . Thyroid disease   . Coronary artery disease   . Hypertension   . Hyperlipidemia   . Nausea & vomiting   . H. pylori infection     treated Sept 2016   Past Surgical History  Procedure Laterality Date  . Thyroidectomy    . Cardiac catheterization N/A 11/04/2014    Procedure: Left Heart Cath and Coronary Angiography;  Surgeon: Jettie Booze, MD;  Location: [facility];  Service: Cardiovascular;  Laterality: N/A;  . Cardiac catheterization Right 11/04/2014    Procedure: Coronary Stent Intervention;  Surgeon: Jettie Booze, MD;  Location: [facility];  Service: Cardiovascular;  Laterality: Right;  . Esophagogastroduodenoscopy (egd) with propofol N/A 02/19/2015    Procedure: ESOPHAGOGASTRODUODENOSCOPY (EGD)  WITH PROPOFOL;  Surgeon: Manus Gunning, MD;  Location: [facility];  Service: Gastroenterology;  Laterality: N/A;   Family History  Problem Relation Age of Onset  . Pulmonary embolism Mother   . CAD Father   . Diabetes Mellitus II Father   . Heart attack Mother   . Heart attack Father   . Hypertension Maternal Grandmother   . Stroke Maternal Grandmother   . Esophageal cancer Neg Hx   . Colon cancer Neg Hx   . Pancreatic cancer Neg Hx   . Stomach cancer Neg Hx   . Kidney disease Neg Hx   . Liver disease Neg Hx    Social History  Substance Use Topics  . Smoking status: Current Every Day Smoker  . Smokeless tobacco: Never Used     Comment: form given 03/17/15  . Alcohol Use: No   OB History    No data available     Review of Systems  Constitutional: Positive for fever (Tactile) and diaphoresis. Negative for chills.  HENT: Positive for congestion and rhinorrhea. Negative for ear pain and sore throat.   Respiratory: Positive for cough. Negative for shortness of breath.   Cardiovascular: Negative for chest pain.  Gastrointestinal: Positive for diarrhea. Negative for nausea and vomiting.  Musculoskeletal: Negative for neck pain.  Skin: Negative for rash.  Allergic/Immunologic: Negative for immunocompromised state.  Neurological: Negative for headaches.  Hematological: Negative for adenopathy.    Allergies  Blue dyes (parenteral) and Zofran  Home Medications   Prior to Admission medications   Medication Sig  Start Date End Date Taking? Authorizing Provider  acetaminophen (TYLENOL) 325 MG tablet Take 2 tablets (650 mg total) by mouth every 4 (four) hours as needed for headache or mild pain. 11/05/14   Erlene Quan, PA-C  aspirin 81 MG chewable tablet Chew 1 tablet (81 mg total) by mouth daily. 11/05/14   Erlene Quan, PA-C  atorvastatin (LIPITOR) 80 MG tablet Take 1 tablet (80 mg total) by mouth daily at 6 PM. 11/05/14   Erlene Quan, PA-C  benzonatate (TESSALON) 100  MG capsule Take 1 capsule (100 mg total) by mouth every 8 (eight) hours. 06/13/15   Juliocesar Blasius, PA-C  levothyroxine (SYNTHROID, LEVOTHROID) 100 MCG tablet Take 1 tablet (100 mcg total) by mouth daily before breakfast. 03/17/15   Manus Gunning, MD  metoprolol tartrate (LOPRESSOR) 25 MG tablet Take 12.5 mg by mouth daily.    Historical Provider, MD  nitroGLYCERIN (NITROSTAT) 0.4 MG SL tablet Place 1 tablet (0.4 mg total) under the tongue every 5 (five) minutes as needed for chest pain. 11/05/14   Erlene Quan, PA-C  predniSONE (DELTASONE) 20 MG tablet Take 2 tablets (40 mg total) by mouth daily. 06/13/15   Mishawn Didion, PA-C  ticagrelor (BRILINTA) 90 MG TABS tablet Take 1 tablet (90 mg total) by mouth 2 (two) times daily. 11/05/14   Erlene Quan, PA-C  zolpidem (AMBIEN) 5 MG tablet Take 1 tablet (5 mg total) by mouth at bedtime as needed for sleep. 05/21/15   [name], MD   BP 129/77 mmHg  Pulse 75  Temp(Src) 98.2 F (36.8 C) (Oral)  Resp 18  SpO2 97% Physical Exam  Constitutional: She is oriented to person, place, and time. She appears well-developed and well-nourished. No distress.  HENT:  Head: Normocephalic and atraumatic.  Right Ear: Tympanic membrane, external ear and ear canal normal.  Left Ear: Tympanic membrane, external ear and ear canal normal.  Nose: Mucosal edema and rhinorrhea present. No epistaxis. Right sinus exhibits no maxillary sinus tenderness and no frontal sinus tenderness. Left sinus exhibits no maxillary sinus tenderness and no frontal sinus tenderness.  Mouth/Throat: Uvula is midline and mucous membranes are normal. Mucous membranes are not pale and not cyanotic. No oropharyngeal exudate, posterior oropharyngeal edema, posterior oropharyngeal erythema or tonsillar abscesses.  Eyes: Conjunctivae are normal. Pupils are equal, round, and reactive to light.  Neck: Normal range of motion and full passive range of motion without pain.  Cardiovascular:  Normal rate and intact distal pulses.   Pulmonary/Chest: Effort normal. No stridor. She has wheezes.  Fine expiratory wheezes at the bases bilaterally.   Abdominal: Soft. Bowel sounds are normal. There is no tenderness.  Musculoskeletal: Normal range of motion.  Lymphadenopathy:    She has no cervical adenopathy.  Neurological: She is alert and oriented to person, place, and time.  Skin: Skin is warm and dry. No rash noted. She is not diaphoretic.  Psychiatric: She has a normal mood and affect.  Nursing note and vitals reviewed.   ED Course  Procedures (including critical care time) DIAGNOSTIC STUDIES: Oxygen Saturation is 97% on RA,  normal by my interpretation.    COORDINATION OF CARE: 5:54 PM Discussed treatment plan which includes CXR, Hycodan, Flonase, and an albuterol inhaler with pt at bedside and pt agreed to plan.   Imaging Review Dg Chest 2 View  06/13/2015  CLINICAL DATA:  Productive cough for 4 days, fever, initial encounter EXAM: CHEST - 2 VIEW COMPARISON:  02/17/2015 FINDINGS: Cardiac  shadow is within normal limits. The lungs are well aerated bilaterally. No focal infiltrate or sizable effusion is seen. Postsurgical changes are noted in the neck. IMPRESSION: No active disease. Electronically Signed   By: Inez Catalina M.D.   On: 06/13/2015 18:13   I have personally reviewed and evaluated these images as part of my medical decision-making.    MDM   Final diagnoses:  Bronchitis  Viral URI with cough   Keera Freida Busman presents with cough and congestion for several days.  Pt CXR negative for acute infiltrate. Patients symptoms are consistent with URI, likely viral etiology. Discussed that antibiotics are not indicated for viral infections. Pt will be discharged with symptomatic treatment. Patient also given albuterol here in the emergency department with improved lung sounds. She was discharged home with an MDI. Verbalizes understanding and is agreeable with plan. Pt is  hemodynamically stable & in NAD prior to dc.   I personally performed the services described in this documentation, which was scribed in my presence. The recorded information has been reviewed and is accurate.   Jarrett Soho Margi Edmundson, PA-C 06/13/15 2007  Lacretia Leigh, MD 06/17/15 [phone]

## 2015-06-13 NOTE — ED Notes (Signed)
Per pt, states cough, congestion for 3 days-no relief with OTC meds

## 2015-10-19 ENCOUNTER — Other Ambulatory Visit: Payer: Self-pay | Admitting: Cardiology

## 2015-10-19 ENCOUNTER — Encounter: Payer: Self-pay | Admitting: Internal Medicine

## 2015-10-19 ENCOUNTER — Ambulatory Visit (INDEPENDENT_AMBULATORY_CARE_PROVIDER_SITE_OTHER): Payer: Medicaid Other | Admitting: Internal Medicine

## 2015-10-19 ENCOUNTER — Ambulatory Visit (INDEPENDENT_AMBULATORY_CARE_PROVIDER_SITE_OTHER)
Admission: RE | Admit: 2015-10-19 | Discharge: 2015-10-19 | Disposition: A | Discharge status: Home / Self Care | Payer: Medicaid Other | Source: Ambulatory Visit | Attending: Internal Medicine | Admitting: Internal Medicine

## 2015-10-19 VITALS — BP 112/62 | HR 58 | Ht 68.0 in | Wt 208.0 lb

## 2015-10-19 DIAGNOSIS — I2510 Atherosclerotic heart disease of native coronary artery without angina pectoris: Secondary | ICD-10-CM | POA: Diagnosis not present

## 2015-10-19 DIAGNOSIS — E785 Hyperlipidemia, unspecified: Secondary | ICD-10-CM

## 2015-10-19 DIAGNOSIS — Z9861 Coronary angioplasty status: Secondary | ICD-10-CM | POA: Diagnosis not present

## 2015-10-19 LAB — LIPID PANEL
CHOL/HDL RATIO: 3.6 ratio (ref <=5.0)
Cholesterol: 103 mg/dL — ABNORMAL LOW (ref 125–200)
HDL: 29 mg/dL — AB (ref >=46)
LDL CALC: 47 mg/dL (ref <130)
TRIGLYCERIDES: 135 mg/dL (ref <150)
VLDL: 27 mg/dL (ref <30)

## 2015-10-19 LAB — BASIC METABOLIC PANEL
BUN: 9 mg/dL (ref 7–25)
CHLORIDE: 109 mmol/L (ref 98–110)
CO2: 22 mmol/L (ref 20–31)
Calcium: 8.7 mg/dL (ref 8.6–10.4)
Creat: 0.96 mg/dL (ref 0.50–1.05)
Glucose, Bld: 94 mg/dL (ref 65–99)
Potassium: 3.6 mmol/L (ref 3.5–5.3)
SODIUM: 144 mmol/L (ref 135–146)

## 2015-10-19 LAB — SEDIMENTATION RATE: SED RATE: 32 mm/h — AB (ref 0–30)

## 2015-10-19 LAB — CBC
HCT: 39.1 % (ref 35.0–45.0)
Hemoglobin: 13 g/dL (ref 11.7–15.5)
MCH: 31.7 pg (ref 27.0–33.0)
MCHC: 33.2 g/dL (ref 32.0–36.0)
MCV: 95.4 fL (ref 80.0–100.0)
MPV: 12.7 fL — AB (ref 7.5–12.5)
PLATELETS: 237 10*3/uL (ref 140–400)
RBC: 4.1 MIL/uL (ref 3.80–5.10)
RDW: 13.4 % (ref 11.0–15.0)
WBC: 7.1 10*3/uL (ref 3.8–10.8)

## 2015-10-19 MED ORDER — TICAGRELOR 90 MG PO TABS: 90.0000 mg | ORAL_TABLET | Freq: Two times a day (BID) | ORAL | Status: DC

## 2015-10-19 MED ORDER — METOPROLOL TARTRATE 25 MG PO TABS: 12.5000 mg | ORAL_TABLET | Freq: Every day | ORAL | Status: DC

## 2015-10-19 NOTE — Patient Instructions (Signed)
Your physician recommends that you continue on your current medications as directed. Please refer to the Current Medication list given to you today.  Your physician recommends that you return for lab work in: today (cbc, bmet, esr, lipid)  Non-Cardiac CT scanning, (CAT scanning), is a noninvasive, special x-ray that produces cross-sectional images of the body using x-rays and a computer. CT scans help physicians diagnose and treat medical conditions. For some CT exams, a contrast material is used to enhance visibility in the area of the body being studied. CT scans provide greater clarity and reveal more details than regular x-ray exams.  You have been referred to Neurology, Dr. Wells Guiles Tat.   Their office will be calling you to set up an appointment.  Your physician wants you to follow-up in: 6 months with Dr. Harrington Challenger.  You will receive a reminder letter in the mail two months in advance. If you don't receive a letter, please call our office to schedule the follow-up appointment.

## 2015-10-19 NOTE — Progress Notes (Signed)
Cardiology Office Note   Date:  [id number]   ID:  Dahna, Ellisor [id number], MRN [id number]  PCP:  Elyn Peers, MD  Cardiologist:   Dorris Carnes, MD   F/U of CAD      History of Present Illness: Brittney Bowen is a 60 y.o. female with a history of NSTIM   Cath in May 2016 revealed high grade OM1 disease and underwent DES placement. EF was normal on echo with grade 1 diastolic dysfunction. Post PCI course was uneventful. I saw the patient in December  2016  Since seen she denies CP  Breathing is OK   Does note for the past month or so weakness on L side  Arm and leg  She complains of pain on this side as well    Has to be careful not to fall  Family helps her  Outpatient Prescriptions Prior to Visit  Medication Sig Dispense Refill  . acetaminophen (TYLENOL) 325 MG tablet Take 2 tablets (650 mg total) by mouth every 4 (four) hours as needed for headache or mild pain.    Marland Kitchen aspirin 81 MG chewable tablet Chew 1 tablet (81 mg total) by mouth daily.    Marland Kitchen atorvastatin (LIPITOR) 80 MG tablet Take 1 tablet (80 mg total) by mouth daily at 6 PM. 30 tablet 11  . levothyroxine (SYNTHROID, LEVOTHROID) 100 MCG tablet Take 1 tablet (100 mcg total) by mouth daily before breakfast. 100 tablet 0  . metoprolol tartrate (LOPRESSOR) 25 MG tablet Take 12.5 mg by mouth daily.    . nitroGLYCERIN (NITROSTAT) 0.4 MG SL tablet Place 1 tablet (0.4 mg total) under the tongue every 5 (five) minutes as needed for chest pain. 25 tablet 2  . ticagrelor (BRILINTA) 90 MG TABS tablet Take 1 tablet (90 mg total) by mouth 2 (two) times daily. 60 tablet 10  . benzonatate (TESSALON) 100 MG capsule Take 1 capsule (100 mg total) by mouth every 8 (eight) hours. (Patient not taking: Reported on 10/19/2015) 21 capsule 0  . predniSONE (DELTASONE) 20 MG tablet Take 2 tablets (40 mg total) by mouth daily. (Patient not taking: Reported on 10/19/2015) 10 tablet 0  . zolpidem (AMBIEN) 5 MG tablet Take 1 tablet (5 mg total)  by mouth at bedtime as needed for sleep. (Patient not taking: Reported on 10/19/2015) 30 tablet 0   No facility-administered medications prior to visit.     Allergies:   Blue dyes (parenteral) and Zofran   Past Medical History  Diagnosis Date  . Thyroid disease   . Coronary artery disease   . Hypertension   . Hyperlipidemia   . Nausea & vomiting   . H. pylori infection     treated Sept 2016    Past Surgical History  Procedure Laterality Date  . Thyroidectomy    . Cardiac catheterization N/A 11/04/2014    Procedure: Left Heart Cath and Coronary Angiography;  Surgeon: Jettie Booze, MD;  Location: [facility];  Service: Cardiovascular;  Laterality: N/A;  . Cardiac catheterization Right 11/04/2014    Procedure: Coronary Stent Intervention;  Surgeon: Jettie Booze, MD;  Location: [facility];  Service: Cardiovascular;  Laterality: Right;  . Esophagogastroduodenoscopy (egd) with propofol N/A 02/19/2015    Procedure: ESOPHAGOGASTRODUODENOSCOPY (EGD) WITH PROPOFOL;  Surgeon: Manus Gunning, MD;  Location: [facility];  Service: Gastroenterology;  Laterality: N/A;     Social History:  The patient  reports that she has been smoking.  She has never  used smokeless tobacco. She reports that she does not drink alcohol or use illicit drugs.   Family History:  The patient's family history includes CAD in her father; Diabetes Mellitus II in her father; Heart attack in her father and mother; Hypertension in her maternal grandmother; Pulmonary embolism in her mother; Stroke in her maternal grandmother. There is no history of Esophageal cancer, Colon cancer, Pancreatic cancer, Stomach cancer, Kidney disease, or Liver disease.    ROS:  Please see the history of present illness. All other systems are reviewed and  Negative to the above problem except as noted.    PHYSICAL EXAM: VS:  BP 112/62 mmHg  Pulse 58  Ht 5\' 8"  (1.727 m)  Wt 208 lb (94.348 kg)  BMI 31.63  kg/m2  SpO2 99%  GEN: Well nourished, well developed, in no acute distress HEENT: normal Neck: no JVD, carotid bruits, or masses Cardiac: RRR; no murmurs, rubs, or gallops,no edema  Respiratory:  clear to auscultation bilaterally, normal work of breathing GI: soft, nontender, nondistended, + BS  No hepatomegaly  MS: no deformity Moving all extremities   Skin: warm and dry, no rash Neuro:  Arm and leg strength L decreased from R Psych: euthymic mood, full affect   EKG:  EKG is not ordered today.   Lipid Panel    Component Value Date/Time   CHOL 123 01/08/2015 1104   TRIG 147.0 01/08/2015 1104   HDL 27.00* 01/08/2015 1104   CHOLHDL 5 01/08/2015 1104   VLDL 29.4 01/08/2015 1104   LDLCALC 67 01/08/2015 1104      Wt Readings from Last 3 Encounters:  10/19/15 208 lb (94.348 kg)  05/21/15 192 lb 12.8 oz (87.454 kg)  03/17/15 193 lb 3.2 oz (87.635 kg)      ASSESSMENT AND PLAN:   1  Weakiness  Concerning  There is  weakness on L side on examUnusual is that she also has pain on this side   Will get labs, head CT    Refer to neuro    2  CAD   No symptoms to sugg angina  Keep on same regimen    3  HL  Will get lipid panel today     Disposition:   FU with me in the fall    Signed, Dorris Carnes, MD  10/19/2015 9:25 AM    [address], [place], [place]  [address] Phone: [phone]; Fax: [phone]

## 2015-10-20 ENCOUNTER — Other Ambulatory Visit: Payer: Self-pay | Admitting: Cardiology

## 2015-10-20 NOTE — Telephone Encounter (Signed)
REFILL 

## 2015-11-18 ENCOUNTER — Other Ambulatory Visit: Payer: Self-pay | Admitting: Cardiology

## 2015-12-07 ENCOUNTER — Ambulatory Visit (INDEPENDENT_AMBULATORY_CARE_PROVIDER_SITE_OTHER): Payer: Medicaid Other | Admitting: Neurology

## 2015-12-07 ENCOUNTER — Encounter: Payer: Self-pay | Admitting: Neurology

## 2015-12-07 VITALS — BP 130/88 | HR 61 | Ht 68.0 in | Wt 211.2 lb

## 2015-12-07 DIAGNOSIS — Z720 Tobacco use: Secondary | ICD-10-CM | POA: Diagnosis not present

## 2015-12-07 DIAGNOSIS — R292 Abnormal reflex: Secondary | ICD-10-CM

## 2015-12-07 DIAGNOSIS — R296 Repeated falls: Secondary | ICD-10-CM | POA: Diagnosis not present

## 2015-12-07 DIAGNOSIS — M6289 Other specified disorders of muscle: Secondary | ICD-10-CM

## 2015-12-07 DIAGNOSIS — F17210 Nicotine dependence, cigarettes, uncomplicated: Secondary | ICD-10-CM

## 2015-12-07 DIAGNOSIS — R531 Weakness: Secondary | ICD-10-CM

## 2015-12-07 MED ORDER — CANE MISC: 1.0000 [IU] | Freq: Once | Status: AC

## 2015-12-07 NOTE — Progress Notes (Signed)
NEUROLOGY CONSULTATION NOTE  Brittney Bowen MRN: [id number] DOB: 04-10-1956  Referring provider: Dr. Harrington Challenger Primary care provider: Dr. Criss Rosales  Reason for consult:  Left sided weakness  HISTORY OF PRESENT ILLNESS: Brittney Bowen is a 60 year old right-handed woman with CAD status post NSTEMI, cigarette smoker and hyperlipidemia who presents for left sided pain and weakness.  History obtained by patient, her husband and cardiology note.  In May 2016, she developed left sided pain in the chest, arm and leg.  She was found to have a NSTEMI at that time.  However, she continues to have pain and weakness on the left side.  In the arm, she has shoulder pain which makes it difficult to lift up her arm.  However, she has pain and some numbness in the wrist and first two digits of the hand as well.  She has pain in the left hip that radiates down the lateral leg to the foot.  She also has muscle spasms on the left.  When she walks, her left leg will sometimes just give out and she falls.  She denies loss of consciousness.  She denies pain in the leg/knee as cause of leg giving out.  She feels unsteady on her feet.  She reports neck and back pain.  She also reports some dizziness.    CT of the cervical spine from 11/02/14 was personally reviewed and showed mild degenerative changes.  CT of head from 10/19/15 was personally reviewed and only showed known 44mm meningioma in the right frontal calvarium without mass effect, stable compared to prior studies.  Labs from 10/19/15 include normal CBC and BMP with unremarkable lipid panel of total cholesterol 103, TG 135, HDL 29 and LDL 47.  Last fall was yesterday.  PAST MEDICAL HISTORY: Past Medical History  Diagnosis Date  . Thyroid disease   . Coronary artery disease   . Hypertension   . Hyperlipidemia   . Nausea & vomiting   . H. pylori infection     treated Sept 2016    PAST SURGICAL HISTORY: Past Surgical History  Procedure Laterality Date  .  Thyroidectomy    . Cardiac catheterization N/A 11/04/2014    Procedure: Left Heart Cath and Coronary Angiography;  Surgeon: Jettie Booze, MD;  Location: [facility];  Service: Cardiovascular;  Laterality: N/A;  . Cardiac catheterization Right 11/04/2014    Procedure: Coronary Stent Intervention;  Surgeon: Jettie Booze, MD;  Location: [facility];  Service: Cardiovascular;  Laterality: Right;  . Esophagogastroduodenoscopy (egd) with propofol N/A 02/19/2015    Procedure: ESOPHAGOGASTRODUODENOSCOPY (EGD) WITH PROPOFOL;  Surgeon: Manus Gunning, MD;  Location: [facility];  Service: Gastroenterology;  Laterality: N/A;    MEDICATIONS: Current Outpatient Prescriptions on File Prior to Visit  Medication Sig Dispense Refill  . acetaminophen (TYLENOL) 325 MG tablet Take 2 tablets (650 mg total) by mouth every 4 (four) hours as needed for headache or mild pain.    Marland Kitchen aspirin 81 MG chewable tablet Chew 1 tablet (81 mg total) by mouth daily.    Marland Kitchen atorvastatin (LIPITOR) 80 MG tablet TAKE 1 TABLET BY MOUTH EVERY DAY AT 6PM 30 tablet 3  . BRILINTA 90 MG TABS tablet TAKE 1 TABLET BY MOUTH TWICE A DAY 60 tablet 5  . levothyroxine (SYNTHROID, LEVOTHROID) 100 MCG tablet Take 1 tablet (100 mcg total) by mouth daily before breakfast. 100 tablet 0  . nitroGLYCERIN (NITROSTAT) 0.4 MG SL tablet Place 1 tablet (0.4 mg  total) under the tongue every 5 (five) minutes as needed for chest pain. 25 tablet 2   No current facility-administered medications on file prior to visit.    ALLERGIES: Allergies  Allergen Reactions  . Blue Dyes (Parenteral) Anaphylaxis    Blueberries - anaphylaxis   . Zofran [Ondansetron Hcl] Other (See Comments)    Dizziness and High Heart Rate    FAMILY HISTORY: Family History  Problem Relation Age of Onset  . Pulmonary embolism Mother   . CAD Father   . Diabetes Mellitus II Father   . Heart attack Mother   . Heart attack Father   . Hypertension  Maternal Grandmother   . Stroke Maternal Grandmother   . Esophageal cancer Neg Hx   . Colon cancer Neg Hx   . Pancreatic cancer Neg Hx   . Stomach cancer Neg Hx   . Kidney disease Neg Hx   . Liver disease Neg Hx     SOCIAL HISTORY: Social History   Social History  . Marital Status: Married    Spouse Name: N/A  . Number of Children: N/A  . Years of Education: N/A   Occupational History  . Not on file.   Social History Main Topics  . Smoking status: Current Every Day Smoker  . Smokeless tobacco: Never Used     Comment: form given 03/17/15  . Alcohol Use: No  . Drug Use: No  . Sexual Activity: Not on file   Other Topics Concern  . Not on file   Social History Narrative   Lives with husband in a 2 story home.  Has 3 children.  Currently not working.  Used to work as a Scientist, water quality.      REVIEW OF SYSTEMS: Constitutional: No fevers, chills, or sweats, no generalized fatigue, change in appetite Eyes: No visual changes, double vision, eye pain Ear, nose and throat: No hearing loss, ear pain, nasal congestion, sore throat Cardiovascular: No chest pain, palpitations Respiratory:  No shortness of breath at rest or with exertion, wheezes GastrointestinaI: No nausea, vomiting, diarrhea, abdominal pain, fecal incontinence Genitourinary:  No dysuria, urinary retention or frequency Musculoskeletal:  Neck pain, back pain Integumentary: No rash, pruritus, skin lesions Neurological: as above Psychiatric: No depression, insomnia, anxiety Endocrine: No palpitations, fatigue, diaphoresis, mood swings, change in appetite, change in weight, increased thirst Hematologic/Lymphatic:  No purpura, petechiae. Allergic/Immunologic: no itchy/runny eyes, nasal congestion, recent allergic reactions, rashes  PHYSICAL EXAM: Filed Vitals:   12/07/15 0754  BP: 130/88  Pulse: 61   General: No acute distress.  Patient appears well-groomed.  Head:  Normocephalic/atraumatic Eyes:  fundi  examined but not visualized Neck: supple, no paraspinal tenderness, full range of motion Back: No paraspinal tenderness Heart: regular rate and rhythm Lungs: Clear to auscultation bilaterally. Vascular: No carotid bruits. Neurological Exam: Mental status: alert and oriented to person, place, and time, recent and remote memory intact, fund of knowledge intact, attention and concentration intact, speech fluent and not dysarthric, language intact. Cranial nerves: CN I: not tested CN II: pupils equal, round and reactive to light, visual fields intact CN III, IV, VI:  full range of motion, no nystagmus, no ptosis CN V: facial sensation intact CN VII: upper and lower face symmetric CN VIII: hearing intact CN IX, X: gag intact, uvula midline CN XI: sternocleidomastoid and trapezius muscles intact CN XII: tongue midline Bulk & Tone: normal, no fasciculations. Motor:  4/5 left deltoid, triceps, wrist flexion/extension and hand grip, which may be compromised due  to pain.  Otherwise, 5/5.  She did exhibit some mild weakness in the left lower extremity which normalized with encouragement and increased effort. Sensation:  Decreased pinprick sensation over dorsum of left foot and anterior/lateral/medial lower leg.  Vibration sensation intact. Deep Tendon Reflexes:  2+ throughout, except 3+ in knees.  Toes downgoing but appears to have left Hoffman's sign. Finger to nose testing:  Without dysmetria.  Heel to shin:  Without dysmetria.  Gait:  Antalgic, unsteady gait.  Unable to tandem walk.  Romberg with sway.  IMPRESSION: Left sided weakness and pain.  Not a clear picture.  She exhibits some weakness, which may be functional due to pain, but I cannot be sure.  While pain in the leg may be radicular, other pain does not necessarily correlate with neurologic etiology.  However, the fact that she has falls, hyperreflexia, possible left Hoffman's sign, and weakness involving both upper and lower extremities,  a central etiology needs to be ruled out.  PLAN: 1.  We will get MRI of brain and cervical spine to evaluate for stroke and cervical stenosis with myelopathy. 2.  If unremarkable, would proceed with NCV-EMG of left upper and lower extremities.   3.  Further recommendations pending results. 4.  Will prescribe a cane. 5.  Discussed smoking cessation 6.  Follow up.  Thank you for allowing me to take part in the care of this patient.  Metta Clines, DO  CC:  Lucianne Lei, MD  Dorris Carnes, MD

## 2015-12-07 NOTE — Progress Notes (Signed)
Note routed

## 2015-12-07 NOTE — Patient Instructions (Addendum)
1.  We will get MRI of brain with and without contrast and cervical spine without contrast 2.  If unremarkable, we will get nerve test 3.  Follow up in 2 months 4.  Try to work on quitting smoking

## 2015-12-14 ENCOUNTER — Ambulatory Visit
Admission: RE | Admit: 2015-12-14 | Discharge: 2015-12-14 | Disposition: A | Discharge status: Home / Self Care | Payer: Medicaid Other | Source: Ambulatory Visit | Attending: Neurology | Admitting: Neurology

## 2015-12-14 ENCOUNTER — Telehealth: Payer: Self-pay

## 2015-12-14 DIAGNOSIS — R292 Abnormal reflex: Secondary | ICD-10-CM

## 2015-12-14 DIAGNOSIS — R531 Weakness: Secondary | ICD-10-CM

## 2015-12-14 DIAGNOSIS — F17210 Nicotine dependence, cigarettes, uncomplicated: Secondary | ICD-10-CM

## 2015-12-14 DIAGNOSIS — R296 Repeated falls: Secondary | ICD-10-CM

## 2015-12-14 MED ORDER — GADOBENATE DIMEGLUMINE 529 MG/ML IV SOLN
20.0000 mL | Freq: Once | INTRAVENOUS | Status: AC | PRN
  Administered 2015-12-14: 20 mL via INTRAVENOUS

## 2015-12-14 NOTE — Telephone Encounter (Signed)
-----   Message from Pieter Partridge, DO sent at 12/14/2015 11:36 AM EDT ----- MRI of brain and cervical spine show no cause for left sided weakness and falls.  I would like to proceed with NCV-EMG of left upper and lower extremities.

## 2015-12-14 NOTE — Telephone Encounter (Signed)
Message relayed to patient. Verbalized understanding and denied questions. Order placed. Message sent to scheduling staff to get pt scheduled.

## 2015-12-15 ENCOUNTER — Ambulatory Visit (INDEPENDENT_AMBULATORY_CARE_PROVIDER_SITE_OTHER): Payer: Medicaid Other | Admitting: Neurology

## 2015-12-15 DIAGNOSIS — G5602 Carpal tunnel syndrome, left upper limb: Secondary | ICD-10-CM

## 2015-12-15 DIAGNOSIS — R531 Weakness: Secondary | ICD-10-CM | POA: Diagnosis not present

## 2015-12-15 NOTE — Procedures (Signed)
[facility]  [address]  [place], [place] [address] Tel: [phone] Fax:  [phone] Test Date:  [id number]  Patient: Brittney Bowen DOB: [id number] Physician: Narda Amber, DO  Sex: Female Height: 5\' 9"  Ref Phys: Metta Clines  ID#: [id number] Temp: 32.5C Technician: Jerilynn Mages. Dean   Patient Complaints: This is a 60 year-old female referred for evaluation of left sided weakness, pain, and frequent falls.  NCV & EMG Findings: Extensive electrodiagnostic testing of the left upper and lower extremity shows: 1. Left median, ulnar, sural, and superficial peroneal sensory nerves are within normal limits.  Left palmar mixed studies are abnormal. 2. Left median, ulnar, tibial, and peroneal motor responses are within normal limits. 3. Left tibial H reflex study is within normal limits. 4. There is generalized incomplete motor unit activation pattern that somewhat hampered this study technically, most likely due to reduced voluntary effort and pain, and less likely a central disorder of motor unit control. There is no evidence of active or chronic motor axon loss changes affecting any of the tested muscles.  Impression: 1. Left median neuropathy at or distal to the wrist, consistent with the clinical diagnosis of carpal tunnel syndrome. Overall, findings are very mild in degree electrically. 2. In particular, there is no evidence of a diffuse myopathy, cervical/lumbosacral radiculopathy, or sensorimotor polyneuropathy affecting the left side.   ___________________________ Narda Amber, DO    Nerve Conduction Studies Anti Sensory Summary Table   Site NR Peak (ms) Norm Peak (ms) P-T Amp (V) Norm P-T Amp  Left Median Anti Sensory (2nd Digit)  32.5C  Wrist    3.2 <3.6 16.6 >15  Left Sup Peroneal Anti Sensory (Ant Lat Mall)  32.5C  12 cm    3.2 <4.6 6.7 >4  Left Sural Anti Sensory (Lat Mall)  32.5C  Calf    3.8 <4.6 13.6 >4  Left Ulnar Anti Sensory (5th Digit)   32.5C  Wrist    3.0 <3.1 15.5 >10   Motor Summary Table   Site NR Onset (ms) Norm Onset (ms) O-P Amp (mV) Norm O-P Amp Site1 Site2 Delta-0 (ms) Dist (cm) Vel (m/s) Norm Vel (m/s)  Left Median Motor (Abd Poll Brev)  32.5C  Wrist    3.1 <4.0 8.9 >6 Elbow Wrist 4.6 26.0 57 >50  Elbow    7.7  8.0         Left Peroneal Motor (Ext Dig Brev)  32.5C  Ankle    3.3 <6.0 7.8 >2.5 B Fib Ankle 6.5 35.0 54 >40  B Fib    9.8  7.1  Poplt B Fib 1.8 10.0 56 >40  Poplt    11.6  6.9         Left Tibial Motor (Abd Hall Brev)  32.5C  Ankle    4.0 <6.0 10.4 >4 Knee Ankle 8.6 39.0 45 >40  Knee    12.6  7.3         Left Ulnar Motor (Abd Dig Minimi)  32.5C  Wrist    2.5 <3.1 10.0 >7 B Elbow Wrist 3.5 19.0 54 >50  B Elbow    6.0  9.8  A Elbow B Elbow 1.7 10.0 59 >50  A Elbow    7.7  9.1          Comparison Summary Table   Site NR Peak (ms) Norm Peak (ms) P-T Amp (V) Site1 Site2 Delta-P (ms) Norm Delta (ms)  Left Median/Ulnar Palm Comparison (Wrist - 8cm)  32.[place]  Median Palm    2.0 <2.2 43.0 Median Palm Ulnar Palm 0.7   Ulnar Palm    1.3 <2.2 9.7       H Reflex Studies   NR H-Lat (ms) Lat Norm (ms) L-R H-Lat (ms)  Left Tibial (Gastroc)  32.5C     32.52 <35    EMG   Side Muscle Ins Act Fibs Psw Fasc Number Recrt Dur Dur. Amp Amp. Poly Poly. Comment  Left 1stDorInt Nml Nml Nml Nml 1- Mod-V Nml Nml Nml Nml Nml Nml N/A  Left AntTibialis Nml Nml Nml Nml 1- Mod-V Nml Nml Nml Nml Nml Nml N/A  Left Gastroc Nml Nml Nml Nml 2- Mod-V Nml Nml Nml Nml Nml Nml N/A  Left RectFemoris Nml Nml Nml Nml 1- Mod-V Nml Nml Nml Nml Nml Nml N/A  Left GluteusMed Nml Nml Nml Nml 1- Mod-V Nml Nml Nml Nml Nml Nml N/A  Left BicepsFemS Nml Nml Nml Nml 1- Mod-V Nml Nml Nml Nml Nml Nml N/A  Left Ext Indicis Nml Nml Nml Nml 1- Mod-V Nml Nml Nml Nml Nml Nml N/A  Left PronatorTeres Nml Nml Nml Nml 1- Mod-V Nml Nml Nml Nml Nml Nml N/A  Left Biceps Nml Nml Nml Nml 1- Mod-V Nml Nml Nml Nml Nml Nml N/A  Left Triceps Nml Nml Nml  Nml 1- Mod-V Nml Nml Nml Nml Nml Nml N/A  Left Deltoid Nml Nml Nml Nml 1- Mod-V Nml Nml Nml Nml Nml Nml N/A  Left AbductorPolBrev Nml Nml Nml Nml 1- Mod-V Nml Nml Nml Nml Nml Nml N/A     Waveforms:

## 2015-12-16 ENCOUNTER — Telehealth: Payer: Self-pay

## 2015-12-16 NOTE — Telephone Encounter (Signed)
-----   Message from Pieter Partridge, DO sent at 12/16/2015  7:11 AM EDT ----- Nerve study test shows mild evidence of carpal tunnel syndrome, which I think is incidental and not cause of symptoms.  She can try wearing a wrist splint at night to see if the symptoms in the hand improve.  Unfortunately, I do not have a neurologic explanation for her pain and falls.

## 2015-12-16 NOTE — Telephone Encounter (Signed)
Message relayed to patient. Verbalized understanding and denied questions.   

## 2015-12-16 NOTE — Telephone Encounter (Signed)
Left message on machine for pt to return call to the office.  

## 2016-02-09 ENCOUNTER — Ambulatory Visit: Payer: Medicaid Other | Admitting: Neurology

## 2016-03-23 ENCOUNTER — Other Ambulatory Visit: Payer: Self-pay | Admitting: Internal Medicine

## 2016-03-27 ENCOUNTER — Other Ambulatory Visit: Payer: Self-pay | Admitting: Internal Medicine

## 2016-03-28 NOTE — Telephone Encounter (Signed)
atorvastatin (LIPITOR) 80 MG tablet  Medication  Date: 03/23/2016 Department: [address] Office Ordering/Authorizing: [name], MD  Order Providers   Prescribing Provider Encounter Provider  [name], MD [name], MD  Medication Detail    Disp Refills Start End   atorvastatin (LIPITOR) 80 MG tablet 30 tablet 6 03/23/2016    Sig - Route: Take 1 tablet (80 mg total) by mouth daily at 6 PM. - Oral   E-Prescribing Status: Receipt confirmed by pharmacy (03/23/2016 11:14 AM EDT)   Pharmacy   [id number] - [place], [place] - [place].

## 2016-11-13 NOTE — Progress Notes (Deleted)
Cardiology Office Note   Date:  3/47/4259   ID:  Avital, Dancy [birth year], MRN [id number]  PCP:  Lucianne Lei, MD  Cardiologist:   Dorris Carnes, MD       History of Present Illness: Brittney Bowen is a 61 y.o. female with a history of NSTEMI  Cathi in May 2016 showed high grade OM lesion  Underwent DES to this  I saw the pt in May 2017         No outpatient prescriptions have been marked as taking for the 11/14/16 encounter (Appointment) with [name], MD.     Allergies:   Blue dyes (parenteral) and Zofran [name]   Past Medical History:  Diagnosis Date  . Coronary artery disease   . H. pylori infection    treated Sept 2016  . Hyperlipidemia   . Hypertension   . Nausea & vomiting   . Thyroid disease     Past Surgical History:  Procedure Laterality Date  . CARDIAC CATHETERIZATION N/A 11/04/2014   Procedure: Left Heart Cath and Coronary Angiography;  Surgeon: Jettie Booze, MD;  Location: [facility];  Service: Cardiovascular;  Laterality: N/A;  . CARDIAC CATHETERIZATION Right 11/04/2014   Procedure: Coronary Stent Intervention;  Surgeon: Jettie Booze, MD;  Location: [facility];  Service: Cardiovascular;  Laterality: Right;  . ESOPHAGOGASTRODUODENOSCOPY (EGD) WITH PROPOFOL N/A 02/19/2015   Procedure: ESOPHAGOGASTRODUODENOSCOPY (EGD) WITH PROPOFOL;  Surgeon: Manus Gunning, MD;  Location: [facility];  Service: Gastroenterology;  Laterality: N/A;  . THYROIDECTOMY       Social History:  The patient  reports that she has been smoking.  She has never used smokeless tobacco. She reports that she does not drink alcohol or use drugs.   Family History:  The patient's family history includes CAD in her father; Diabetes Mellitus II in her father; Heart attack in her father and mother; Hypertension in her maternal grandmother; Pulmonary embolism in her mother; Stroke in her maternal grandmother.    ROS:  Please see  the history of present illness. All other systems are reviewed and  Negative to the above problem except as noted.    PHYSICAL EXAM: VS:  There were no vitals taken for this visit.  GEN: Well nourished, well developed, in no acute distress  HEENT: normal  Neck: no JVD, carotid bruits, or masses Cardiac: RRR; no murmurs, rubs, or gallops,no edema  Respiratory:  clear to auscultation bilaterally, normal work of breathing GI: soft, nontender, nondistended, + BS  No hepatomegaly  MS: no deformity Moving all extremities   Skin: warm and dry, no rash Neuro:  Strength and sensation are intact Psych: euthymic mood, full affect   EKG:  EKG is ordered today.   Lipid Panel    Component Value Date/Time   CHOL 103 (L) 10/19/2015 1010   TRIG 135 10/19/2015 1010   HDL 29 (L) 10/19/2015 1010   CHOLHDL 3.6 10/19/2015 1010   VLDL 27 10/19/2015 1010   LDLCALC 47 10/19/2015 1010      Wt Readings from Last 3 Encounters:  12/07/15 95.8 kg (211 lb 3 oz)  10/19/15 94.3 kg (208 lb)  05/21/15 87.5 kg (192 lb 12.8 oz)      ASSESSMENT AND PLAN:     Current medicines are reviewed at length with the patient today.  The patient does not have concerns regarding medicines.  Signed, Dorris Carnes, MD  11/13/2016 12:00 AM    [facility] [address], [place], [place]  [address] Phone: [phone]; Fax: [phone]

## 2016-11-14 ENCOUNTER — Ambulatory Visit: Payer: Medicaid Other | Admitting: Internal Medicine

## 2016-11-15 ENCOUNTER — Encounter (HOSPITAL_COMMUNITY): Payer: Self-pay | Admitting: Emergency Medicine

## 2016-11-15 ENCOUNTER — Emergency Department (HOSPITAL_COMMUNITY)
Admission: EM | Admit: 2016-11-15 | Discharge: 2016-11-15 | Disposition: A | Discharge status: Home / Self Care | Payer: Medicaid Other | Attending: Emergency Medicine | Admitting: Emergency Medicine

## 2016-11-15 ENCOUNTER — Emergency Department (HOSPITAL_COMMUNITY): Payer: Medicaid Other

## 2016-11-15 DIAGNOSIS — Z955 Presence of coronary angioplasty implant and graft: Secondary | ICD-10-CM | POA: Insufficient documentation

## 2016-11-15 DIAGNOSIS — Z79899 Other long term (current) drug therapy: Secondary | ICD-10-CM | POA: Diagnosis not present

## 2016-11-15 DIAGNOSIS — R05 Cough: Secondary | ICD-10-CM

## 2016-11-15 DIAGNOSIS — E039 Hypothyroidism, unspecified: Secondary | ICD-10-CM | POA: Insufficient documentation

## 2016-11-15 DIAGNOSIS — I252 Old myocardial infarction: Secondary | ICD-10-CM | POA: Insufficient documentation

## 2016-11-15 DIAGNOSIS — I2510 Atherosclerotic heart disease of native coronary artery without angina pectoris: Secondary | ICD-10-CM | POA: Diagnosis not present

## 2016-11-15 DIAGNOSIS — J209 Acute bronchitis, unspecified: Secondary | ICD-10-CM | POA: Diagnosis not present

## 2016-11-15 DIAGNOSIS — I10 Essential (primary) hypertension: Secondary | ICD-10-CM | POA: Diagnosis not present

## 2016-11-15 DIAGNOSIS — F17200 Nicotine dependence, unspecified, uncomplicated: Secondary | ICD-10-CM | POA: Diagnosis not present

## 2016-11-15 DIAGNOSIS — Z7982 Long term (current) use of aspirin: Secondary | ICD-10-CM | POA: Insufficient documentation

## 2016-11-15 DIAGNOSIS — R059 Cough, unspecified: Secondary | ICD-10-CM

## 2016-11-15 MED ORDER — BENZONATATE 100 MG PO CAPS: 100.0000 mg | ORAL_CAPSULE | Freq: Three times a day (TID) | ORAL | 0 refills | Status: AC

## 2016-11-15 MED ORDER — IPRATROPIUM-ALBUTEROL 0.5-2.5 (3) MG/3ML IN SOLN
3.0000 mL | Freq: Once | RESPIRATORY_TRACT | Status: AC
  Administered 2016-11-15: 3 mL via RESPIRATORY_TRACT
  Filled 2016-11-15: qty 3

## 2016-11-15 MED ORDER — PREDNISONE 10 MG PO TABS: 40.0000 mg | ORAL_TABLET | Freq: Every day | ORAL | 0 refills | Status: AC

## 2016-11-15 MED ORDER — PREDNISONE 20 MG PO TABS
40.0000 mg | ORAL_TABLET | Freq: Once | ORAL | Status: AC
  Administered 2016-11-15: 40 mg via ORAL
  Filled 2016-11-15: qty 2

## 2016-11-15 NOTE — ED Triage Notes (Signed)
Patient c/o productive cough and congestion x8 days. Denies chest pain and SOB.

## 2016-11-15 NOTE — ED Provider Notes (Signed)
[facility] DEPT Provider Note   CSN: [id number] Arrival date & time: 11/15/16  2120     History   Chief Complaint Chief Complaint  Patient presents with  . Cough    HPI Brittney Bowen is a 61 y.o. female with PMHx CAD, HLD, HTN, lung nodule who presents today with chief complaint acute onset, gradually worsening productive cough. She states symptoms began after she took her husband to the hospital 8 days ago. She states on leaving the hospital she developed a cough that is productive of yellow sputum. She has associated nasal congestion, Subjective fevers and chills,anterior sharp chest pain with cough, And shortness of breath with increased activity. She has also had an episode of posttussive vomiting. She denies nausea, abdominal pain. Denies aggravating or alleviating factors.She was seen by her primary care and given a Z-Pak which she states has not been helpful. She was also given cough syrup which has not been helpful.She states she uses her inhaler at home which is mildly helpful. She is currently a smoker of approximately 4 cigarettes daily.  The history is provided by the patient.    Past Medical History:  Diagnosis Date  . Coronary artery disease   . H. pylori infection    treated Sept 2016  . Hyperlipidemia   . Hypertension   . Nausea & vomiting   . Thyroid disease     Patient Active Problem List   Diagnosis Date Noted  . Intractable nausea and vomiting 02/18/2015  . Lung nodule 02/18/2015  . RLQ abdominal pain 02/18/2015  . Thyroid disease 02/18/2015  . Protein-calorie malnutrition, severe ([place]) 02/18/2015  . Nausea with vomiting   . CAD S/P OM1 DES 11/04/14 11/05/2014  . Dyslipidemia 11/05/2014  . Smoker 11/05/2014  . NSTEMI (non-ST elevated myocardial infarction) ([place]) 11/03/2014  . Postoperative hypothyroidism   . Elevated troponin 11/02/2014  . Neck pain     Past Surgical History:  Procedure Laterality Date  . CARDIAC CATHETERIZATION N/A  11/04/2014   Procedure: Left Heart Cath and Coronary Angiography;  Surgeon: Jettie Booze, MD;  Location: [facility];  Service: Cardiovascular;  Laterality: N/A;  . CARDIAC CATHETERIZATION Right 11/04/2014   Procedure: Coronary Stent Intervention;  Surgeon: Jettie Booze, MD;  Location: [facility];  Service: Cardiovascular;  Laterality: Right;  . ESOPHAGOGASTRODUODENOSCOPY (EGD) WITH PROPOFOL N/A 02/19/2015   Procedure: ESOPHAGOGASTRODUODENOSCOPY (EGD) WITH PROPOFOL;  Surgeon: Manus Gunning, MD;  [facility];  Service: Gastroenterology;  Laterality: N/A;  . THYROIDECTOMY      OB History    No data available       Home Medications    Prior to Admission medications   Medication Sig Start Date End Date Taking? Authorizing Provider  acetaminophen (TYLENOL) 325 MG tablet Take 2 tablets (650 mg total) by mouth every 4 (four) hours as needed for headache or mild pain. 11/05/14   Erlene Quan, PA-C  aspirin 81 MG chewable tablet Chew 1 tablet (81 mg total) by mouth daily. 11/05/14   Erlene Quan, PA-C  atorvastatin (LIPITOR) 80 MG tablet Take 1 tablet (80 mg total) by mouth daily at 6 PM. 03/23/16   [name], MD  benzonatate (TESSALON) 100 MG capsule Take 1 capsule (100 mg total) by mouth every 8 (eight) hours. 11/15/16   Rodell Perna A, PA-C  BRILINTA 90 MG TABS tablet TAKE 1 TABLET BY MOUTH TWICE A DAY 10/20/15   Kilroy, Lurena Joiner K, PA-C  carvedilol (COREG)  6.25 MG tablet TK 1 T PO TWICE DAILY. STOP METOPROLOL 12/02/15   [provider]  levothyroxine (SYNTHROID, LEVOTHROID) 100 MCG tablet Take 1 tablet (100 mcg total) by mouth daily before breakfast. 03/17/15   Armbruster, Renelda Loma, MD  Misc. Devices (CANE) MISC 1 Units by Does not apply route once. 12/07/15   Pieter Partridge, DO  nitroGLYCERIN (NITROSTAT) 0.4 MG SL tablet Place 1 tablet (0.4 mg total) under the tongue every 5 (five) minutes as needed for chest pain. 11/05/14   Erlene Quan,  PA-C  predniSONE (DELTASONE) 10 MG tablet Take 4 tablets (40 mg total) by mouth daily with breakfast. 11/15/16 11/20/16  Renita Papa, PA-C    Family History Family History  Problem Relation Age of Onset  . Pulmonary embolism Mother   . Heart attack Mother   . CAD Father   . Diabetes Mellitus II Father   . Heart attack Father   . Hypertension Maternal Grandmother   . Stroke Maternal Grandmother   . Esophageal cancer Neg Hx   . Colon cancer Neg Hx   . Pancreatic cancer Neg Hx   . Stomach cancer Neg Hx   . Kidney disease Neg Hx   . Liver disease Neg Hx     Social History Social History  Substance Use Topics  . Smoking status: Current Every Day Smoker  . Smokeless tobacco: Never Used     Comment: form given 03/17/15  . Alcohol use No     Allergies   Blue dyes (parenteral) and Zofran [ondansetron hcl]   Review of Systems Review of Systems  Constitutional: Positive for chills and fever.  HENT: Positive for congestion.   Respiratory: Positive for cough and shortness of breath.   Cardiovascular: Positive for chest pain.  Gastrointestinal: Positive for vomiting. Negative for abdominal pain, diarrhea and nausea.  All other systems reviewed and are negative.    Physical Exam Updated Vital Signs BP 101/66 (BP Location: Right Arm)   Pulse 86   Temp 97.9 F (36.6 C) (Oral)   Resp 16   Ht 5\' 9"  (1.753 m)   Wt 93 kg (205 lb)   SpO2 93%   BMI 30.27 kg/m   Physical Exam  Constitutional: She appears well-developed and well-nourished.  HENT:  Head: Normocephalic and atraumatic.  Right Ear: External ear normal.  Left Ear: External ear normal.  Mouth/Throat: Oropharynx is clear and moist.  TMs normal bilaterally, no tenderness to palpation of the maxillary or frontal sinuses. Nasal septum is midline with pale pink mucosa and nasal congestion noted. Posterior oropharynx normal.  Eyes: Conjunctivae are normal. Right eye exhibits no discharge. Left eye exhibits no  discharge.  Neck: Neck supple. No JVD present. No tracheal deviation present.  Cardiovascular: Normal rate and regular rhythm.   Pulmonary/Chest: Effort normal. She has wheezes.  Mild diffuse expiratory wheezes in the anterior lung fields.equal rise and fall of chest, anterior chest wall tenderness to palpation near the sternum bilaterally. No accessory muscle use noted.  Abdominal: She exhibits no distension.  Musculoskeletal: She exhibits no edema.  No midline spine TTP.  Neurological: She is alert.  Skin: Skin is warm and dry.  Psychiatric: She has a normal mood and affect. Her behavior is normal.     ED Treatments / Results  Labs (all labs ordered are listed, but only abnormal results are displayed) Labs Reviewed - No data to display  EKG  EKG Interpretation None       Radiology Dg  Chest 2 View  Result Date: 11/15/2016 CLINICAL DATA:  Cough, nasal congestion and chest congestion for 1 week. Fever off and on. EXAM: CHEST  2 VIEW COMPARISON:  None. FINDINGS: The heart size and mediastinal contours are within normal limits. Both lungs are clear with probable chronic atelectasis at the right lung base medially. Thyroidectomy clips are seen at the base of the neck. IMPRESSION: No active cardiopulmonary disease. No significant change in the appearance of chest. Electronically Signed   By: Ashley Royalty M.D.   On: 11/15/2016 22:23    Procedures Procedures (including critical care time)  Medications Ordered in ED Medications  ipratropium-albuterol (DUONEB) 0.5-2.5 (3) MG/3ML nebulizer solution 3 mL (3 mLs Nebulization Given 11/15/16 2250)  predniSONE (DELTASONE) tablet 40 mg (40 mg Oral Given 11/15/16 2351)     Initial Impression / Assessment and Plan / ED Course  I have reviewed the triage vital signs and the nursing notes.  Pertinent labs & imaging results that were available during my care of the patient were reviewed by me and considered in my medical decision making (see  chart for details).     Patient with a day history of productive cough with subsequent development of shortness of breath and chest pain with cough only. Chest pain reproducible on palpation.Chest x-ray shows no active cardio pulmonary disease and no significant change from last. Low suspicion of pneumonia, pleural effusion. Suspect viral bronchitis.Mild wheezing on auscultation of lungs, given nebulizer treatment with improvement in the ED. On reevaluation wheezing has resolved. Will send patient home with a prednisone pulse for 5 days. Encouraged continued use of her albuterol inhaler. Recommend follow-up with primary care in the next 3-4 days for reevaluation. Discussed indications for return to the ED. Pt verbalized understanding of and agreement with plan and is safe for discharge home at this time.  Final Clinical Impressions(s) / ED Diagnoses   Final diagnoses:  Acute bronchitis, unspecified organism  Cough    New Prescriptions Discharge Medication List as of 11/15/2016 11:36 PM    START taking these medications   Details  benzonatate (TESSALON) 100 MG capsule Take 1 capsule (100 mg total) by mouth every 8 (eight) hours., Starting Tue 11/15/2016, Print    predniSONE (DELTASONE) 10 MG tablet Take 4 tablets (40 mg total) by mouth daily with breakfast., Starting Tue 11/15/2016, Until Sun 11/20/2016, Print         Latasia Silberstein, [place] A, PA-C 11/16/16 0030    Daleen Bo, MD 11/17/16 1248

## 2016-11-15 NOTE — Discharge Instructions (Signed)
Take prednisone as prescribed for the next 5 days. Continue using your inhaler as needed. Tessalon as needed for cough. A humidifier may be helpful.Follow-up with your primary care in the next 3-4 days for reevaluation. Return to the ED if any concerning signs or symptoms develop.

## 2016-11-25 ENCOUNTER — Encounter: Payer: Self-pay | Admitting: Internal Medicine

## 2017-01-14 ENCOUNTER — Encounter (HOSPITAL_COMMUNITY): Payer: Self-pay | Admitting: Nurse Practitioner

## 2017-01-14 ENCOUNTER — Inpatient Hospital Stay (HOSPITAL_COMMUNITY)
Admission: EM | Admit: 2017-01-14 | Discharge: 2017-01-18 | DRG: 392 | Disposition: A | Discharge status: Home / Self Care | Payer: Medicaid Other | Attending: Internal Medicine | Admitting: Internal Medicine

## 2017-01-14 ENCOUNTER — Emergency Department (HOSPITAL_COMMUNITY): Payer: Medicaid Other

## 2017-01-14 DIAGNOSIS — M25519 Pain in unspecified shoulder: Secondary | ICD-10-CM | POA: Diagnosis not present

## 2017-01-14 DIAGNOSIS — N179 Acute kidney failure, unspecified: Secondary | ICD-10-CM | POA: Diagnosis not present

## 2017-01-14 DIAGNOSIS — R079 Chest pain, unspecified: Secondary | ICD-10-CM | POA: Diagnosis present

## 2017-01-14 DIAGNOSIS — I10 Essential (primary) hypertension: Secondary | ICD-10-CM | POA: Diagnosis not present

## 2017-01-14 DIAGNOSIS — K5900 Constipation, unspecified: Secondary | ICD-10-CM | POA: Diagnosis present

## 2017-01-14 DIAGNOSIS — E039 Hypothyroidism, unspecified: Secondary | ICD-10-CM | POA: Diagnosis present

## 2017-01-14 DIAGNOSIS — I2510 Atherosclerotic heart disease of native coronary artery without angina pectoris: Secondary | ICD-10-CM

## 2017-01-14 DIAGNOSIS — Z9861 Coronary angioplasty status: Secondary | ICD-10-CM

## 2017-01-14 DIAGNOSIS — R110 Nausea: Secondary | ICD-10-CM | POA: Diagnosis not present

## 2017-01-14 DIAGNOSIS — Z7982 Long term (current) use of aspirin: Secondary | ICD-10-CM

## 2017-01-14 DIAGNOSIS — Z79899 Other long term (current) drug therapy: Secondary | ICD-10-CM

## 2017-01-14 DIAGNOSIS — Z7901 Long term (current) use of anticoagulants: Secondary | ICD-10-CM

## 2017-01-14 DIAGNOSIS — R001 Bradycardia, unspecified: Secondary | ICD-10-CM | POA: Diagnosis present

## 2017-01-14 DIAGNOSIS — Z955 Presence of coronary angioplasty implant and graft: Secondary | ICD-10-CM

## 2017-01-14 DIAGNOSIS — E890 Postprocedural hypothyroidism: Secondary | ICD-10-CM | POA: Diagnosis present

## 2017-01-14 DIAGNOSIS — E860 Dehydration: Secondary | ICD-10-CM | POA: Diagnosis present

## 2017-01-14 DIAGNOSIS — Z888 Allergy status to other drugs, medicaments and biological substances status: Secondary | ICD-10-CM

## 2017-01-14 DIAGNOSIS — Z982 Presence of cerebrospinal fluid drainage device: Secondary | ICD-10-CM

## 2017-01-14 DIAGNOSIS — K900 Celiac disease: Secondary | ICD-10-CM | POA: Diagnosis not present

## 2017-01-14 DIAGNOSIS — E785 Hyperlipidemia, unspecified: Secondary | ICD-10-CM | POA: Diagnosis present

## 2017-01-14 DIAGNOSIS — K219 Gastro-esophageal reflux disease without esophagitis: Principal | ICD-10-CM | POA: Diagnosis present

## 2017-01-14 DIAGNOSIS — F17200 Nicotine dependence, unspecified, uncomplicated: Secondary | ICD-10-CM | POA: Diagnosis present

## 2017-01-14 HISTORY — DX: Hypothyroidism, unspecified: E03.9

## 2017-01-14 LAB — CBC
HEMATOCRIT: 39 % (ref 36.0–46.0)
Hemoglobin: 12.8 g/dL (ref 12.0–15.0)
MCH: 30.9 pg (ref 26.0–34.0)
MCHC: 32.8 g/dL (ref 30.0–36.0)
MCV: 94.2 fL (ref 78.0–100.0)
PLATELETS: 238 10*3/uL (ref 150–400)
RBC: 4.14 MIL/uL (ref 3.87–5.11)
RDW: 14.2 % (ref 11.5–15.5)
WBC: 9 10*3/uL (ref 4.0–10.5)

## 2017-01-14 LAB — BASIC METABOLIC PANEL
Anion gap: 10 (ref 5–15)
BUN: 14 mg/dL (ref 6–20)
CO2: 22 mmol/L (ref 22–32)
CREATININE: 1.12 mg/dL — AB (ref 0.44–1.00)
Calcium: 8.8 mg/dL — ABNORMAL LOW (ref 8.9–10.3)
Chloride: 111 mmol/L (ref 101–111)
GFR calc non Af Amer: 52 mL/min — ABNORMAL LOW (ref >60)
Glucose, Bld: 91 mg/dL (ref 65–99)
Potassium: 4.1 mmol/L (ref 3.5–5.1)
Sodium: 143 mmol/L (ref 135–145)

## 2017-01-14 LAB — I-STAT TROPONIN, ED: Troponin i, poc: 0 ng/mL (ref 0.00–0.08)

## 2017-01-14 MED ORDER — ASPIRIN 81 MG PO CHEW: 81.0000 mg | CHEWABLE_TABLET | Freq: Every day | ORAL | Status: DC

## 2017-01-14 MED ORDER — MORPHINE SULFATE (PF) 2 MG/ML IV SOLN
2.0000 mg | INTRAVENOUS | Status: DC | PRN
  Administered 2017-01-15: 2 mg via INTRAVENOUS
  Filled 2017-01-14: qty 1

## 2017-01-14 MED ORDER — CARVEDILOL 6.25 MG PO TABS
6.2500 mg | ORAL_TABLET | Freq: Two times a day (BID) | ORAL | Status: DC
  Administered 2017-01-15 – 2017-01-18 (×8): 6.25 mg via ORAL
  Filled 2017-01-14 (×8): qty 1

## 2017-01-14 MED ORDER — TICAGRELOR 90 MG PO TABS
90.0000 mg | ORAL_TABLET | Freq: Two times a day (BID) | ORAL | Status: DC
  Administered 2017-01-15 – 2017-01-16 (×4): 90 mg via ORAL
  Filled 2017-01-14 (×4): qty 1

## 2017-01-14 MED ORDER — ZOLPIDEM TARTRATE 5 MG PO TABS: 5.0000 mg | ORAL_TABLET | Freq: Every evening | ORAL | Status: DC | PRN

## 2017-01-14 MED ORDER — BACLOFEN 20 MG PO TABS
20.0000 mg | ORAL_TABLET | Freq: Four times a day (QID) | ORAL | Status: DC
  Administered 2017-01-15 – 2017-01-18 (×13): 20 mg via ORAL
  Filled 2017-01-14 (×17): qty 1

## 2017-01-14 MED ORDER — ALPRAZOLAM 0.25 MG PO TABS: 0.2500 mg | ORAL_TABLET | Freq: Two times a day (BID) | ORAL | Status: DC | PRN

## 2017-01-14 MED ORDER — LEVOTHYROXINE SODIUM 100 MCG PO TABS
100.0000 ug | ORAL_TABLET | Freq: Every day | ORAL | Status: DC
  Administered 2017-01-15 – 2017-01-17 (×3): 100 ug via ORAL
  Filled 2017-01-14 (×3): qty 1

## 2017-01-14 MED ORDER — ATORVASTATIN CALCIUM 80 MG PO TABS
80.0000 mg | ORAL_TABLET | Freq: Every day | ORAL | Status: DC
  Administered 2017-01-15 – 2017-01-18 (×5): 80 mg via ORAL
  Filled 2017-01-14 (×5): qty 1

## 2017-01-14 MED ORDER — ASPIRIN EC 325 MG PO TBEC
325.0000 mg | DELAYED_RELEASE_TABLET | Freq: Once | ORAL | Status: AC
  Administered 2017-01-14: 325 mg via ORAL
  Filled 2017-01-14: qty 1

## 2017-01-14 MED ORDER — DIPHENHYDRAMINE HCL (SLEEP) 25 MG PO TABS: 50.0000 mg | ORAL_TABLET | Freq: Every evening | ORAL | Status: DC | PRN

## 2017-01-14 MED ORDER — SODIUM CHLORIDE 0.9 % IV BOLUS (SEPSIS)
500.0000 mL | Freq: Once | INTRAVENOUS | Status: AC
  Administered 2017-01-14: 500 mL via INTRAVENOUS

## 2017-01-14 MED ORDER — SODIUM CHLORIDE 0.9 % IV SOLN
INTRAVENOUS | Status: DC
  Administered 2017-01-15: 02:00:00 via INTRAVENOUS

## 2017-01-14 MED ORDER — BENZONATATE 100 MG PO CAPS
100.0000 mg | ORAL_CAPSULE | Freq: Three times a day (TID) | ORAL | Status: DC
  Administered 2017-01-15 – 2017-01-18 (×9): 100 mg via ORAL
  Filled 2017-01-14 (×9): qty 1

## 2017-01-14 MED ORDER — NITROGLYCERIN 0.4 MG SL SUBL: 0.4000 mg | SUBLINGUAL_TABLET | SUBLINGUAL | Status: DC | PRN

## 2017-01-14 MED ORDER — ASPIRIN 81 MG PO CHEW
81.0000 mg | CHEWABLE_TABLET | Freq: Every day | ORAL | Status: DC
  Administered 2017-01-15 – 2017-01-18 (×4): 81 mg via ORAL
  Filled 2017-01-14 (×4): qty 1

## 2017-01-14 MED ORDER — HYDROXYZINE HCL 10 MG PO TABS
10.0000 mg | ORAL_TABLET | Freq: Three times a day (TID) | ORAL | Status: DC | PRN
  Administered 2017-01-15: 10 mg via ORAL
  Filled 2017-01-14: qty 1

## 2017-01-14 MED ORDER — NICOTINE 21 MG/24HR TD PT24
21.0000 mg | MEDICATED_PATCH | Freq: Every day | TRANSDERMAL | Status: DC
  Administered 2017-01-15 – 2017-01-18 (×5): 21 mg via TRANSDERMAL
  Filled 2017-01-14 (×5): qty 1

## 2017-01-14 MED ORDER — ACETAMINOPHEN 500 MG PO TABS
500.0000 mg | ORAL_TABLET | Freq: Four times a day (QID) | ORAL | Status: DC | PRN
  Administered 2017-01-16: 500 mg via ORAL
  Filled 2017-01-14: qty 1

## 2017-01-14 MED ORDER — HEPARIN (PORCINE) IN NACL 100-0.45 UNIT/ML-% IJ SOLN
1200.0000 [IU]/h | INTRAMUSCULAR | Status: DC
  Administered 2017-01-14: 900 [IU]/h via INTRAVENOUS
  Administered 2017-01-15 – 2017-01-16 (×2): 1200 [IU]/h via INTRAVENOUS
  Filled 2017-01-14 (×3): qty 250

## 2017-01-14 MED ORDER — HEPARIN BOLUS VIA INFUSION
4000.0000 [IU] | Freq: Once | INTRAVENOUS | Status: AC
  Administered 2017-01-14: 4000 [IU] via INTRAVENOUS
  Filled 2017-01-14: qty 4000

## 2017-01-14 MED ORDER — HYDRALAZINE HCL 20 MG/ML IJ SOLN
5.0000 mg | INTRAMUSCULAR | Status: DC | PRN
Start: 2017-01-14 — End: 2017-01-18

## 2017-01-14 MED ORDER — MORPHINE SULFATE (PF) 2 MG/ML IV SOLN
4.0000 mg | Freq: Once | INTRAVENOUS | Status: AC
  Administered 2017-01-14: 4 mg via INTRAVENOUS
  Filled 2017-01-14: qty 2

## 2017-01-14 MED ORDER — GABAPENTIN 300 MG PO CAPS
300.0000 mg | ORAL_CAPSULE | Freq: Three times a day (TID) | ORAL | Status: DC
  Administered 2017-01-15 – 2017-01-18 (×12): 300 mg via ORAL
  Filled 2017-01-14 (×12): qty 1

## 2017-01-14 NOTE — ED Notes (Signed)
[facility] here to transport pt to [facility]. 

## 2017-01-14 NOTE — ED Notes (Signed)
Bed: [facility] Expected date:  Expected time:  Means of arrival:  Comments: 

## 2017-01-14 NOTE — ED Provider Notes (Addendum)
[place] DEPT Provider Note   CSN: [id number] Arrival date & time: 01/14/17  1720     History   Chief Complaint Chief Complaint  Patient presents with  . Chest Injury  . Tingling    HPI Brittney Bowen is a 61 y.o. female.  Midsternal chest pain since approximately 5:30 PM the radiation to the neck. Past medical history includes an MI in 2016 with stenting. She is nauseated and slightly dyspneic. Her hands are tingling. She tried a nitroglycerin at home which seemed to help.  Patient was driving when the symptoms started. Cardiologist is Dr. Harrington Challenger. Severity of symptoms is moderate.risk factors include hypertension, previous CAD, hyperlipidemia.      Past Medical History:  Diagnosis Date  . Coronary artery disease   . H. pylori infection    treated Sept 2016  . Hyperlipidemia   . Hypertension   . Nausea & vomiting   . Thyroid disease     Patient Active Problem List   Diagnosis Date Noted  . Intractable nausea and vomiting 02/18/2015  . Lung nodule 02/18/2015  . RLQ abdominal pain 02/18/2015  . Thyroid disease 02/18/2015  . Protein-calorie malnutrition, severe ([place]) 02/18/2015  . Nausea with vomiting   . CAD S/P OM1 DES 11/04/14 11/05/2014  . Dyslipidemia 11/05/2014  . Smoker 11/05/2014  . NSTEMI (non-ST elevated myocardial infarction) ([place]) 11/03/2014  . Postoperative hypothyroidism   . Elevated troponin 11/02/2014  . Neck pain     Past Surgical History:  Procedure Laterality Date  . CARDIAC CATHETERIZATION N/A 11/04/2014   Procedure: Left Heart Cath and Coronary Angiography;  Surgeon: Jettie Booze, MD;  Location: [facility];  Service: Cardiovascular;  Laterality: N/A;  . CARDIAC CATHETERIZATION Right 11/04/2014   Procedure: Coronary Stent Intervention;  Surgeon: Jettie Booze, MD;  Location: [facility];  Service: Cardiovascular;  Laterality: Right;  . ESOPHAGOGASTRODUODENOSCOPY (EGD) WITH PROPOFOL N/A 02/19/2015   Procedure:  ESOPHAGOGASTRODUODENOSCOPY (EGD) WITH PROPOFOL;  Surgeon: Manus Gunning, MD;  [facility];  Service: Gastroenterology;  Laterality: N/A;  . THYROIDECTOMY      OB History    No data available       Home Medications    Prior to Admission medications   Medication Sig Start Date End Date Taking? Authorizing Provider  acetaminophen (TYLENOL) 500 MG tablet Take 500 mg by mouth every 6 (six) hours as needed for moderate pain.   Yes [provider]  aspirin 81 MG chewable tablet Chew 1 tablet (81 mg total) by mouth daily. 11/05/14  Yes Kilroy, Luke K, PA-C  atorvastatin (LIPITOR) 80 MG tablet Take 1 tablet (80 mg total) by mouth daily at 6 PM. 03/23/16  Yes [name], MD  baclofen (LIORESAL) 20 MG tablet Take 20 mg by mouth 4 (four) times daily. 12/20/16  Yes [provider]  benzonatate (TESSALON) 100 MG capsule Take 1 capsule (100 mg total) by mouth every 8 (eight) hours. 11/15/16  Yes Fawze, Mina A, PA-C  BRILINTA 90 MG TABS tablet TAKE 1 TABLET BY MOUTH TWICE A DAY 10/20/15  Yes Kilroy, Luke K, PA-C  diphenhydrAMINE (SOMINEX) 25 MG tablet Take 50 mg by mouth daily as needed for itching or sleep.   Yes [provider]  gabapentin (NEURONTIN) 300 MG capsule Take 300 mg by mouth 3 (three) times daily. 12/20/16  Yes [provider]  levothyroxine (SYNTHROID, LEVOTHROID) 100 MCG tablet Take 1 tablet (100 mcg total) by mouth daily before breakfast.  03/17/15  Yes Armbruster, Renelda Loma, MD  metoprolol tartrate (LOPRESSOR) 25 MG tablet Take 12.5 mg by mouth daily.  12/20/16  Yes [provider]  [place]. Devices (CANE) MISC 1 Units by Does not apply route once. 12/07/15  Yes Jaffe, Adam R, DO  nitroGLYCERIN (NITROSTAT) 0.4 MG SL tablet Place 1 tablet (0.4 mg total) under the tongue every 5 (five) minutes as needed for chest pain. 11/05/14  Yes Kilroy, Doreene Burke, PA-C  acetaminophen (TYLENOL) 325 MG tablet Take 2 tablets (650 mg total) by mouth every  4 (four) hours as needed for headache or mild pain. Patient not taking: Reported on 01/14/2017 11/05/14   Erlene Quan, PA-C  carvedilol (COREG) 6.25 MG tablet TK 1 T PO TWICE DAILY. STOP METOPROLOL 12/02/15   [provider]    Family History Family History  Problem Relation Age of Onset  . Pulmonary embolism Mother   . Heart attack Mother   . CAD Father   . Diabetes Mellitus II Father   . Heart attack Father   . Hypertension Maternal Grandmother   . Stroke Maternal Grandmother   . Esophageal cancer Neg Hx   . Colon cancer Neg Hx   . Pancreatic cancer Neg Hx   . Stomach cancer Neg Hx   . Kidney disease Neg Hx   . Liver disease Neg Hx     Social History Social History  Substance Use Topics  . Smoking status: Current Every Day Smoker  . Smokeless tobacco: Never Used     Comment: form given 03/17/15  . Alcohol use No     Allergies   Blue dyes (parenteral) and Zofran [ondansetron hcl]   Review of Systems Review of Systems  All other systems reviewed and are negative.    Physical Exam Updated Vital Signs BP (!) 114/58   Pulse (!) 53   Temp 98.2 F (36.8 C) (Oral)   Resp 18   SpO2 98%   Physical Exam  Constitutional: She is oriented to person, place, and time. She appears well-developed and well-nourished.  HENT:  Head: Normocephalic and atraumatic.  Eyes: Conjunctivae are normal.  Neck: Neck supple.  Cardiovascular: Normal rate and regular rhythm.   Pulmonary/Chest: Effort normal and breath sounds normal.  Abdominal: Soft. Bowel sounds are normal.  Musculoskeletal: Normal range of motion.  Neurological: She is alert and oriented to person, place, and time.  Skin: Skin is warm and dry.  Psychiatric: She has a normal mood and affect. Her behavior is normal.  Nursing note and vitals reviewed.    ED Treatments / Results  Labs (all labs ordered are listed, but only abnormal results are displayed) Labs Reviewed  BASIC METABOLIC PANEL - Abnormal;  Notable for the following:       Result Value   Creatinine, Ser 1.12 (*)    Calcium 8.8 (*)    GFR calc non Af Amer 52 (*)    All other components within normal limits  CBC  I-STAT TROPONIN, ED    EKG  EKG Interpretation  Date/Time:  Saturday January 14 2017 17:31:12 EDT Ventricular Rate:  73 PR Interval:    QRS Duration: 86 QT Interval:  391 QTC Calculation: 431 R Axis:   72 Text Interpretation:  Sinus rhythm Consider left atrial enlargement Confirmed by Nat Christen [phone]) on 01/14/2017 6:44:16 PM Also confirmed by Nat Christen [phone])  on 01/14/2017 8:34:38 PM       Radiology Dg Chest 2 View  Result Date: 01/14/2017 CLINICAL  DATA:  Initial evaluation for acute upper middle chest pain. EXAM: CHEST  2 VIEW COMPARISON:  Prior radiograph from 11/15/2016. FINDINGS: The cardiac and mediastinal silhouettes are stable in size and contour, and remain within normal limits. The lungs are normally inflated. Minimal linear scarring noted at the left perihilar region, stable. No airspace consolidation, pleural effusion, or pulmonary edema is identified. There is no pneumothorax. No acute osseous abnormality identified.  Scoliosis noted. IMPRESSION: No active cardiopulmonary disease. Electronically Signed   By: Jeannine Boga M.D.   On: 01/14/2017 18:17    Procedures Procedures (including critical care time)  Medications Ordered in ED Medications  aspirin EC tablet 325 mg (not administered)  sodium chloride 0.9 % bolus 500 mL (500 mLs Intravenous New Bag/Given 01/14/17 2040)  morphine 2 MG/ML injection 4 mg (4 mg Intravenous Given 01/14/17 2040)     Initial Impression / Assessment and Plan / ED Course  I have reviewed the triage vital signs and the nursing notes.  Pertinent labs & imaging results that were available during my care of the patient were reviewed by me and considered in my medical decision making (see chart for details).     Will discuss with cardiology.  Admit to  general medicine.  Final Clinical Impressions(s) / ED Diagnoses   Final diagnoses:  Chest pain, unspecified type    New Prescriptions New Prescriptions   No medications on file     Nat Christen, MD 01/14/17 2100    Nat Christen, MD 01/14/17 2125

## 2017-01-14 NOTE — H&P (Signed)
History and Physical    Brittney Bowen VEH:[id number] DOB: [birth year] DOA: 01/14/2017  Referring MD/NP/PA:   PCP: [name], MD   Patient coming from:  The patient is coming from home.  At baseline, pt is independent for most of ADL.   Chief Complaint: chest pain  HPI: Brittney Bowen is a 61 y.o. female with medical history significant of hypertension, hyperlipidemia, CAD, myocardial infarction, stent placement, tobacco abuse, hypothyroidism, who presents with chest pain.  Patient states that her chest pain started at about 5:30 while she was driving car. Chest pain is located in the right upper chest, sharp, 10 out of 10 in severity, radiating to posterior neck and  bilateral arms, causing both arm tingling. Patient does not have SOB, cough, fever or chills. She states that she traveled to and from [place] by car on 7/27, but no tenderness in the calf areas. Patient states that she has chronic mild lower abdominal pain, but no abdominal pain today. She had diarrhea more than 10 times yesterday, which has resolved. She has nausea, but no vomiting or diarrhea currently. Denies symptoms of UTI or unilateral weakness. Pt was treated with NTG and ASA 324 in ED, her chest pain subsided, but still has chest discomfort, radiating to posterior neck.  ED Course: pt was found to have negative troponin, WBC 9.0, acute renal injury with creatinine 1.12, temperature normal, bradycardia, oxygen saturation 100% on room air, chest x-ray negative for acute abnormalities. Patient is placed on telemetry bed for observation.  Review of Systems:   General: no fevers, chills, no body weight gain, has fatigue HEENT: no blurry vision, hearing changes or sore throat Respiratory: no dyspnea, coughing, wheezing CV: has chest pain, no palpitations GI: has nausea, no vomiting, abdominal pain, diarrhea, constipation GU: no dysuria, burning on urination, increased urinary frequency, hematuria  Ext: no leg  edema Neuro: no unilateral weakness, numbness, or tingling, no vision change or hearing loss Skin: no rash, no skin tear. MSK: No muscle spasm, no deformity, no limitation of range of movement in spin Heme: No easy bruising.  Travel history: No recent long distant travel.  Allergy:  Allergies  Allergen Reactions  . Blue Dyes (Parenteral) Anaphylaxis    Blueberries - anaphylaxis   . Zofran [Ondansetron Hcl] Other (See Comments)    Dizziness and High Heart Rate    Past Medical History:  Diagnosis Date  . Coronary artery disease   . H. pylori infection    treated Sept 2016  . Hyperlipidemia   . Hypertension   . Hypothyroidism   . Nausea & vomiting   . Thyroid disease     Past Surgical History:  Procedure Laterality Date  . CARDIAC CATHETERIZATION N/A 11/04/2014   Procedure: Left Heart Cath and Coronary Angiography;  Surgeon: Jettie Booze, MD;  Location: [facility];  Service: Cardiovascular;  Laterality: N/A;  . CARDIAC CATHETERIZATION Right 11/04/2014   Procedure: Coronary Stent Intervention;  Surgeon: Jettie Booze, MD;  Location: [facility];  Service: Cardiovascular;  Laterality: Right;  . ESOPHAGOGASTRODUODENOSCOPY (EGD) WITH PROPOFOL N/A 02/19/2015   Procedure: ESOPHAGOGASTRODUODENOSCOPY (EGD) WITH PROPOFOL;  Surgeon: Manus Gunning, MD;  [facility];  Service: Gastroenterology;  Laterality: N/A;  . THYROIDECTOMY      Social History:  reports that she has been smoking.  She has never used smokeless tobacco. She reports that she does not drink alcohol or use drugs.  Family History:  Family History  Problem Relation Age  of Onset  . Pulmonary embolism Mother   . Heart attack Mother   . CAD Father   . Diabetes Mellitus II Father   . Heart attack Father   . Hypertension Maternal Grandmother   . Stroke Maternal Grandmother   . Esophageal cancer Neg Hx   . Colon cancer Neg Hx   . Pancreatic cancer Neg Hx   . Stomach cancer  Neg Hx   . Kidney disease Neg Hx   . Liver disease Neg Hx      Prior to Admission medications   Medication Sig Start Date End Date Taking? Authorizing Provider  acetaminophen (TYLENOL) 500 MG tablet Take 500 mg by mouth every 6 (six) hours as needed for moderate pain.   Yes [provider]  aspirin 81 MG chewable tablet Chew 1 tablet (81 mg total) by mouth daily. 11/05/14  Yes Kilroy, Luke K, PA-C  atorvastatin (LIPITOR) 80 MG tablet Take 1 tablet (80 mg total) by mouth daily at 6 PM. 03/23/16  Yes [name], MD  baclofen (LIORESAL) 20 MG tablet Take 20 mg by mouth 4 (four) times daily. 12/20/16  Yes [provider]  benzonatate (TESSALON) 100 MG capsule Take 1 capsule (100 mg total) by mouth every 8 (eight) hours. 11/15/16  Yes Fawze, Mina A, PA-C  BRILINTA 90 MG TABS tablet TAKE 1 TABLET BY MOUTH TWICE A DAY 10/20/15  Yes Kilroy, Luke K, PA-C  diphenhydrAMINE (SOMINEX) 25 MG tablet Take 50 mg by mouth daily as needed for itching or sleep.   Yes [provider]  gabapentin (NEURONTIN) 300 MG capsule Take 300 mg by mouth 3 (three) times daily. 12/20/16  Yes [provider]  levothyroxine (SYNTHROID, LEVOTHROID) 100 MCG tablet Take 1 tablet (100 mcg total) by mouth daily before breakfast. 03/17/15  Yes Armbruster, Renelda Loma, MD  metoprolol tartrate (LOPRESSOR) 25 MG tablet Take 12.5 mg by mouth daily.  12/20/16  Yes [provider]  [place]. Devices (CANE) MISC 1 Units by Does not apply route once. 12/07/15  Yes Jaffe, Adam R, DO  nitroGLYCERIN (NITROSTAT) 0.4 MG SL tablet Place 1 tablet (0.4 mg total) under the tongue every 5 (five) minutes as needed for chest pain. 11/05/14  Yes Kilroy, Doreene Burke, PA-C  acetaminophen (TYLENOL) 325 MG tablet Take 2 tablets (650 mg total) by mouth every 4 (four) hours as needed for headache or mild pain. Patient not taking: Reported on 01/14/2017 11/05/14   Erlene Quan, PA-C  carvedilol (COREG) 6.25 MG tablet TK 1 T PO TWICE  DAILY. STOP METOPROLOL 12/02/15   [provider]    Physical Exam: Vitals:   01/14/17 2130 01/14/17 2145 01/14/17 2200 01/14/17 2219  BP: (!) 167/98  (!) 119/52   Pulse: (!) 56 60 (!) 52   Resp: 15 15 14    Temp:      TempSrc:      SpO2: 99% 97% 99%   Weight:    95.5 kg (210 lb 9 oz)  Height:    5\' 9"  (1.753 m)   General: Not in acute distress HEENT:       Eyes: PERRL, EOMI, no scleral icterus.       ENT: No discharge from the ears and nose, no pharynx injection, no tonsillar enlargement.        Neck: No JVD, no bruit, no mass felt. Heme: No neck lymph node enlargement. Cardiac: S1/S2, RRR, No murmurs, No gallops or rubs. Respiratory: No rales, wheezing, rhonchi or rubs.  GI: Soft, nondistended, nontender, no rebound pain, no organomegaly, BS present. GU: No hematuria Ext: No pitting leg edema bilaterally. 2+DP/PT pulse bilaterally. Musculoskeletal: No joint deformities, No joint redness or warmth, no limitation of ROM in spin. Skin: No rashes.  Neuro: Alert, oriented X3, cranial nerves II-XII grossly intact, moves all extremities normally. Psych: Patient is not psychotic, no suicidal or hemocidal ideation.  Labs on Admission: I have personally reviewed following labs and imaging studies  CBC:  Recent Labs Lab 01/14/17 1758  WBC 9.0  HGB 12.8  HCT 39.0  MCV 94.2  PLT 628   Basic Metabolic Panel:  Recent Labs Lab 01/14/17 1758  NA 143  K 4.1  CL 111  CO2 22  GLUCOSE 91  BUN 14  CREATININE 1.12*  CALCIUM 8.8*   GFR: Estimated Creatinine Clearance: 65.7 mL/min (A) (by C-G formula based on SCr of 1.12 mg/dL (H)). Liver Function Tests: No results for input(s): AST, ALT, ALKPHOS, BILITOT, PROT, ALBUMIN in the last 168 hours. No results for input(s): LIPASE, AMYLASE in the last 168 hours. No results for input(s): AMMONIA in the last 168 hours. Coagulation Profile: No results for input(s): INR, PROTIME in the last 168 hours. Cardiac Enzymes: No  results for input(s): CKTOTAL, CKMB, CKMBINDEX, TROPONINI in the last 168 hours. BNP (last 3 results) No results for input(s): PROBNP in the last 8760 hours. HbA1C: No results for input(s): HGBA1C in the last 72 hours. CBG: No results for input(s): GLUCAP in the last 168 hours. Lipid Profile: No results for input(s): CHOL, HDL, LDLCALC, TRIG, CHOLHDL, LDLDIRECT in the last 72 hours. Thyroid Function Tests: No results for input(s): TSH, T4TOTAL, FREET4, T3FREE, THYROIDAB in the last 72 hours. Anemia Panel: No results for input(s): VITAMINB12, FOLATE, FERRITIN, TIBC, IRON, RETICCTPCT in the last 72 hours. Urine analysis:    Component Value Date/Time   COLORURINE AMBER (A) 02/17/2015 2129   APPEARANCEUR CLOUDY (A) 02/17/2015 2129   LABSPEC 1.024 02/17/2015 2129   PHURINE 6.0 02/17/2015 2129   GLUCOSEU NEGATIVE 02/17/2015 2129   HGBUR TRACE (A) 02/17/2015 2129   BILIRUBINUR SMALL (A) 02/17/2015 2129   KETONESUR NEGATIVE 02/17/2015 2129   PROTEINUR NEGATIVE 02/17/2015 2129   UROBILINOGEN 1.0 02/17/2015 2129   NITRITE NEGATIVE 02/17/2015 2129   LEUKOCYTESUR MODERATE (A) 02/17/2015 2129   Sepsis Labs: @LABRCNTIP (procalcitonin:4,lacticidven:4) )No results found for this or any previous visit (from the past 240 hour(s)).   Radiological Exams on Admission: Dg Chest 2 View  Result Date: 01/14/2017 CLINICAL DATA:  Initial evaluation for acute upper middle chest pain. EXAM: CHEST  2 VIEW COMPARISON:  Prior radiograph from 11/15/2016. FINDINGS: The cardiac and mediastinal silhouettes are stable in size and contour, and remain within normal limits. The lungs are normally inflated. Minimal linear scarring noted at the left perihilar region, stable. No airspace consolidation, pleural effusion, or pulmonary edema is identified. There is no pneumothorax. No acute osseous abnormality identified.  Scoliosis noted. IMPRESSION: No active cardiopulmonary disease. Electronically Signed   By: Jeannine Boga M.D.   On: 01/14/2017 18:17     EKG: Independently reviewed.  Sinus rhythm, QTC 431, low voltage, LAE, nonspecific T-wave change.   Assessment/Plan Principal Problem:   Chest pain Active Problems:   CAD S/P OM1 DES 11/04/14   Smoker   Hypothyroidism   Hypertension   Hyperlipidemia   AKI (acute kidney injury) ([place])   Chest pain and hx of CAD S/P OM1 DES: Patient had recent long distant traveling, but no signs of  DVT, no SOB and no O2 desaturation, less likely to have PE. No fever, leukocytosis, chest x-ray negative. No pneumonia. Patient has persistent chest discomfort radiating to the neck, concerning for unstable angina. Initial troponin negative. EDP consulted cardiologist, Dr. Radford Pax, who recommended to cycle the enzyms and consult to Card in AM.  - will place on Tele bed for obs - start IV heparin - cycle CE q6 x3 and repeat EKG in the am  - prn Nitroglycerin, Morphine, and aspirin, lipitor, brilint, coreg - Risk factor stratification: will check FLP, UDS and A1C  - 2d echo - Inpatient non-urgent consult order was put in [place] and message to [employer] was sent out.  AKI: pt's Cre is 1.12 and BUN 14 on admission. Likely due to prerenal secondary to dehydration due to diarrhea. - IVF: NS 75 cc/h - Check FeNa  - Follow up renal function by BMP  Hypothyroidism: Last TSH was 0.620 on 02/19/15 -Continue home Synthroid  HTN: bbp 114/58 -Patient is on both Coreg and metoprolol, will only continue Coreg since patient has bradycardia -IV hydralazine when necessary  HLD: -lipitor  Tobacco abuse: -Did counseling about importance of quitting smoking -Nicotine patch   DVT ppx: On IV Heparin   Code Status: Full code Family Communication: Yes, patient's husband at bed side Disposition Plan:  Anticipate discharge back to previous home environment Consults called:  Card, Dr. Radford Pax Admission status: Obs / tele    Date of Service 01/14/2017    Ivor [facility] Pager [phone]  If 7PM-7AM, please contact night-coverage [email or web address] Password [facility] 01/14/2017, 10:26 PM

## 2017-01-14 NOTE — ED Triage Notes (Signed)
Pt is c/o severe chest pain and tingling to both upper extremities that started about an hour ago. Also c/o lower quadrant abd pain.

## 2017-01-14 NOTE — Progress Notes (Signed)
ANTICOAGULATION CONSULT NOTE - Initial Consult  Pharmacy Consult for IV heparin Indication: Unstable angina  Allergies  Allergen Reactions  . Blue Dyes (Parenteral) Anaphylaxis    Blueberries - anaphylaxis   . Zofran [Ondansetron Hcl] Other (See Comments)    Dizziness and High Heart Rate    Patient Measurements:   Heparin Dosing Weight:  75 kg  Vital Signs: Temp: 98.2 F (36.8 C) (08/11 1737) Temp Source: Oral (08/11 1737) BP: 114/58 (08/11 2000) Pulse Rate: 53 (08/11 2000)  Labs:  Recent Labs  01/14/17 1758  HGB 12.8  HCT 39.0  PLT 238  CREATININE 1.12*    CrCl cannot be calculated (Unknown ideal weight.).   Medical History: Past Medical History:  Diagnosis Date  . Coronary artery disease   . H. pylori infection    treated Sept 2016  . Hyperlipidemia   . Hypertension   . Hypothyroidism   . Nausea & vomiting   . Thyroid disease     Medications:  Scheduled:  . [START ON 01/15/2017] aspirin  81 mg Oral Daily  . [START ON 01/15/2017] atorvastatin  80 mg Oral q1800  . baclofen  20 mg Oral QID  . benzonatate  100 mg Oral Q8H  . [START ON 01/15/2017] carvedilol  6.25 mg Oral BID WC  . gabapentin  300 mg Oral TID  . [START ON 01/15/2017] levothyroxine  100 mcg Oral QAC breakfast  . nicotine  21 mg Transdermal Daily  . ticagrelor  90 mg Oral BID   Infusions:  . sodium chloride      Assessment: 77 yoF with hx of MI now with CP.  IV heparin for unstable angina.  Goal of Therapy:  Heparin level 0.3-0.7 units/ml Monitor platelets by anticoagulation protocol: Yes   Plan:  Baseline aPtt, PT, INR and weight STAT Heparin 4000 unit bolus x1 Start drip at 900 units/hr Daily cbc/hl Check 1st HL in 6 hours  Dorrene German 01/14/2017,9:58 PM

## 2017-01-14 NOTE — ED Notes (Signed)
Bed: [facility] Expected date:  Expected time:  Means of arrival:  Comments: 

## 2017-01-15 ENCOUNTER — Observation Stay (HOSPITAL_COMMUNITY): Payer: Medicaid Other

## 2017-01-15 ENCOUNTER — Other Ambulatory Visit (HOSPITAL_COMMUNITY): Payer: Medicaid Other

## 2017-01-15 ENCOUNTER — Encounter (HOSPITAL_COMMUNITY): Payer: Self-pay | Admitting: *Deleted

## 2017-01-15 DIAGNOSIS — R0789 Other chest pain: Secondary | ICD-10-CM

## 2017-01-15 DIAGNOSIS — Z9861 Coronary angioplasty status: Secondary | ICD-10-CM

## 2017-01-15 DIAGNOSIS — R079 Chest pain, unspecified: Secondary | ICD-10-CM | POA: Diagnosis not present

## 2017-01-15 DIAGNOSIS — I2510 Atherosclerotic heart disease of native coronary artery without angina pectoris: Secondary | ICD-10-CM | POA: Diagnosis not present

## 2017-01-15 DIAGNOSIS — F17200 Nicotine dependence, unspecified, uncomplicated: Secondary | ICD-10-CM

## 2017-01-15 DIAGNOSIS — N179 Acute kidney failure, unspecified: Secondary | ICD-10-CM

## 2017-01-15 DIAGNOSIS — E782 Mixed hyperlipidemia: Secondary | ICD-10-CM

## 2017-01-15 DIAGNOSIS — I10 Essential (primary) hypertension: Secondary | ICD-10-CM

## 2017-01-15 LAB — NM MYOCAR MULTI W/SPECT W/WALL MOTION / EF
CHL CUP MPHR: 160 {beats}/min
CHL CUP RESTING HR STRESS: 46 {beats}/min
CSEPED: 0 min
CSEPEW: 1 METS
CSEPPHR: 90 {beats}/min
Exercise duration (sec): 0 s
Percent HR: 56 %

## 2017-01-15 LAB — RAPID URINE DRUG SCREEN, HOSP PERFORMED
Amphetamines: NOT DETECTED
BARBITURATES: NOT DETECTED
Benzodiazepines: NOT DETECTED
Cocaine: NOT DETECTED
Opiates: POSITIVE — AB
Tetrahydrocannabinol: NOT DETECTED

## 2017-01-15 LAB — CBC
HEMATOCRIT: 36.3 % (ref 36.0–46.0)
Hemoglobin: 11.7 g/dL — ABNORMAL LOW (ref 12.0–15.0)
MCH: 30.5 pg (ref 26.0–34.0)
MCHC: 32.2 g/dL (ref 30.0–36.0)
MCV: 94.8 fL (ref 78.0–100.0)
PLATELETS: 207 10*3/uL (ref 150–400)
RBC: 3.83 MIL/uL — ABNORMAL LOW (ref 3.87–5.11)
RDW: 14.4 % (ref 11.5–15.5)
WBC: 8.2 10*3/uL (ref 4.0–10.5)

## 2017-01-15 LAB — TROPONIN I: Troponin I: <0.03 ng/mL (ref <0.03)

## 2017-01-15 LAB — SODIUM, URINE, RANDOM: SODIUM UR: 105 mmol/L

## 2017-01-15 LAB — HEPARIN LEVEL (UNFRACTIONATED)
HEPARIN UNFRACTIONATED: 0.17 [IU]/mL — AB (ref 0.30–0.70)
Heparin Unfractionated: 0.39 IU/mL (ref 0.30–0.70)

## 2017-01-15 LAB — CREATININE, URINE, RANDOM: CREATININE, URINE: 105.99 mg/dL

## 2017-01-15 MED ORDER — TECHNETIUM TC 99M TETROFOSMIN IV KIT
30.0000 | PACK | Freq: Once | INTRAVENOUS | Status: AC | PRN
  Administered 2017-01-15: 30 via INTRAVENOUS

## 2017-01-15 MED ORDER — TECHNETIUM TC 99M TETROFOSMIN IV KIT
10.0000 | PACK | Freq: Once | INTRAVENOUS | Status: AC | PRN
  Administered 2017-01-15: 10 via INTRAVENOUS

## 2017-01-15 MED ORDER — HEPARIN BOLUS VIA INFUSION
3000.0000 [IU] | Freq: Once | INTRAVENOUS | Status: AC
  Administered 2017-01-15: 3000 [IU] via INTRAVENOUS
  Filled 2017-01-15: qty 3000

## 2017-01-15 MED ORDER — REGADENOSON 0.4 MG/5ML IV SOLN
INTRAVENOUS | Status: AC
  Filled 2017-01-15: qty 5

## 2017-01-15 MED ORDER — REGADENOSON 0.4 MG/5ML IV SOLN
0.4000 mg | Freq: Once | INTRAVENOUS | Status: DC
  Filled 2017-01-15: qty 5

## 2017-01-15 MED ORDER — PROMETHAZINE HCL 25 MG/ML IJ SOLN
25.0000 mg | Freq: Four times a day (QID) | INTRAMUSCULAR | Status: DC | PRN
  Administered 2017-01-15 – 2017-01-17 (×3): 25 mg via INTRAVENOUS
  Filled 2017-01-15 (×3): qty 1

## 2017-01-15 MED ORDER — HYDROMORPHONE HCL 1 MG/ML IJ SOLN
1.0000 mg | INTRAMUSCULAR | Status: DC | PRN
  Administered 2017-01-15: 1 mg via INTRAVENOUS
  Filled 2017-01-15: qty 1

## 2017-01-15 NOTE — Progress Notes (Signed)
PROGRESS NOTE    Brittney Bowen  YCX:[id number] DOB: 06/14/1955 DOA: 01/14/2017 PCP: [name], MD   Brief Narrative:  61 y.o. female with medical history significant of hypertension, hyperlipidemia, CAD, myocardial infarction, stent placement, tobacco abuse, hypothyroidism, who presents with chest pain.  Patient states that her chest pain started at about 5:30 while she was driving car. Chest pain is located in the right upper chest, sharp, 10 out of 10 in severity, radiating to posterior neck and  bilateral arms, causing both arm tingling. Patient does not have SOB, cough, fever or chills. She states that she traveled to and from [place] by car recently. Patient states that she has chronic mild lower abdominal pain, but no abdominal pain today. She had diarrhea more than 10 times yesterday, which has resolved. She has nausea, but no vomiting or diarrhea currently. Pt was treated with NTG and ASA 325 in ED, her chest pain subsided, but still has behind her shoulder.she had a stress test today.cardiac enzymes are normal.   Assessment & Plan:   Principal Problem:   Chest pain Active Problems:   CAD S/P OM1 DES 11/04/14   Smoker   Hypothyroidism   Hypertension   Hyperlipidemia   AKI (acute kidney injury) ([place])   Chest pain sounds atypical.but she feels this pain is similar to the pain she had when she had a cardiac event.cardiology following. Hypertension stable on lopreesor. hypothyroidsm continue synthroid. Hyperlipidemia on lipitor.  DVT prophylaxis: heparin Code Status: full Family Communication:  Disposition Plan: home when stable.   Consultants: cardiology  Procedures: stress test  Antimicrobials: none   Subjective:patient co shoulder pain reproducible  Objective: Vitals:   01/15/17 1241 01/15/17 1242 01/15/17 1244 01/15/17 1353  BP: 104/83 106/75 102/71 (!) 109/45  Pulse:    (!) 59  Resp:    20  Temp:    97.8 F (36.6 C)  TempSrc:    Oral  SpO2:    97%   Weight:      Height:        Intake/Output Summary (Last 24 hours) at 01/15/17 1525 Last data filed at 01/15/17 0940  Gross per 24 hour  Intake              500 ml  Output              200 ml  Net              300 ml   Filed Weights   01/14/17 2219 01/14/17 2325  Weight: 95.5 kg (210 lb 9 oz) 95.4 kg (210 lb 6.4 oz)    Examination:  General exam: Appears calm and comfortable  Respiratory system: Clear to auscultation. Respiratory effort normal. Cardiovascular system: S1 & S2 heard, RRR. No JVD, murmurs, rubs, gallops or clicks. No pedal edema. Gastrointestinal system: Abdomen is nondistended, soft and nontender. No organomegaly or masses felt. Normal bowel sounds heard. Central nervous system: Alert and oriented. No focal neurological deficits. Extremities: Symmetric 5 x 5 power. Skin: No rashes, lesions or ulcers Psychiatry: Judgement and insight appear normal. Mood & affect appropriate.     Data Reviewed: I have personally reviewed following labs and imaging studies  CBC:  Recent Labs Lab 01/14/17 1758 01/15/17 0525  WBC 9.0 8.2  HGB 12.8 11.7*  HCT 39.0 36.3  MCV 94.2 94.8  PLT 238 497   Basic Metabolic Panel:  Recent Labs Lab 01/14/17 1758  NA 143  K 4.1  CL 111  CO2 22  GLUCOSE 91  BUN 14  CREATININE 1.12*  CALCIUM 8.8*   GFR: Estimated Creatinine Clearance: 65.7 mL/min (A) (by C-G formula based on SCr of 1.12 mg/dL (H)). Liver Function Tests: No results for input(s): AST, ALT, ALKPHOS, BILITOT, PROT, ALBUMIN in the last 168 hours. No results for input(s): LIPASE, AMYLASE in the last 168 hours. No results for input(s): AMMONIA in the last 168 hours. Coagulation Profile: No results for input(s): INR, PROTIME in the last 168 hours. Cardiac Enzymes:  Recent Labs Lab 01/15/17 0928  TROPONINI <0.03   BNP (last 3 results) No results for input(s): PROBNP in the last 8760 hours. HbA1C: No results for input(s): HGBA1C in the last 72  hours. CBG: No results for input(s): GLUCAP in the last 168 hours. Lipid Profile: No results for input(s): CHOL, HDL, LDLCALC, TRIG, CHOLHDL, LDLDIRECT in the last 72 hours. Thyroid Function Tests: No results for input(s): TSH, T4TOTAL, FREET4, T3FREE, THYROIDAB in the last 72 hours. Anemia Panel: No results for input(s): VITAMINB12, FOLATE, FERRITIN, TIBC, IRON, RETICCTPCT in the last 72 hours. Sepsis Labs: No results for input(s): PROCALCITON, LATICACIDVEN in the last 168 hours.  No results found for this or any previous visit (from the past 240 hour(s)).       Radiology Studies: Dg Chest 2 View  Result Date: 01/14/2017 CLINICAL DATA:  Initial evaluation for acute upper middle chest pain. EXAM: CHEST  2 VIEW COMPARISON:  Prior radiograph from 11/15/2016. FINDINGS: The cardiac and mediastinal silhouettes are stable in size and contour, and remain within normal limits. The lungs are normally inflated. Minimal linear scarring noted at the left perihilar region, stable. No airspace consolidation, pleural effusion, or pulmonary edema is identified. There is no pneumothorax. No acute osseous abnormality identified.  Scoliosis noted. IMPRESSION: No active cardiopulmonary disease. Electronically Signed   By: Jeannine Boga M.D.   On: 01/14/2017 18:17   Nm Myocar Multi W/spect W/wall Motion / Ef  Result Date: 01/15/2017 CLINICAL DATA:  Chest pain, hypertension, coronary artery disease, elevated triglycerides EXAM: MYOCARDIAL IMAGING WITH SPECT (REST AND PHARMACOLOGIC-STRESS) GATED LEFT VENTRICULAR WALL MOTION STUDY LEFT VENTRICULAR EJECTION FRACTION TECHNIQUE: Standard myocardial SPECT imaging was performed after resting intravenous injection of 10 mCi Tc-52m tetrofosmin. Subsequently, intravenous infusion of Lexiscan was performed under the supervision of the Cardiology staff. At peak effect of the drug, 30 mCi Tc-34m tetrofosmin was injected intravenously and standard myocardial SPECT  imaging was performed. Quantitative gated imaging was also performed to evaluate left ventricular wall motion, and estimate left ventricular ejection fraction. COMPARISON:  None FINDINGS: Perfusion: Moderate-sized perfusion defects identified at the inferolateral wall LEFT ventricle. Majority of defect remains unchanged on resting exam with minimal reperfusion questioned at the lateral margin. Wall Motion: Normal left ventricular wall motion. No left ventricular dilation. Left Ventricular Ejection Fraction: 59 % End diastolic volume 99 ml End systolic volume 40 ml IMPRESSION: 1. Predominant non reversible zone of decreased myocardial perfusion at the inferolateral wall LEFT ventricle with question minimal reperfusion at the lateral margin. 2. Normal left ventricular wall motion. 3. Left ventricular ejection fraction 59% 4. Non invasive risk stratification*: Low *2012 Appropriate Use Criteria for Coronary Revascularization Focused Update: J Am Coll Cardiol. 7858;85(0):277-412. http://content.airportbarriers.com.aspx?articleid=[email or web address] Electronically Signed   By: Lavonia Dana M.D.   On: 01/15/2017 14:57        Scheduled Meds: . aspirin  81 mg Oral Daily  . atorvastatin  80 mg Oral q1800  . baclofen  20 mg Oral QID  . benzonatate  100  mg Oral Q8H  . carvedilol  6.25 mg Oral BID WC  . gabapentin  300 mg Oral TID  . levothyroxine  100 mcg Oral QAC breakfast  . nicotine  21 mg Transdermal Daily  . regadenoson      . regadenoson  0.4 mg Intravenous Once  . ticagrelor  90 mg Oral BID   Continuous Infusions: . heparin 1,200 Units/hr (01/15/17 1447)     LOS: 0 days    Time spent: 35 min    Georgette Shell, MD [facility] Pager [phone]  If 7PM-7AM, please contact night-coverage [email or web address] Password [facility] 01/15/2017, 3:25 PM

## 2017-01-15 NOTE — Progress Notes (Signed)
Patients nausea resolved with Phenergan.  Was able to sleep for an hour or more with family at bedside.  Patient continues to be quite upset after stress test, anxious requiring multiple medications for pain and nausea.  VSS, continually denies chest pain.  Able to walk to bathroom with standby assist though groggy after receiving Phenergan.  Will continue to monitor.

## 2017-01-15 NOTE — Progress Notes (Signed)
Patient back from stress test has been in pain in her back and shoulder since returning as well as very nauseous.  No relief from Morphine, some relief from Dilaudid.  Atarax for nausea given (patient allergic to Zofran).  Patient still vomiting, notified MD.

## 2017-01-15 NOTE — Progress Notes (Signed)
ANTICOAGULATION CONSULT NOTE - Follow Up Consult  Pharmacy Consult for Heparin Indication: chest pain/ACS  Allergies  Allergen Reactions  . Blue Dyes (Parenteral) Anaphylaxis    Blueberries - anaphylaxis   . Zofran [Ondansetron Hcl] Other (See Comments)    Dizziness and High Heart Rate    Patient Measurements: Height: 5\' 9"  (175.3 cm) Weight: 210 lb 6.4 oz (95.4 kg) IBW/kg (Calculated) : 66.2 Heparin Dosing Weight: 75kg  Vital Signs: Temp: 97.8 F (36.6 C) (08/12 1353) Temp Source: Oral (08/12 1353) BP: 109/45 (08/12 1353) Pulse Rate: 59 (08/12 1353)  Labs:  Recent Labs  01/14/17 1758 01/15/17 0525 01/15/17 0556 01/15/17 0928 01/15/17 1511  HGB 12.8 11.7*  --   --   --   HCT 39.0 36.3  --   --   --   PLT 238 207  --   --   --   HEPARINUNFRC  --   --  0.17*  --  0.39  CREATININE 1.12*  --   --   --   --   TROPONINI  --   --   --  <0.03  --     Estimated Creatinine Clearance: 65.7 mL/min (A) (by C-G formula based on SCr of 1.12 mg/dL (H)).   Medications:  Heparin @ 1200 units/hr  Assessment: 61yof s/p stress test today continues on heparin for unstable angina. Heparin level is therapeutic at 0.39.   Goal of Therapy:  Heparin level 0.3-0.7 units/ml Monitor platelets by anticoagulation protocol: Yes   Plan:  1) Continue heparin at 1200 units/hr 2) Follow up daily heparin level and CBC  Deboraha Sprang 01/15/2017,5:11 PM

## 2017-01-15 NOTE — Progress Notes (Signed)
ANTICOAGULATION CONSULT NOTE - Follow Up Consult  Pharmacy Consult for heparin Indication: USAP  Labs:  Recent Labs  01/14/17 1758 01/15/17 0525 01/15/17 0556  HGB 12.8 11.7*  --   HCT 39.0 36.3  --   PLT 238 207  --   HEPARINUNFRC  --   --  0.17*  CREATININE 1.12*  --   --     Assessment: 61yo female subtherapeutic on heparin with initial dosing for USAP.  Goal of Therapy:  Heparin level 0.3-0.7 units/ml   Plan:  Will rebolus with heparin 3000 units and increase gtt by 3 units/kg/hr to 1200 units/hr and check level in 6hr.  Wynona Neat, PharmD, BCPS  01/15/2017,7:42 AM

## 2017-01-15 NOTE — Progress Notes (Signed)
   Billiejean Freida Busman presented for a [employer] today.  No immediate complications.  Stress imaging is pending at this time.  Murray Hodgkins, NP 01/15/2017, 12:49 PM

## 2017-01-15 NOTE — Consult Note (Signed)
Cardiology Consultation:   Patient ID: Brittney Bowen; [id number]; [birth year]   Admit date: 01/14/2017 Date of Consult: 01/15/2017  Primary Care Provider: [name], MD Primary Cardiologist: Dr. Harrington Challenger   Patient Profile:   Brittney Bowen is a 61 y.o. female with past medical history of CAD (s/p DES to [place] in 10/2014 in the setting of an NSTEMI), HTN, HLD, and tobacco use who is being seen today for the evaluation of chest pain at the request of Dr. Blaine Hamper.  History of Present Illness:   Ms. Bowen was last examined by Dr. Harrington Challenger on 10/2015 and reported doing well from a cardiac perspective at that time, denying any recent chest pain or dyspnea on exertion. She did report left-sided weakness, therefore a Head CT was obtained and showed no acute findings. She was continued on ASA, Brilinta, Lopressor, and Atorvastatin.   She presented to [facility] ED on 01/14/2017 for evaluation of chest pain. Reports she moved her daughter to college on Thursday and felt weak and fatigued throughout the day but accredited this to moving her daughter's belongings multiple times throughout the day. Starting yesterday evening, she was driving when she developed a tightness along her upper back and neck which radiated down her arms. She reports the symptoms improved with SL NTG but she has continued to feel sore along the area. Pain was not made worse with positional changes. She denies any chest pain or dyspnea on exertion over the past week. Reports the symptoms she experienced yesterday were similar to what she felt in 2016, as she did not have chest pain at that time but did experience significant pain along her shoulders and lower back.   She reports good compliance with her medication regimen including ASA and Brilinta. She continues to smoke but has reduced this from 1 ppd to 0.3 ppd.   Initial labs show WBC of 9.0, Hgb 12.8, platelets 238, K+ 4.1, creatinine 1.12. Initial troponin negative. Unfortunately, repeat  troponin values were not obtained overnight. EKG shows NSR, HR 60, with TWI along Lead III (similar to prior tracings). CXR shows no active cardiopulmonary disease.   Past Medical History:  Diagnosis Date  . Coronary artery disease    a. 10/2014: NSTEMI with DES placed to OM1  . H. pylori infection    treated Sept 2016  . Hyperlipidemia   . Hypertension   . Hypothyroidism   . Nausea & vomiting   . Thyroid disease     Past Surgical History:  Procedure Laterality Date  . CARDIAC CATHETERIZATION N/A 11/04/2014   Procedure: Left Heart Cath and Coronary Angiography;  Surgeon: Jettie Booze, MD;  Location: [place] [facility];  Service: Cardiovascular;  Laterality: N/A;  . CARDIAC CATHETERIZATION Right 11/04/2014   Procedure: Coronary Stent Intervention;  Surgeon: Jettie Booze, MD;  Location: [place] [facility];  Service: Cardiovascular;  Laterality: Right;  . ESOPHAGOGASTRODUODENOSCOPY (EGD) WITH PROPOFOL N/A 02/19/2015   Procedure: ESOPHAGOGASTRODUODENOSCOPY (EGD) WITH PROPOFOL;  Surgeon: Manus Gunning, MD;  Location: [facility];  Service: Gastroenterology;  Laterality: N/A;  . THYROIDECTOMY       Inpatient Medications: Scheduled Meds: . aspirin  81 mg Oral Daily  . atorvastatin  80 mg Oral q1800  . baclofen  20 mg Oral QID  . benzonatate  100 mg Oral Q8H  . carvedilol  6.25 mg Oral BID WC  . gabapentin  300 mg Oral TID  . levothyroxine  100 mcg Oral QAC breakfast  . nicotine  21 mg Transdermal Daily  . ticagrelor  90 mg Oral BID   Continuous Infusions: . sodium chloride 75 mL/hr at 01/15/17 0144  . heparin 1,200 Units/hr (01/15/17 0748)   PRN Meds: acetaminophen, ALPRAZolam, hydrALAZINE, hydrOXYzine, morphine injection, nitroGLYCERIN, zolpidem  Allergies:    Allergies  Allergen Reactions  . Blue Dyes (Parenteral) Anaphylaxis    Blueberries - anaphylaxis   . Zofran [Ondansetron Hcl] Other (See Comments)    Dizziness and High Heart Rate    Social  History:   Social History   Social History  . Marital status: Married    Spouse name: N/A  . Number of children: N/A  . Years of education: N/A   Occupational History  . Not on file.   Social History Main Topics  . Smoking status: Current Every Day Smoker  . Smokeless tobacco: Never Used     Comment: form given 03/17/15  . Alcohol use No  . Drug use: No  . Sexual activity: Not on file   Other Topics Concern  . Not on file   Social History Narrative   Lives with husband in a 2 story home.  Has 3 children.  Currently not working.  Used to work as a Scientist, water quality.      Family History:    Family History  Problem Relation Age of Onset  . Pulmonary embolism Mother   . Heart attack Mother   . CAD Father   . Diabetes Mellitus II Father   . Heart attack Father   . Hypertension Maternal Grandmother   . Stroke Maternal Grandmother   . Esophageal cancer Neg Hx   . Colon cancer Neg Hx   . Pancreatic cancer Neg Hx   . Stomach cancer Neg Hx   . Kidney disease Neg Hx   . Liver disease Neg Hx      ROS:  Please see the history of present illness.  Review of Systems  Constitution: Positive for malaise/fatigue. Negative for chills, decreased appetite and fever.  Cardiovascular: Positive for chest pain. Negative for dyspnea on exertion, irregular heartbeat, leg swelling, near-syncope, orthopnea and palpitations.  Respiratory: Negative for shortness of breath, sputum production and wheezing.   Gastrointestinal: Negative for bloating, constipation, diarrhea, hematemesis, hematochezia, nausea and vomiting.  Neurological: Negative for excessive daytime sleepiness and light-headedness.    All other ROS reviewed and negative.     Physical Exam/Data:   Vitals:   01/14/17 2200 01/14/17 2219 01/14/17 2325 01/15/17 0436  BP: (!) 119/52  (!) 108/46 (!) 110/57  Pulse: (!) 52  (!) 58 (!) 57  Resp: 14  20 18   Temp:   98.3 F (36.8 C) 97.8 F (36.6 C)  TempSrc:   Oral Oral  SpO2:  99%  94% 96%  Weight:  210 lb 9 oz (95.5 kg) 210 lb 6.4 oz (95.4 kg)   Height:  5\' 9"  (1.753 m) 5\' 9"  (1.753 m)     Intake/Output Summary (Last 24 hours) at 01/15/17 0903 Last data filed at 01/14/17 2122  Gross per 24 hour  Intake              500 ml  Output                0 ml  Net              500 ml   Filed Weights   01/14/17 2219 01/14/17 2325  Weight: 210 lb 9 oz (95.5 kg) 210 lb 6.4 oz (95.4  kg)   Body mass index is 31.07 kg/m.  General:  Well nourished, well developed African American female appearing in no acute distress HEENT: normal Lymph: no adenopathy Neck: no JVD Endocrine:  No thryomegaly Vascular: No carotid bruits; FA pulses 2+ bilaterally without bruits  Cardiac:  normal S1, S2; RRR; no murmur  Lungs:  clear to auscultation bilaterally, no wheezing, rhonchi or rales  Abd: soft, nontender, no hepatomegaly  Ext: no edema Musculoskeletal:  No deformities, BUE and BLE strength normal and equal Skin: warm and dry  Neuro:  CNs 2-12 intact, no focal abnormalities noted Psych:  Normal affect   EKG:  The EKG was personally reviewed and demonstrates:   NSR, HR 60, with TWI along Lead III (similar to prior tracings).   Relevant CV Studies:  Cardiac Catheterization: 11/04/2014  1st Mrg lesion, 95% stenosed. A 4.0 x 18 Xience drug-eluting stent was placed. There is a 0% residual stenosis post intervention.   Diagnostic Diagram        Continue dual antiplatelets therapy for at least a year. She will need aggressive secondary prevention including smoking cessation and weight loss. I conveyed all of this to her husband. He is very supportive of her stopping smoking and losing weight. She'll be watched overnight. Would recommend cardiac rehabilitation as well.    Laboratory Data:  Chemistry  Recent Labs Lab 01/14/17 1758  NA 143  K 4.1  CL 111  CO2 22  GLUCOSE 91  BUN 14  CREATININE 1.12*  CALCIUM 8.8*  GFRNONAA 52*  GFRAA >60  ANIONGAP 10    No  results for input(s): PROT, ALBUMIN, AST, ALT, ALKPHOS, BILITOT in the last 168 hours. Hematology  Recent Labs Lab 01/14/17 1758 01/15/17 0525  WBC 9.0 8.2  RBC 4.14 3.83*  HGB 12.8 11.7*  HCT 39.0 36.3  MCV 94.2 94.8  MCH 30.9 30.5  MCHC 32.8 32.2  RDW 14.2 14.4  PLT 238 207   Cardiac EnzymesNo results for input(s): TROPONINI in the last 168 hours.   Recent Labs Lab 01/14/17 1809  TROPIPOC 0.00    BNPNo results for input(s): BNP, PROBNP in the last 168 hours.  DDimer No results for input(s): DDIMER in the last 168 hours.  Radiology/Studies:  Dg Chest 2 View  Result Date: 01/14/2017 CLINICAL DATA:  Initial evaluation for acute upper middle chest pain. EXAM: CHEST  2 VIEW COMPARISON:  Prior radiograph from 11/15/2016. FINDINGS: The cardiac and mediastinal silhouettes are stable in size and contour, and remain within normal limits. The lungs are normally inflated. Minimal linear scarring noted at the left perihilar region, stable. No airspace consolidation, pleural effusion, or pulmonary edema is identified. There is no pneumothorax. No acute osseous abnormality identified.  Scoliosis noted. IMPRESSION: No active cardiopulmonary disease. Electronically Signed   By: Jeannine Boga M.D.   On: 01/14/2017 18:17    Assessment and Plan:   1. Chest Pain with low-risk of cardiac etiology - patient reports developing a tightness along her upper back and neck which radiated down her arms while driving yesterday. Symptoms improved with SL NTG but she has continued to feel sore along this area. Pain was not made worse with positional changes. No recent exertional dyspnea or chest pain. While symptoms are overall atypical, she reports they are similar to what she experienced in 2016 at the time of her NSTEMI but are not as severe. -  Initial troponin negative. Unfortunately, repeat troponin values were not obtained overnight, therefore will order repeat STAT  Troponin. EKG shows NSR, HR  60, with TWI along Lead III (similar to prior tracings).  - if repeat troponin is negative, would favor a NST in the setting of her atypical symptoms. If enzymes become elevated, then she would need definitive evaluation with a cardiac catheterization on Monday.  2. CAD - s/p DES to OM1 in 10/2014 in the setting of an NSTEMI. - she continues on DAPT with ASA and Brilinta. As she is > 2 years out from stent placement, could likely discontinue Brilinta per her Primary Cardiologist pending results of ischemic testing this admission.   3. HTN - BP is well-controlled at 110/57 this AM. - continue Coreg 6.25mg  BID. Would not further titrate BB therapy with borderline bradycardia.   4. HLD - remains on Atorvastatin 80mg  daily. Repeat FLP pending.   5. Tobacco use - she continues to smoke but has reduced this from 1 ppd to 0.3 ppd. - congratulated on this with complete cessation advised.   Signed, Erma Heritage, PA-C  01/15/2017 9:03 AM    The patient was seen, examined and discussed with Bernerd Pho, PA-C and I agree with the above.    61 y.o. female with past medical history of CAD (s/p DES to OM1 in 10/2014 in the setting of an NSTEMI), HTN, HLD, and tobacco use who is being seen today for the evaluation of chest pain , the patient is followed by Dr Harrington Challenger, last seen in May 2017. She presented with a neck pain after moving her daughter to college.  The pain was nonexertional, however similar to her last presentation when she had NSTEMI. No worsening DOE or Troponin negative x1, ecg unchanged from prior and normal. Physical exam normal. This is possibly musculoskeletal sec to moving.  We will plan for a nuclear stress test today, discharge if normal, follow up with Dr Harrington Challenger.   Ena Dawley, MD 01/15/2017

## 2017-01-15 NOTE — Plan of Care (Signed)
Problem: Tissue Perfusion: Goal: Risk factors for ineffective tissue perfusion will decrease Outcome: Progressing Remains on room air with oxygen saturation in the 90's   Problem: Nutrition: Goal: Adequate nutrition will be maintained Outcome: Progressing Able to tolerate food after receiving nausea meds

## 2017-01-16 ENCOUNTER — Observation Stay (HOSPITAL_BASED_OUTPATIENT_CLINIC_OR_DEPARTMENT_OTHER): Payer: Medicaid Other

## 2017-01-16 DIAGNOSIS — K900 Celiac disease: Secondary | ICD-10-CM | POA: Diagnosis not present

## 2017-01-16 DIAGNOSIS — R0789 Other chest pain: Secondary | ICD-10-CM | POA: Diagnosis not present

## 2017-01-16 DIAGNOSIS — M25519 Pain in unspecified shoulder: Secondary | ICD-10-CM | POA: Diagnosis not present

## 2017-01-16 DIAGNOSIS — Z7982 Long term (current) use of aspirin: Secondary | ICD-10-CM | POA: Diagnosis not present

## 2017-01-16 DIAGNOSIS — Z982 Presence of cerebrospinal fluid drainage device: Secondary | ICD-10-CM | POA: Diagnosis not present

## 2017-01-16 DIAGNOSIS — I2510 Atherosclerotic heart disease of native coronary artery without angina pectoris: Secondary | ICD-10-CM | POA: Diagnosis present

## 2017-01-16 DIAGNOSIS — Z7901 Long term (current) use of anticoagulants: Secondary | ICD-10-CM | POA: Diagnosis not present

## 2017-01-16 DIAGNOSIS — I10 Essential (primary) hypertension: Secondary | ICD-10-CM | POA: Diagnosis present

## 2017-01-16 DIAGNOSIS — I340 Nonrheumatic mitral (valve) insufficiency: Secondary | ICD-10-CM

## 2017-01-16 DIAGNOSIS — E785 Hyperlipidemia, unspecified: Secondary | ICD-10-CM | POA: Diagnosis present

## 2017-01-16 DIAGNOSIS — R072 Precordial pain: Secondary | ICD-10-CM | POA: Diagnosis not present

## 2017-01-16 DIAGNOSIS — K5900 Constipation, unspecified: Secondary | ICD-10-CM | POA: Diagnosis present

## 2017-01-16 DIAGNOSIS — E782 Mixed hyperlipidemia: Secondary | ICD-10-CM | POA: Diagnosis not present

## 2017-01-16 DIAGNOSIS — Z9861 Coronary angioplasty status: Secondary | ICD-10-CM | POA: Diagnosis not present

## 2017-01-16 DIAGNOSIS — R001 Bradycardia, unspecified: Secondary | ICD-10-CM | POA: Diagnosis present

## 2017-01-16 DIAGNOSIS — E039 Hypothyroidism, unspecified: Secondary | ICD-10-CM | POA: Diagnosis not present

## 2017-01-16 DIAGNOSIS — E860 Dehydration: Secondary | ICD-10-CM | POA: Diagnosis present

## 2017-01-16 DIAGNOSIS — N179 Acute kidney failure, unspecified: Secondary | ICD-10-CM | POA: Diagnosis present

## 2017-01-16 DIAGNOSIS — R110 Nausea: Secondary | ICD-10-CM | POA: Diagnosis not present

## 2017-01-16 DIAGNOSIS — Z888 Allergy status to other drugs, medicaments and biological substances status: Secondary | ICD-10-CM | POA: Diagnosis not present

## 2017-01-16 DIAGNOSIS — Z79899 Other long term (current) drug therapy: Secondary | ICD-10-CM | POA: Diagnosis not present

## 2017-01-16 DIAGNOSIS — K219 Gastro-esophageal reflux disease without esophagitis: Secondary | ICD-10-CM | POA: Diagnosis present

## 2017-01-16 DIAGNOSIS — F17200 Nicotine dependence, unspecified, uncomplicated: Secondary | ICD-10-CM | POA: Diagnosis present

## 2017-01-16 DIAGNOSIS — E890 Postprocedural hypothyroidism: Secondary | ICD-10-CM | POA: Diagnosis present

## 2017-01-16 DIAGNOSIS — Z955 Presence of coronary angioplasty implant and graft: Secondary | ICD-10-CM | POA: Diagnosis not present

## 2017-01-16 LAB — CBC
HEMATOCRIT: 35.3 % — AB (ref 36.0–46.0)
Hemoglobin: 11.1 g/dL — ABNORMAL LOW (ref 12.0–15.0)
MCH: 30.7 pg (ref 26.0–34.0)
MCHC: 31.4 g/dL (ref 30.0–36.0)
MCV: 97.5 fL (ref 78.0–100.0)
Platelets: 203 10*3/uL (ref 150–400)
RBC: 3.62 MIL/uL — ABNORMAL LOW (ref 3.87–5.11)
RDW: 14.8 % (ref 11.5–15.5)
WBC: 7.2 10*3/uL (ref 4.0–10.5)

## 2017-01-16 LAB — ECHOCARDIOGRAM COMPLETE
CHL CUP DOP CALC LVOT VTI: 22.8 cm
CHL CUP RV SYS PRESS: 19 mmHg
CHL CUP TV REG PEAK VELOCITY: 202 cm/s
E decel time: 165 msec
E/e' ratio: 5.27
FS: 35 % (ref 28–44)
HEIGHTINCHES: 69 in
IV/PV OW: 0.84
LA ID, A-P, ES: 37 mm
LA diam index: 1.75 cm/m2
LA vol A4C: 73.2 ml
LA vol index: 35.6 mL/m2
LA vol: 75.4 mL
LEFT ATRIUM END SYS DIAM: 37 mm
LV PW d: 12.2 mm — AB (ref 0.6–1.1)
LV TDI E'LATERAL: 13.1
LV e' LATERAL: 13.1 cm/s
LVEEAVG: 5.27
LVEEMED: 5.27
LVOT area: 3.46 cm2
LVOTD: 21 mm
LVOTPV: 107 cm/s
LVOTSV: 79 mL
MV Dec: 165
MV pk A vel: 34.3 m/s
MV pk E vel: 69 m/s
RV LATERAL S' VELOCITY: 11.6 cm/s
TAPSE: 22.6 mm
TDI e' medial: 6.31
TRMAXVEL: 202 cm/s
Weight: 3382.4 oz

## 2017-01-16 LAB — HEPARIN LEVEL (UNFRACTIONATED): Heparin Unfractionated: 0.3 IU/mL (ref 0.30–0.70)

## 2017-01-16 NOTE — Progress Notes (Signed)
PROGRESS NOTE    Brittney Bowen  LNL:[id number] DOB: [birth year] DOA: 01/14/2017 PCP: [name], MD   Brief Narrative: 61 y.o.femalewith medical history significant of hypertension, hyperlipidemia, CAD, myocardial infarction, stent placement, tobacco abuse, hypothyroidism, who presents with chest pain.  Patient states that her chest pain started at about 5:30 while she was driving car. Chest pain is located in the right upper chest, sharp, 10 out of 10 in severity, radiating to posterior neck and bilateral arms,causing both arm tingling. Patient does not have SOB,cough, fever or chills. She states that she traveled to and from [place] by car recently. Patient states that she has chronic mild lower abdominal pain, but no abdominal pain today. She had diarrhea more than 10 times yesterday, which has resolved. She has nausea, but no vomiting or diarrhea currently. Pt was treated with NTG and ASA 325 in ED, her chest pain subsided, but still has behind her shoulder.she had a stress test today.cardiac enzymes are normal.appreciate cardiology input.stress test low risk.echo pending.    Assessment & Plan:   Principal Problem:   Chest pain Active Problems:   CAD S/P OM1 DES 11/04/14   Smoker   Hypothyroidism   Hypertension   Hyperlipidemia   AKI (acute kidney injury) ([place])  Chest pain sounds atypical.but she feels this pain is similar to the pain she had when she had a cardiac event.cardiology following.stress test low risk.echo pending.brillinta dcd.will dc heparin too.  Hypertension stable on lopreesor . hypothyroidsm continue synthroid.  Hyperlipidemia on lipitor.  DVT prophylaxis: heparin   DVT prophylaxis: heparin Code Status: full Family Communication:  Disposition Plan:home soon   Consultants: cardiology  Procedures: none  Antimicrobials: none   Subjective: Slight pain shoulder  Objective: Vitals:   01/15/17 1928 01/16/17 0016 01/16/17 0451 01/16/17  0812  BP: (!) 93/50 (!) 95/59 (!) 100/48 112/63  Pulse: (!) 50 (!) 56 (!) 50 (!) 57  Resp: 18 18 18 19   Temp: 97.8 F (36.6 C) 98.5 F (36.9 C) 98.2 F (36.8 C)   TempSrc: Oral Oral Oral   SpO2: 94% 97% 96% 97%  Weight:   95.9 kg (211 lb 6.4 oz)   Height:        Intake/Output Summary (Last 24 hours) at 01/16/17 1314 Last data filed at 01/16/17 1036  Gross per 24 hour  Intake              924 ml  Output              500 ml  Net              424 ml   Filed Weights   01/14/17 2219 01/14/17 2325 01/16/17 0451  Weight: 95.5 kg (210 lb 9 oz) 95.4 kg (210 lb 6.4 oz) 95.9 kg (211 lb 6.4 oz)    Examination:  General exam: Appears calm and comfortable  Respiratory system: Clear to auscultation. Respiratory effort normal. Cardiovascular system: S1 & S2 heard, RRR. No JVD, murmurs, rubs, gallops or clicks. No pedal edema. Gastrointestinal system: Abdomen is nondistended, soft and nontender. No organomegaly or masses felt. Normal bowel sounds heard. Central nervous system: Alert and oriented. No focal neurological deficits. Extremities: Symmetric 5 x 5 power. Skin: No rashes, lesions or ulcers Psychiatry: Judgement and insight appear normal. Mood & affect appropriate.     Data Reviewed: I have personally reviewed following labs and imaging studies  CBC:  Recent Labs Lab 01/14/17 1758 01/15/17 0525 01/16/17 0433  WBC 9.0 8.2 7.2  HGB 12.8 11.7* 11.1*  HCT 39.0 36.3 35.3*  MCV 94.2 94.8 97.5  PLT 238 207 937   Basic Metabolic Panel:  Recent Labs Lab 01/14/17 1758  NA 143  K 4.1  CL 111  CO2 22  GLUCOSE 91  BUN 14  CREATININE 1.12*  CALCIUM 8.8*   GFR: Estimated Creatinine Clearance: 65.9 mL/min (A) (by C-G formula based on SCr of 1.12 mg/dL (H)). Liver Function Tests: No results for input(s): AST, ALT, ALKPHOS, BILITOT, PROT, ALBUMIN in the last 168 hours. No results for input(s): LIPASE, AMYLASE in the last 168 hours. No results for input(s): AMMONIA in the  last 168 hours. Coagulation Profile: No results for input(s): INR, PROTIME in the last 168 hours. Cardiac Enzymes:  Recent Labs Lab 01/15/17 0928  TROPONINI <0.03   BNP (last 3 results) No results for input(s): PROBNP in the last 8760 hours. HbA1C: No results for input(s): HGBA1C in the last 72 hours. CBG: No results for input(s): GLUCAP in the last 168 hours. Lipid Profile: No results for input(s): CHOL, HDL, LDLCALC, TRIG, CHOLHDL, LDLDIRECT in the last 72 hours. Thyroid Function Tests: No results for input(s): TSH, T4TOTAL, FREET4, T3FREE, THYROIDAB in the last 72 hours. Anemia Panel: No results for input(s): VITAMINB12, FOLATE, FERRITIN, TIBC, IRON, RETICCTPCT in the last 72 hours. Sepsis Labs: No results for input(s): PROCALCITON, LATICACIDVEN in the last 168 hours.  No results found for this or any previous visit (from the past 240 hour(s)).       Radiology Studies: Dg Chest 2 View  Result Date: 01/14/2017 CLINICAL DATA:  Initial evaluation for acute upper middle chest pain. EXAM: CHEST  2 VIEW COMPARISON:  Prior radiograph from 11/15/2016. FINDINGS: The cardiac and mediastinal silhouettes are stable in size and contour, and remain within normal limits. The lungs are normally inflated. Minimal linear scarring noted at the left perihilar region, stable. No airspace consolidation, pleural effusion, or pulmonary edema is identified. There is no pneumothorax. No acute osseous abnormality identified.  Scoliosis noted. IMPRESSION: No active cardiopulmonary disease. Electronically Signed   By: Jeannine Boga M.D.   On: 01/14/2017 18:17   Nm Myocar Multi W/spect W/wall Motion / Ef  Result Date: 01/15/2017 CLINICAL DATA:  Chest pain, hypertension, coronary artery disease, elevated triglycerides EXAM: MYOCARDIAL IMAGING WITH SPECT (REST AND PHARMACOLOGIC-STRESS) GATED LEFT VENTRICULAR WALL MOTION STUDY LEFT VENTRICULAR EJECTION FRACTION TECHNIQUE: Standard myocardial SPECT  imaging was performed after resting intravenous injection of 10 mCi Tc-40m tetrofosmin. Subsequently, intravenous infusion of Lexiscan was performed under the supervision of the Cardiology staff. At peak effect of the drug, 30 mCi Tc-58m tetrofosmin was injected intravenously and standard myocardial SPECT imaging was performed. Quantitative gated imaging was also performed to evaluate left ventricular wall motion, and estimate left ventricular ejection fraction. COMPARISON:  None FINDINGS: Perfusion: Moderate-sized perfusion defects identified at the inferolateral wall LEFT ventricle. Majority of defect remains unchanged on resting exam with minimal reperfusion questioned at the lateral margin. Wall Motion: Normal left ventricular wall motion. No left ventricular dilation. Left Ventricular Ejection Fraction: 59 % End diastolic volume 99 ml End systolic volume 40 ml IMPRESSION: 1. Predominant non reversible zone of decreased myocardial perfusion at the inferolateral wall LEFT ventricle with question minimal reperfusion at the lateral margin. 2. Normal left ventricular wall motion. 3. Left ventricular ejection fraction 59% 4. Non invasive risk stratification*: Low *2012 Appropriate Use Criteria for Coronary Revascularization Focused Update: J Am Coll Cardiol. 9024;09(7):353-299. http://content.[email or web address]?[email or web address] Electronically Signed   By: Lavonia Dana  M.D.   On: 01/15/2017 14:57        Scheduled Meds: . aspirin  81 mg Oral Daily  . atorvastatin  80 mg Oral q1800  . baclofen  20 mg Oral QID  . benzonatate  100 mg Oral Q8H  . carvedilol  6.25 mg Oral BID WC  . gabapentin  300 mg Oral TID  . levothyroxine  100 mcg Oral QAC breakfast  . nicotine  21 mg Transdermal Daily  . regadenoson  0.4 mg Intravenous Once   Continuous Infusions:   LOS: 0 days    Time spent:     Georgette Shell, MD [facility] Pager [phone]  If 7PM-7AM, please contact  night-coverage [email or web address] Password [email or web address] 01/16/2017, 1:14 PM

## 2017-01-16 NOTE — Progress Notes (Signed)
  Echocardiogram 2D Echocardiogram has been performed.  Jennette Dubin 01/16/2017, 12:04 PM

## 2017-01-16 NOTE — Progress Notes (Signed)
Progress Note  Patient Name: Brittney Bowen Date of Encounter: 01/16/2017  Primary Cardiologist:   Dr. Harrington Challenger  Subjective   She is having some back pain.  However, no chest pain.  No SOB.   Inpatient Medications    Scheduled Meds: . aspirin  81 mg Oral Daily  . atorvastatin  80 mg Oral q1800  . baclofen  20 mg Oral QID  . benzonatate  100 mg Oral Q8H  . carvedilol  6.25 mg Oral BID WC  . gabapentin  300 mg Oral TID  . levothyroxine  100 mcg Oral QAC breakfast  . nicotine  21 mg Transdermal Daily  . regadenoson  0.4 mg Intravenous Once  . ticagrelor  90 mg Oral BID   Continuous Infusions: . heparin 1,200 Units/hr (01/16/17 1103)   PRN Meds: acetaminophen, ALPRAZolam, hydrALAZINE, HYDROmorphone (DILAUDID) injection, hydrOXYzine, nitroGLYCERIN, promethazine, zolpidem   Vital Signs    Vitals:   01/15/17 1928 01/16/17 0016 01/16/17 0451 01/16/17 0812  BP: (!) 93/50 (!) 95/59 (!) 100/48 112/63  Pulse: (!) 50 (!) 56 (!) 50 (!) 57  Resp: 18 18 18 19   Temp: 97.8 F (36.6 C) 98.5 F (36.9 C) 98.2 F (36.8 C)   TempSrc: Oral Oral Oral   SpO2: 94% 97% 96% 97%  Weight:   211 lb 6.4 oz (95.9 kg)   Height:        Intake/Output Summary (Last 24 hours) at 01/16/17 1205 Last data filed at 01/16/17 1036  Gross per 24 hour  Intake              924 ml  Output              500 ml  Net              424 ml   Filed Weights   01/14/17 2219 01/14/17 2325 01/16/17 0451  Weight: 210 lb 9 oz (95.5 kg) 210 lb 6.4 oz (95.4 kg) 211 lb 6.4 oz (95.9 kg)    Telemetry    NSR, sinus bradycardia - Personally Reviewed  ECG    NA - Personally Reviewed  Physical Exam   GEN: No acute distress.   Neck: No  JVD Cardiac: RRR, no murmurs, rubs, or gallops.  Respiratory: Clear  to auscultation bilaterally. GI: Soft, nontender, non-distended  MS: No  edema; No deformity. Neuro:  Nonfocal  Psych: Normal affect   Labs    Chemistry Recent Labs Lab 01/14/17 1758  NA 143  K 4.1    CL 111  CO2 22  GLUCOSE 91  BUN 14  CREATININE 1.12*  CALCIUM 8.8*  GFRNONAA 52*  GFRAA >60  ANIONGAP 10     Hematology Recent Labs Lab 01/14/17 1758 01/15/17 0525 01/16/17 0433  WBC 9.0 8.2 7.2  RBC 4.14 3.83* 3.62*  HGB 12.8 11.7* 11.1*  HCT 39.0 36.3 35.3*  MCV 94.2 94.8 97.5  MCH 30.9 30.5 30.7  MCHC 32.8 32.2 31.4  RDW 14.2 14.4 14.8  PLT 238 207 203    Cardiac Enzymes Recent Labs Lab 01/15/17 0928  TROPONINI <0.03    Recent Labs Lab 01/14/17 1809  TROPIPOC 0.00     BNPNo results for input(s): BNP, PROBNP in the last 168 hours.   DDimer No results for input(s): DDIMER in the last 168 hours.   Radiology    Dg Chest 2 View  Result Date: 01/14/2017 CLINICAL DATA:  Initial evaluation for acute upper middle chest pain. EXAM: CHEST  2  VIEW COMPARISON:  Prior radiograph from 11/15/2016. FINDINGS: The cardiac and mediastinal silhouettes are stable in size and contour, and remain within normal limits. The lungs are normally inflated. Minimal linear scarring noted at the left perihilar region, stable. No airspace consolidation, pleural effusion, or pulmonary edema is identified. There is no pneumothorax. No acute osseous abnormality identified.  Scoliosis noted. IMPRESSION: No active cardiopulmonary disease. Electronically Signed   By: Jeannine Boga M.D.   On: 01/14/2017 18:17   Nm Myocar Multi W/spect W/wall Motion / Ef  Result Date: 01/15/2017 CLINICAL DATA:  Chest pain, hypertension, coronary artery disease, elevated triglycerides EXAM: MYOCARDIAL IMAGING WITH SPECT (REST AND PHARMACOLOGIC-STRESS) GATED LEFT VENTRICULAR WALL MOTION STUDY LEFT VENTRICULAR EJECTION FRACTION TECHNIQUE: Standard myocardial SPECT imaging was performed after resting intravenous injection of 10 mCi Tc-4m tetrofosmin. Subsequently, intravenous infusion of Lexiscan was performed under the supervision of the Cardiology staff. At peak effect of the drug, 30 mCi Tc-21m tetrofosmin was  injected intravenously and standard myocardial SPECT imaging was performed. Quantitative gated imaging was also performed to evaluate left ventricular wall motion, and estimate left ventricular ejection fraction. COMPARISON:  None FINDINGS: Perfusion: Moderate-sized perfusion defects identified at the inferolateral wall LEFT ventricle. Majority of defect remains unchanged on resting exam with minimal reperfusion questioned at the lateral margin. Wall Motion: Normal left ventricular wall motion. No left ventricular dilation. Left Ventricular Ejection Fraction: 59 % End diastolic volume 99 ml End systolic volume 40 ml IMPRESSION: 1. Predominant non reversible zone of decreased myocardial perfusion at the inferolateral wall LEFT ventricle with question minimal reperfusion at the lateral margin. 2. Normal left ventricular wall motion. 3. Left ventricular ejection fraction 59% 4. Non invasive risk stratification*: Low *2012 Appropriate Use Criteria for Coronary Revascularization Focused Update: J Am Coll Cardiol. 9562;13(0):865-784. http://content.[email or web address]?[email or web address] Electronically Signed   By: Lavonia Dana M.D.   On: 01/15/2017 14:57    Cardiac Studies   Lexiscan Myoview  01/15/17  1. Predominant non reversible zone of decreased myocardial perfusion at the inferolateral wall LEFT ventricle with question minimal reperfusion at the lateral margin.  2. Normal left ventricular wall motion.  3. Left ventricular ejection fraction 59%  4. Non invasive risk stratification*: Low  Patient Profile     61 y.o. female with medical history significant of hypertension, hyperlipidemia, CAD, myocardial infarction, stent placement, tobacco abuse, hypothyroidism, who presents with chest pain.  Assessment & Plan    CHEST PAIN:  Low risk scan as above.  Echo done and results pending.  She can stop the Brilinta as she is greater than two years out from her stent.  No further work up (pending  the results of the echo).  HTN:  BP is normal.  No change in therapy.   DYSLIPIDEMIA:   I don't see that this has been resulted although it was ordered.   TOBACCO ABUSE:  Educated.   Signed, Minus Breeding, MD  01/16/2017, 12:05 PM

## 2017-01-16 NOTE — Progress Notes (Signed)
ANTICOAGULATION CONSULT NOTE - Follow Up Consult  Pharmacy Consult for Heparin Indication: chest pain/ACS  Allergies  Allergen Reactions  . Blue Dyes (Parenteral) Anaphylaxis    Blueberries - anaphylaxis   . Zofran [Ondansetron Hcl] Other (See Comments)    Dizziness and High Heart Rate    Patient Measurements: Height: 5\' 9"  (175.3 cm) Weight: 211 lb 6.4 oz (95.9 kg) IBW/kg (Calculated) : 66.2 Heparin Dosing Weight: 75kg  Vital Signs: Temp: 98.2 F (36.8 C) (08/13 0451) Temp Source: Oral (08/13 0451) BP: 112/63 (08/13 0812) Pulse Rate: 57 (08/13 0812)  Labs:  Recent Labs  01/14/17 1758 01/15/17 0525 01/15/17 0556 01/15/17 0928 01/15/17 1511 01/16/17 0433  HGB 12.8 11.7*  --   --   --  11.1*  HCT 39.0 36.3  --   --   --  35.3*  PLT 238 207  --   --   --  203  HEPARINUNFRC  --   --  0.17*  --  0.39 0.30  CREATININE 1.12*  --   --   --   --   --   TROPONINI  --   --   --  <0.03  --   --     Estimated Creatinine Clearance: 65.9 mL/min (A) (by C-G formula based on SCr of 1.12 mg/dL (H)).   Medications:  Heparin @ 1200 units/hr  Assessment: 61yof on IV heparin 1200 units/hr for unstable angina, s/p stress test yesterday. Heparin level remains therapeutic at 0.30.   CBC stable. No bleeding reported.   Goal of Therapy:  Heparin level 0.3-0.7 units/ml Monitor platelets by anticoagulation protocol: Yes   Plan:  1) Continue heparin at 1200 units/hr 2) Follow up daily heparin level and CBC  Nicole Cella, RPh Clinical Pharmacist Pager: [phone] 01/16/2017,10:35 AM

## 2017-01-17 DIAGNOSIS — R072 Precordial pain: Secondary | ICD-10-CM

## 2017-01-17 LAB — CBC
HCT: 38 % (ref 36.0–46.0)
Hemoglobin: 12 g/dL (ref 12.0–15.0)
MCH: 30.3 pg (ref 26.0–34.0)
MCHC: 31.6 g/dL (ref 30.0–36.0)
MCV: 96 fL (ref 78.0–100.0)
PLATELETS: 211 10*3/uL (ref 150–400)
RBC: 3.96 MIL/uL (ref 3.87–5.11)
RDW: 14.5 % (ref 11.5–15.5)
WBC: 6.7 10*3/uL (ref 4.0–10.5)

## 2017-01-17 LAB — HEPARIN LEVEL (UNFRACTIONATED)

## 2017-01-17 MED ORDER — MAGNESIUM HYDROXIDE 400 MG/5ML PO SUSP
30.0000 mL | Freq: Every day | ORAL | Status: DC | PRN
  Administered 2017-01-17: 30 mL via ORAL
  Filled 2017-01-17: qty 30

## 2017-01-17 MED ORDER — SODIUM CHLORIDE 0.9 % IV SOLN
INTRAVENOUS | Status: DC
  Administered 2017-01-17 – 2017-01-18 (×3): via INTRAVENOUS

## 2017-01-17 MED ORDER — BISACODYL 5 MG PO TBEC
10.0000 mg | DELAYED_RELEASE_TABLET | Freq: Once | ORAL | Status: AC
  Administered 2017-01-17: 10 mg via ORAL
  Filled 2017-01-17: qty 2

## 2017-01-17 NOTE — Progress Notes (Signed)
PROGRESS NOTE    Brittney Bowen  AUQ:[id number] DOB: 09/27/1955 DOA: 01/14/2017 PCP: [name], MD    Brief Narrative:  61 y.o.femalewith medical history significant of hypertension, hyperlipidemia, CAD, myocardial infarction, stent placement, tobacco abuse, hypothyroidism, who presents with chest pain.  Patient states that her chest pain started at about 5:30 while she was driving car. Chest pain is located in the right upper chest, sharp, 10 out of 10 in severity, radiating to posterior neck and bilateral arms,causing both arm tingling. Patient does not have SOB,cough, fever or chills. She states that she traveled to and from [place] by car recently.Patient states that she has chronic mild lower abdominal pain, but no abdominal pain today. She had diarrhea more than 10 times yesterday, which has resolved. She has nausea, but no vomiting or diarrhea currently. Pt was treated with NTG and ASA 325in ED, her chest pain subsided, but still has behind her shoulder.she had a stress test today.cardiac enzymes are normal.appreciate cardiology input.stress test low risk.echo pending.   Assessment & Plan:   Principal Problem:   Chest pain Active Problems:   CAD S/P OM1 DES 11/04/14   Smoker   Hypothyroidism   Hypertension   Hyperlipidemia   AKI (acute kidney injury) ([place])    Chest pain sounds atypical.but she feels this pain is similar to the pain she had when she had a cardiac event.cardiology following.stress test low risk.echo 45% to 50%..brillinta dcd.will dc heparin too.  Hypertension stable on lopreesor . hypothyroidsm continue synthroid.  Hyperlipidemia on lipitor.  Constipation stool softners.  Nausea secondary to narcotics?,constipation.patient with decreased po intake.will give a bag of ivf.plan dc in am.    DVT prophylaxis: heparin Code Status: full Family Communication:  Disposition Plan: home   Consultants:  cardiology Procedures: echo and stress  test  Antimicrobials:    Subjective:  feels nauseous, light headed  And constipated.  Objective: Vitals:   01/16/17 1505 01/16/17 1934 01/17/17 0549 01/17/17 1201  BP: (!) 110/43 (!) 122/58 (!) 102/54 (!) 102/54  Pulse: (!) 52 60 (!) 53 (!) 56  Resp: 18 18 18 20   Temp: 97.9 F (36.6 C) 98 F (36.7 C) 98.5 F (36.9 C) 98.4 F (36.9 C)  TempSrc: Oral Oral Oral Oral  SpO2: 94% 95% 98% 95%  Weight:   95.3 kg (210 lb)   Height:        Intake/Output Summary (Last 24 hours) at 01/17/17 1701 Last data filed at 01/17/17 1533  Gross per 24 hour  Intake              840 ml  Output                0 ml  Net              840 ml   Filed Weights   01/14/17 2325 01/16/17 0451 01/17/17 0549  Weight: 95.4 kg (210 lb 6.4 oz) 95.9 kg (211 lb 6.4 oz) 95.3 kg (210 lb)    Examination:  General exam: Appears calm and comfortable  Respiratory system: Clear to auscultation. Respiratory effort normal. Cardiovascular system: S1 & S2 heard, RRR. No JVD, murmurs, rubs, gallops or clicks. No pedal edema. Gastrointestinal system: Abdomen is nondistended, soft and nontender. No organomegaly or masses felt. Normal bowel sounds heard. Central nervous system: Alert and oriented. No focal neurological deficits. Extremities: Symmetric 5 x 5 power. Skin: No rashes, lesions or ulcers Psychiatry: Judgement and insight appear normal. Mood & affect appropriate.  Data Reviewed: I have personally reviewed following labs and imaging studies  CBC:  Recent Labs Lab 01/14/17 1758 01/15/17 0525 01/16/17 0433 01/17/17 0241  WBC 9.0 8.2 7.2 6.7  HGB 12.8 11.7* 11.1* 12.0  HCT 39.0 36.3 35.3* 38.0  MCV 94.2 94.8 97.5 96.0  PLT 238 207 203 408   Basic Metabolic Panel:  Recent Labs Lab 01/14/17 1758  NA 143  K 4.1  CL 111  CO2 22  GLUCOSE 91  BUN 14  CREATININE 1.12*  CALCIUM 8.8*   GFR: Estimated Creatinine Clearance: 65.6 mL/min (A) (by C-G formula based on SCr of 1.12 mg/dL  (H)). Liver Function Tests: No results for input(s): AST, ALT, ALKPHOS, BILITOT, PROT, ALBUMIN in the last 168 hours. No results for input(s): LIPASE, AMYLASE in the last 168 hours. No results for input(s): AMMONIA in the last 168 hours. Coagulation Profile: No results for input(s): INR, PROTIME in the last 168 hours. Cardiac Enzymes:  Recent Labs Lab 01/15/17 0928  TROPONINI <0.03   BNP (last 3 results) No results for input(s): PROBNP in the last 8760 hours. HbA1C: No results for input(s): HGBA1C in the last 72 hours. CBG: No results for input(s): GLUCAP in the last 168 hours. Lipid Profile: No results for input(s): CHOL, HDL, LDLCALC, TRIG, CHOLHDL, LDLDIRECT in the last 72 hours. Thyroid Function Tests: No results for input(s): TSH, T4TOTAL, FREET4, T3FREE, THYROIDAB in the last 72 hours. Anemia Panel: No results for input(s): VITAMINB12, FOLATE, FERRITIN, TIBC, IRON, RETICCTPCT in the last 72 hours. Sepsis Labs: No results for input(s): PROCALCITON, LATICACIDVEN in the last 168 hours.  No results found for this or any previous visit (from the past 240 hour(s)).       Radiology Studies: No results found.      Scheduled Meds: . aspirin  81 mg Oral Daily  . atorvastatin  80 mg Oral q1800  . baclofen  20 mg Oral QID  . benzonatate  100 mg Oral Q8H  . carvedilol  6.25 mg Oral BID WC  . gabapentin  300 mg Oral TID  . levothyroxine  100 mcg Oral QAC breakfast  . nicotine  21 mg Transdermal Daily  . regadenoson  0.4 mg Intravenous Once   Continuous Infusions: . sodium chloride 150 mL/hr at 01/17/17 1514     LOS: 1 day    Time spent:    Georgette Shell, MD [facility] Pager [phone]  If 7PM-7AM, please contact night-coverage [email or web address] Password [email or web address] 01/17/2017, 5:01 PM

## 2017-01-17 NOTE — Progress Notes (Signed)
Progress Note  Patient Name: Brittney Bowen Date of Encounter: 01/17/2017  Primary Cardiologist:   Dr. Harrington Challenger  Subjective   No chest pain.  She has nausea and is fatigued.   Inpatient Medications    Scheduled Meds: . aspirin  81 mg Oral Daily  . atorvastatin  80 mg Oral q1800  . baclofen  20 mg Oral QID  . benzonatate  100 mg Oral Q8H  . carvedilol  6.25 mg Oral BID WC  . gabapentin  300 mg Oral TID  . levothyroxine  100 mcg Oral QAC breakfast  . nicotine  21 mg Transdermal Daily  . regadenoson  0.4 mg Intravenous Once   Continuous Infusions:  PRN Meds: acetaminophen, ALPRAZolam, hydrALAZINE, HYDROmorphone (DILAUDID) injection, hydrOXYzine, nitroGLYCERIN, promethazine   Vital Signs    Vitals:   01/16/17 0812 01/16/17 1505 01/16/17 1934 01/17/17 0549  BP: 112/63 (!) 110/43 (!) 122/58 (!) 102/54  Pulse: (!) 57 (!) 52 60 (!) 53  Resp: 19 18 18 18   Temp:  97.9 F (36.6 C) 98 F (36.7 C) 98.5 F (36.9 C)  TempSrc:  Oral Oral Oral  SpO2: 97% 94% 95% 98%  Weight:    210 lb (95.3 kg)  Height:        Intake/Output Summary (Last 24 hours) at 01/17/17 0813 Last data filed at 01/16/17 1800  Gross per 24 hour  Intake              630 ml  Output              500 ml  Net              130 ml   Filed Weights   01/14/17 2325 01/16/17 0451 01/17/17 0549  Weight: 210 lb 6.4 oz (95.4 kg) 211 lb 6.4 oz (95.9 kg) 210 lb (95.3 kg)    Telemetry    NSR - Personally Reviewed  ECG    NA - Personally Reviewed  Physical Exam   GEN: No  acute distress.   Neck: No  JVD Cardiac: RRR, no murmurs, rubs, or gallops.  Respiratory: Clear   to auscultation bilaterally. GI: Soft, nontender, non-distended, normal bowel sounds  MS:  No edema; No deformity. Neuro:   Nonfocal  Psych: Oriented and appropriate   Labs    Chemistry  Recent Labs Lab 01/14/17 1758  NA 143  K 4.1  CL 111  CO2 22  GLUCOSE 91  BUN 14  CREATININE 1.12*  CALCIUM 8.8*  GFRNONAA 52*  GFRAA >60   ANIONGAP 10     Hematology  Recent Labs Lab 01/15/17 0525 01/16/17 0433 01/17/17 0241  WBC 8.2 7.2 6.7  RBC 3.83* 3.62* 3.96  HGB 11.7* 11.1* 12.0  HCT 36.3 35.3* 38.0  MCV 94.8 97.5 96.0  MCH 30.5 30.7 30.3  MCHC 32.2 31.4 31.6  RDW 14.4 14.8 14.5  PLT 207 203 211    Cardiac Enzymes  Recent Labs Lab 01/15/17 0928  TROPONINI <0.03     Recent Labs Lab 01/14/17 1809  TROPIPOC 0.00     BNPNo results for input(s): BNP, PROBNP in the last 168 hours.   DDimer No results for input(s): DDIMER in the last 168 hours.   Radiology    Nm Myocar Multi W/spect W/wall Motion / Ef  Result Date: 01/15/2017 CLINICAL DATA:  Chest pain, hypertension, coronary artery disease, elevated triglycerides EXAM: MYOCARDIAL IMAGING WITH SPECT (REST AND PHARMACOLOGIC-STRESS) GATED LEFT VENTRICULAR WALL MOTION STUDY LEFT VENTRICULAR EJECTION  FRACTION TECHNIQUE: Standard myocardial SPECT imaging was performed after resting intravenous injection of 10 mCi Tc-73m tetrofosmin. Subsequently, intravenous infusion of Lexiscan was performed under the supervision of the Cardiology staff. At peak effect of the drug, 30 mCi Tc-47m tetrofosmin was injected intravenously and standard myocardial SPECT imaging was performed. Quantitative gated imaging was also performed to evaluate left ventricular wall motion, and estimate left ventricular ejection fraction. COMPARISON:  None FINDINGS: Perfusion: Moderate-sized perfusion defects identified at the inferolateral wall LEFT ventricle. Majority of defect remains unchanged on resting exam with minimal reperfusion questioned at the lateral margin. Wall Motion: Normal left ventricular wall motion. No left ventricular dilation. Left Ventricular Ejection Fraction: 59 % End diastolic volume 99 ml End systolic volume 40 ml IMPRESSION: 1. Predominant non reversible zone of decreased myocardial perfusion at the inferolateral wall LEFT ventricle with question minimal reperfusion at  the lateral margin. 2. Normal left ventricular wall motion. 3. Left ventricular ejection fraction 59% 4. Non invasive risk stratification*: Low *2012 Appropriate Use Criteria for Coronary Revascularization Focused Update: J Am Coll Cardiol. 2585;27(7):824-235. http://content.[email or web address]?articleid=[email or web address] Electronically Signed   By: Lavonia Dana M.D.   On: 01/15/2017 14:57    Cardiac Studies   Lexiscan Myoview  01/15/17  1. Predominant non reversible zone of decreased myocardial perfusion at the inferolateral wall LEFT ventricle with question minimal reperfusion at the lateral margin.  2. Normal left ventricular wall motion.  3. Left ventricular ejection fraction 59%  4. Non invasive risk stratification*: Low  ECHO  8/13 - Left ventricle: The cavity size was normal. Wall thickness was   normal. Systolic function was mildly reduced. The estimated   ejection fraction was in the range of 45% to 50%. Diffuse   hypokinesis. Doppler parameters are consistent with abnormal left   ventricular relaxation (grade 1 diastolic dysfunction). - Mitral valve: There was mild regurgitation. - Left atrium: The atrium was mildly dilated.  Patient Profile     61 y.o. female with medical history significant of hypertension, hyperlipidemia, CAD, myocardial infarction, stent placement, tobacco abuse, hypothyroidism, who presents with chest pain.  Assessment & Plan    CHEST PAIN:  Low risk scan as above.  Medical management.   I stopped her Brilinta  HTN:  BP is normal.  Continue current therapy.   DYSLIPIDEMIA:    This was ordered but not resulted.  She can have a lipid panel drawn as an out patient   CM:  EF is slightly low on echo.  She had both metoprolol and Coreg on her admit meds.  Now only on Coreg.  BP is low.  Follow as an outpatient to consider titration of meds with ARB.   TOBACCO ABUSE:  Educated.     Signed, Minus Breeding, MD  01/17/2017, 8:13 AM

## 2017-01-18 DIAGNOSIS — E039 Hypothyroidism, unspecified: Secondary | ICD-10-CM

## 2017-01-18 DIAGNOSIS — E785 Hyperlipidemia, unspecified: Secondary | ICD-10-CM

## 2017-01-18 LAB — CBC
HCT: 36.9 % (ref 36.0–46.0)
HEMOGLOBIN: 11.4 g/dL — AB (ref 12.0–15.0)
MCH: 29.9 pg (ref 26.0–34.0)
MCHC: 30.9 g/dL (ref 30.0–36.0)
MCV: 96.9 fL (ref 78.0–100.0)
Platelets: 185 10*3/uL (ref 150–400)
RBC: 3.81 MIL/uL — AB (ref 3.87–5.11)
RDW: 14.3 % (ref 11.5–15.5)
WBC: 7.1 10*3/uL (ref 4.0–10.5)

## 2017-01-18 MED ORDER — ALUM & MAG HYDROXIDE-SIMETH 200-200-20 MG/5ML PO SUSP: 15.0000 mL | ORAL | Status: DC | PRN

## 2017-01-18 MED ORDER — POLYETHYLENE GLYCOL 3350 17 G PO PACK: 17.0000 g | PACK | Freq: Every day | ORAL | 0 refills | Status: AC

## 2017-01-18 MED ORDER — LEVOTHYROXINE SODIUM 100 MCG PO TABS
100.0000 ug | ORAL_TABLET | Freq: Every day | ORAL | Status: DC
  Administered 2017-01-18: 100 ug via ORAL
  Filled 2017-01-18: qty 1

## 2017-01-18 MED ORDER — PANTOPRAZOLE SODIUM 40 MG PO TBEC: 40.0000 mg | DELAYED_RELEASE_TABLET | Freq: Every day | ORAL | 0 refills | Status: AC

## 2017-01-18 MED ORDER — FLEET ENEMA 7-19 GM/118ML RE ENEM: 1.0000 | ENEMA | Freq: Once | RECTAL | Status: DC

## 2017-01-18 MED ORDER — POLYETHYLENE GLYCOL 3350 17 G PO PACK
17.0000 g | PACK | Freq: Every day | ORAL | Status: DC
  Administered 2017-01-18: 17 g via ORAL
  Filled 2017-01-18: qty 1

## 2017-01-18 MED ORDER — PANTOPRAZOLE SODIUM 40 MG PO TBEC
40.0000 mg | DELAYED_RELEASE_TABLET | Freq: Every day | ORAL | Status: DC
Start: 2017-01-18 — End: 2017-01-18
  Administered 2017-01-18: 40 mg via ORAL
  Filled 2017-01-18: qty 1

## 2017-01-18 MED ORDER — HEPARIN SODIUM (PORCINE) 5000 UNIT/ML IJ SOLN: 5000.0000 [IU] | Freq: Three times a day (TID) | INTRAMUSCULAR | Status: DC

## 2017-01-18 NOTE — Progress Notes (Signed)
Pt refused wheelchair. Ambulated off floor accompanied by her friend, without difficulty. No complaints.

## 2017-01-18 NOTE — Progress Notes (Signed)
Call placed to [facility] to notify of telemetry monitoring d/c.   

## 2017-01-18 NOTE — Evaluation (Signed)
Physical Therapy Evaluation Patient Details Name: Brittney Bowen MRN: [id number] DOB: [birth year] Today's Date: 01/18/2017   History of Present Illness   Brittney Bowen is a 61 y.o. female with medical history significant of hypertension, hyperlipidemia, CAD, myocardial infarction, stent placement, tobacco abuse, hypothyroidism, who presents with chest pain.  Clinical Impression  Pt is at or close to baseline functioning and should be safe at home . There are no further acute PT needs.  Will sign off at this time.     Follow Up Recommendations No PT follow up    Equipment Recommendations  None recommended by PT    Recommendations for Other Services       Precautions / Restrictions Precautions Precautions: Fall Precaution Comments: I'm dizzy.        Mobility  Bed Mobility               General bed mobility comments: NT,  OOB  Transfers Overall transfer level: Modified independent Equipment used: None                Ambulation/Gait Ambulation/Gait assistance: Supervision Ambulation Distance (Feet): 150 Feet Assistive device: None (occasional use of the rail) Gait Pattern/deviations: Step-through pattern   Gait velocity interpretation: Below normal speed for age/gender General Gait Details: generally steady, but with some drift due to dizziness.  Stairs Stairs: Yes   Stair Management: One rail Right;Alternating pattern;Step to pattern;Forwards Number of Stairs: 5 General stair comments: safe, uses step to coming down  Wheelchair Mobility    Modified Rankin (Stroke Patients Only)       Balance Overall balance assessment: Needs assistance   Sitting balance-Leahy Scale: Normal       Standing balance-Leahy Scale: Good                               Pertinent Vitals/Pain Pain Assessment: No/denies pain    Home Living Family/patient expects to be discharged to:: Private residence Living Arrangements: Spouse/significant  other Available Help at Discharge: Family;Friend(s);Available 24 hours/day Type of Home: House Home Access: Level entry     Home Layout: Two level;Bed/bath upstairs Home Equipment: Cane - single point      Prior Function Level of Independence: Independent with assistive device(s)               Hand Dominance        Extremity/Trunk Assessment   Upper Extremity Assessment Upper Extremity Assessment: Overall WFL for tasks assessed    Lower Extremity Assessment Lower Extremity Assessment: Overall WFL for tasks assessed;LLE deficits/detail LLE Deficits / Details: left notably weaker than right max 4/5       Communication   Communication: No difficulties  Cognition Arousal/Alertness: Awake/alert Behavior During Therapy: WFL for tasks assessed/performed Overall Cognitive Status: Within Functional Limits for tasks assessed                                        General Comments      Exercises     Assessment/Plan    PT Assessment Patent does not need any further PT services  PT Problem List         PT Treatment Interventions      PT Goals (Current goals can be found in the Care Plan section)  Acute Rehab PT Goals PT Goal Formulation: All assessment and education complete, DC  therapy    Frequency     Barriers to discharge        Co-evaluation               AM-PAC PT "6 Clicks" Daily Activity  Outcome Measure Difficulty turning over in bed (including adjusting bedclothes, sheets and blankets)?: A Little Difficulty moving from lying on back to sitting on the side of the bed? : A Little Difficulty sitting down on and standing up from a chair with arms (e.g., wheelchair, bedside commode, etc,.)?: A Little Help needed moving to and from a bed to chair (including a wheelchair)?: None Help needed walking in hospital room?: A Little Help needed climbing 3-5 steps with a railing? : A Little 6 Click Score: 19    End of Session      Patient left: in chair;with call bell/phone within reach Nurse Communication: Mobility status PT Visit Diagnosis: Unsteadiness on feet (R26.81)    Time: 0947-0962 PT Time Calculation (min) (ACUTE ONLY): 16 min   Charges:   PT Evaluation $PT Eval Low Complexity: 1 Low     PT G Codes:        2017-02-11  Brittney Bowen, PT [phone]  (pager)  Brittney Bowen 02-11-17, 4:36 PM

## 2017-01-18 NOTE — Care Management Note (Signed)
Case Management Note  Patient Details  Name: TASNIM BALENTINE MRN: [id number] Date of Birth: 11/29/1955  Subjective/Objective:     Chest pain, HTN, Hyperlipidemia               Action/Plan: Discharge Planning: Chart reviewed. NCM spoke to pt and able to afford medication. No NCM needs identified.   PCP [name] MD  Expected Discharge Date:  01/18/2017               Expected Discharge Plan:  Home/Self Care  In-House Referral:  NA  Discharge planning Services  CM Consult  Post Acute Care Choice:  NA Choice offered to:  NA  DME Arranged:  N/A DME Agency:  NA  HH Arranged:  NA HH Agency:  NA  Status of Service:  Completed, signed off  If discussed at [place] of Stay Meetings, dates discussed:    Additional Comments:  Erenest Rasher, RN 01/18/2017, 11:45 AM

## 2017-01-18 NOTE — Progress Notes (Signed)
Progress Note  Patient Name: Brittney Bowen Date of Encounter: 01/18/2017  Primary Cardiologist: Dr. Harrington Challenger  Subjective   No chest pain; mild nausea and decreased BM  Inpatient Medications    Scheduled Meds: . aspirin  81 mg Oral Daily  . atorvastatin  80 mg Oral q1800  . baclofen  20 mg Oral QID  . benzonatate  100 mg Oral Q8H  . carvedilol  6.25 mg Oral BID WC  . gabapentin  300 mg Oral TID  . levothyroxine  100 mcg Oral QAC breakfast  . nicotine  21 mg Transdermal Daily  . regadenoson  0.4 mg Intravenous Once   Continuous Infusions: . sodium chloride 150 mL/hr at 01/17/17 1514   PRN Meds: acetaminophen, ALPRAZolam, hydrALAZINE, HYDROmorphone (DILAUDID) injection, hydrOXYzine, magnesium hydroxide, nitroGLYCERIN, promethazine   Vital Signs    Vitals:   01/17/17 0549 01/17/17 1201 01/17/17 2002 01/18/17 0411  BP: (!) 102/54 (!) 102/54 (!) 107/49 (!) 113/49  Pulse: (!) 53 (!) 56 (!) 56 (!) 50  Resp: 18 20 18 18   Temp: 98.5 F (36.9 C) 98.4 F (36.9 C) 97.6 F (36.4 C) (!) 97.5 F (36.4 C)  TempSrc: Oral Oral Oral Oral  SpO2: 98% 95% 96% 100%  Weight: 210 lb (95.3 kg)   215 lb 3.2 oz (97.6 kg)  Height:        Intake/Output Summary (Last 24 hours) at 01/18/17 1023 Last data filed at 01/18/17 0852  Gross per 24 hour  Intake           3832.5 ml  Output                0 ml  Net           3832.5 ml    I/O since admission:  +5212  Filed Weights   01/16/17 0451 01/17/17 0549 01/18/17 0411  Weight: 211 lb 6.4 oz (95.9 kg) 210 lb (95.3 kg) 215 lb 3.2 oz (97.6 kg)    Telemetry    N/A - Personally Reviewed  ECG    ECG (independently read by me): NSR at 73  Physical Exam   BP (!) 113/49 (BP Location: Right Arm)   Pulse (!) 50   Temp (!) 97.5 F (36.4 C) (Oral)   Resp 18   Ht 5\' 9"  (1.753 m)   Wt 215 lb 3.2 oz (97.6 kg) Comment: scale a  SpO2 100%   BMI 31.78 kg/m  General: Alert, oriented, no distress.  Skin: normal turgor, no rashes, warm and  dry HEENT: Normocephalic, atraumatic. Pupils equal round and reactive to light; sclera anicteric; extraocular muscles intact;  Nose without nasal septal hypertrophy Mouth/Parynx benign; Mallinpatti scale 3 Neck: No JVD, no carotid bruits; normal carotid upstroke Lungs: clear to ausculatation and percussion; no wheezing or rales Chest wall: without tenderness to palpitation Heart: PMI not displaced, RRR, s1 s2 normal, 1/6 systolic murmur, no diastolic murmur, no rubs, gallops, thrills, or heaves Abdomen: Bloated;  nontender; no hepatosplenomehaly, BS+; abdominal aorta nontender and not dilated by palpation. Back: no CVA tenderness Pulses 2+ Musculoskeletal: full range of motion, normal strength, no joint deformities Extremities: no clubbing cyanosis or edema, Homan's sign negative  Neurologic: grossly nonfocal; Cranial nerves grossly wnl Psychologic: Normal mood and affect   Labs    Chemistry Recent Labs Lab 01/14/17 1758  NA 143  K 4.1  CL 111  CO2 22  GLUCOSE 91  BUN 14  CREATININE 1.12*  CALCIUM 8.8*  GFRNONAA 52*  GFRAA >60  ANIONGAP 10     Hematology Recent Labs Lab 01/16/17 0433 01/17/17 0241 01/18/17 0516  WBC 7.2 6.7 7.1  RBC 3.62* 3.96 3.81*  HGB 11.1* 12.0 11.4*  HCT 35.3* 38.0 36.9  MCV 97.5 96.0 96.9  MCH 30.7 30.3 29.9  MCHC 31.4 31.6 30.9  RDW 14.8 14.5 14.3  PLT 203 211 185    Cardiac Enzymes Recent Labs Lab 01/15/17 0928  TROPONINI <0.03    Recent Labs Lab 01/14/17 1809  TROPIPOC 0.00     BNPNo results for input(s): BNP, PROBNP in the last 168 hours.   DDimer No results for input(s): DDIMER in the last 168 hours.   Lipid Panel     Component Value Date/Time   CHOL 103 (L) 10/19/2015 1010   TRIG 135 10/19/2015 1010   HDL 29 (L) 10/19/2015 1010   CHOLHDL 3.6 10/19/2015 1010   VLDL 27 10/19/2015 1010   LDLCALC 47 10/19/2015 1010    Radiology    No results found.  Cardiac Studies    Lexiscan IMPRESSION: 1. Predominant  non reversible zone of decreased myocardial perfusion at the inferolateral wall LEFT ventricle with question minimal reperfusion at the lateral margin.  2. Normal left ventricular wall motion.  3. Left ventricular ejection fraction 59%  4. Non invasive risk stratification*: Low   ------------------------------------------------------------------- 01/16/2017 ECHO Study Conclusions  - Left ventricle: The cavity size was normal. Wall thickness was   normal. Systolic function was mildly reduced. The estimated   ejection fraction was in the range of 45% to 50%. Diffuse   hypokinesis. Doppler parameters are consistent with abnormal left   ventricular relaxation (grade 1 diastolic dysfunction). - Mitral valve: There was mild regurgitation. - Left atrium: The atrium was mildly dilated.  Impressions:  - Mild global reduction in LV systolic function (EF 50); mild   diastolic dysfunction; mild MR and TR; mild LAE.  Patient Profile     61 y.o. female with medical history significant of hypertension, hyperlipidemia, CAD, myocardial infarction, stent placement, tobacco abuse, hypothyroidism, who presents with chest pain.   Assessment & Plan    1. Chest pain: Low risk nuclear; no ischemic sounding symptoms. Attenuation defect on nuclear study.Medical therapy. Continue coreg.   2. HTN; Stable; if additional med is needed with CAD can add amlodipine oin there future.  3. Hyperlipidemia:  LDL 47 in 10/2015;    4. Nausea  Signed, Troy Sine, MD, [facility] 01/18/2017, 10:23 AM

## 2017-01-18 NOTE — Progress Notes (Signed)
[place]. Pt reports small BM, refuses enema, requests to be d/c without further BM progress. If ok, please reconcile orders for d/c.

## 2017-01-19 NOTE — Discharge Summary (Signed)
Physician Discharge Summary  Brittney Bowen FIE:[id number] DOB: Dec 05, 1955 DOA: 01/14/2017  PCP: [name], MD  Admit date: 01/14/2017 Discharge date: 01/18/2017  Admitted From: [facility].  Disposition:  Home.   Recommendations for Outpatient Follow-up:  1. Follow up with PCP in 1-2 weeks 2. Please obtain BMP/CBC in one week Please follow up with cardiology as outpatient, as needed.   Discharge Condition:stable.  CODE STATUS: full code.  Diet recommendation: Heart Healthy  Brief/Interim Summary: 61 y.o.femalewith medical history significant of hypertension, hyperlipidemia, CAD, myocardial infarction, stent placement, tobacco abuse, hypothyroidism, who presents with chest pain. . Pt was treated with NTG and ASA 325in ED, her chest pain subsided, but still has behind her shoulder.cardiac enzymes are normal, cardiology consulted and she underwent stress test ,  Low risk nuclear; no ischemic symptoms. Attenuation defect on nuclear study. Cardiology recommending Medical therapy. Echocardiogram done showed Mild global reduction in LV systolic function (EF 50); mild diastolic dysfunction; mild MR and TR; mild LAE.   Discharge Diagnoses:  Principal Problem:   Chest pain Active Problems:   CAD S/P OM1 DES 11/04/14   Smoker   Hypothyroidism   Hypertension   Hyperlipidemia   Chest pain, possibly atypical in nature, cardiac enzymes negative, ACS ruled out.  Possibly GERD, Started on protonix with some relief.  Cardiology consulted and recommendations given.  Underwent stress which was low risk, no ischemic symptoms.  Echocardiogram shows mild global reduction.  Recommended outpatient follow up after a trial of PPI.    Hypertension  Well controlled.    Hypothyroidism:  Resume synthroid  Hyperlipidemia:  Resume atorvastatin.   Constipation:  Stool softeners and laxative ordered with some relief.   CAD: brillinta stopped by cardiology, resume aspirin, coreg and statin.     Discharge Instructions  Discharge Instructions    Diet - low sodium heart healthy    Complete by:  As directed    Discharge instructions    Complete by:  As directed    Please follow up with PCP in one week.     Allergies as of 01/18/2017      Reactions   Blue Dyes (parenteral) Anaphylaxis   Blueberries - anaphylaxis    Zofran [ondansetron Hcl] Other (See Comments)   Dizziness and High Heart Rate      Medication List    STOP taking these medications   BRILINTA 90 MG Tabs tablet Generic drug:  ticagrelor   metoprolol tartrate 25 MG tablet Commonly known as:  LOPRESSOR     TAKE these medications   acetaminophen 500 MG tablet Commonly known as:  TYLENOL Take 500 mg by mouth every 6 (six) hours as needed for moderate pain. What changed:  Another medication with the same name was removed. Continue taking this medication, and follow the directions you see here.   aspirin 81 MG chewable tablet Chew 1 tablet (81 mg total) by mouth daily.   atorvastatin 80 MG tablet Commonly known as:  LIPITOR Take 1 tablet (80 mg total) by mouth daily at 6 PM.   baclofen 20 MG tablet Commonly known as:  LIORESAL Take 20 mg by mouth 4 (four) times daily.   benzonatate 100 MG capsule Commonly known as:  TESSALON Take 1 capsule (100 mg total) by mouth every 8 (eight) hours.   Cane Misc 1 Units by Does not apply route once.   carvedilol 6.25 MG tablet Commonly known as:  COREG TK 1 T PO TWICE DAILY. STOP METOPROLOL   diphenhydrAMINE 25 MG tablet  Commonly known as:  SOMINEX Take 50 mg by mouth daily as needed for itching or sleep.   gabapentin 300 MG capsule Commonly known as:  NEURONTIN Take 300 mg by mouth 3 (three) times daily.   levothyroxine 100 MCG tablet Commonly known as:  SYNTHROID, LEVOTHROID Take 1 tablet (100 mcg total) by mouth daily before breakfast.   nitroGLYCERIN 0.4 MG SL tablet Commonly known as:  NITROSTAT Place 1 tablet (0.4 mg total) under the tongue  every 5 (five) minutes as needed for chest pain.   pantoprazole 40 MG tablet Commonly known as:  PROTONIX Take 1 tablet (40 mg total) by mouth daily at 12 noon.   polyethylene glycol packet Commonly known as:  MIRALAX / GLYCOLAX Take 17 g by mouth daily.      Follow-up Information    [name], MD. Schedule an appointment as soon as possible for a visit in 1 week(s).   Specialty:  Family Medicine Contact information: [place] [address] [phone]          Allergies  Allergen Reactions  . Blue Dyes (Parenteral) Anaphylaxis    Blueberries - anaphylaxis   . Zofran [Ondansetron Hcl] Other (See Comments)    Dizziness and High Heart Rate    Consultations:  Cardiology.    Procedures/Studies: Dg Chest 2 View  Result Date: 01/14/2017 CLINICAL DATA:  Initial evaluation for acute upper middle chest pain. EXAM: CHEST  2 VIEW COMPARISON:  Prior radiograph from 11/15/2016. FINDINGS: The cardiac and mediastinal silhouettes are stable in size and contour, and remain within normal limits. The lungs are normally inflated. Minimal linear scarring noted at the left perihilar region, stable. No airspace consolidation, pleural effusion, or pulmonary edema is identified. There is no pneumothorax. No acute osseous abnormality identified.  Scoliosis noted. IMPRESSION: No active cardiopulmonary disease. Electronically Signed   By: Jeannine Boga M.D.   On: 01/14/2017 18:17   Nm Myocar Multi W/spect W/wall Motion / Ef  Result Date: 01/15/2017 CLINICAL DATA:  Chest pain, hypertension, coronary artery disease, elevated triglycerides EXAM: MYOCARDIAL IMAGING WITH SPECT (REST AND PHARMACOLOGIC-STRESS) GATED LEFT VENTRICULAR WALL MOTION STUDY LEFT VENTRICULAR EJECTION FRACTION TECHNIQUE: Standard myocardial SPECT imaging was performed after resting intravenous injection of 10 mCi Tc-50m tetrofosmin. Subsequently, intravenous infusion of Lexiscan was performed under the  supervision of the Cardiology staff. At peak effect of the drug, 30 mCi Tc-72m tetrofosmin was injected intravenously and standard myocardial SPECT imaging was performed. Quantitative gated imaging was also performed to evaluate left ventricular wall motion, and estimate left ventricular ejection fraction. COMPARISON:  None FINDINGS: Perfusion: Moderate-sized perfusion defects identified at the inferolateral wall LEFT ventricle. Majority of defect remains unchanged on resting exam with minimal reperfusion questioned at the lateral margin. Wall Motion: Normal left ventricular wall motion. No left ventricular dilation. Left Ventricular Ejection Fraction: 59 % End diastolic volume 99 ml End systolic volume 40 ml IMPRESSION: 1. Predominant non reversible zone of decreased myocardial perfusion at the inferolateral wall LEFT ventricle with question minimal reperfusion at the lateral margin. 2. Normal left ventricular wall motion. 3. Left ventricular ejection fraction 59% 4. Non invasive risk stratification*: Low *2012 Appropriate Use Criteria for Coronary Revascularization Focused Update: J Am Coll Cardiol. 9147;82(9):562-130. http://[email or web address] Electronically Signed   By: Lavonia Dana M.D.   On: 01/15/2017 14:57       Subjective: No new complaints.   Discharge Exam: Vitals:   01/18/17 1040 01/18/17 1227  BP: 109/72 (!) 105/48  Pulse:  Marland Kitchen)  59  Resp:  20  Temp:  (!) 97.1 F (36.2 C)  SpO2:  100%   Vitals:   01/17/17 2002 01/18/17 0411 01/18/17 1040 01/18/17 1227  BP: (!) 107/49 (!) 113/49 109/72 (!) 105/48  Pulse: (!) 56 (!) 50  (!) 59  Resp: 18 18  20   Temp: 97.6 F (36.4 C) (!) 97.5 F (36.4 C)  (!) 97.1 F (36.2 C)  TempSrc: Oral Oral  Oral  SpO2: 96% 100%  100%  Weight:  97.6 kg (215 lb 3.2 oz)    Height:        General: Pt is alert, awake, not in acute distress Cardiovascular: RRR, S1/S2 +, no rubs, no gallops Respiratory: CTA bilaterally, no  wheezing, no rhonchi Abdominal: Soft, NT, ND, bowel sounds + Extremities: no edema, no cyanosis    The results of significant diagnostics from this hospitalization (including imaging, microbiology, ancillary and laboratory) are listed below for reference.     Microbiology: No results found for this or any previous visit (from the past 240 hour(s)).   Labs: BNP (last 3 results) No results for input(s): BNP in the last 8760 hours. Basic Metabolic Panel:  Recent Labs Lab 01/14/17 1758  NA 143  K 4.1  CL 111  CO2 22  GLUCOSE 91  BUN 14  CREATININE 1.12*  CALCIUM 8.8*   Liver Function Tests: No results for input(s): AST, ALT, ALKPHOS, BILITOT, PROT, ALBUMIN in the last 168 hours. No results for input(s): LIPASE, AMYLASE in the last 168 hours. No results for input(s): AMMONIA in the last 168 hours. CBC:  Recent Labs Lab 01/14/17 1758 01/15/17 0525 01/16/17 0433 01/17/17 0241 01/18/17 0516  WBC 9.0 8.2 7.2 6.7 7.1  HGB 12.8 11.7* 11.1* 12.0 11.4*  HCT 39.0 36.3 35.3* 38.0 36.9  MCV 94.2 94.8 97.5 96.0 96.9  PLT 238 207 203 211 185   Cardiac Enzymes:  Recent Labs Lab 01/15/17 0928  TROPONINI <0.03   BNP: Invalid input(s): POCBNP CBG: No results for input(s): GLUCAP in the last 168 hours. D-Dimer No results for input(s): DDIMER in the last 72 hours. Hgb A1c No results for input(s): HGBA1C in the last 72 hours. Lipid Profile No results for input(s): CHOL, HDL, LDLCALC, TRIG, CHOLHDL, LDLDIRECT in the last 72 hours. Thyroid function studies No results for input(s): TSH, T4TOTAL, T3FREE, THYROIDAB in the last 72 hours.  Invalid input(s): FREET3 Anemia work up No results for input(s): VITAMINB12, FOLATE, FERRITIN, TIBC, IRON, RETICCTPCT in the last 72 hours. Urinalysis    Component Value Date/Time   COLORURINE AMBER (A) 02/17/2015 2129   APPEARANCEUR CLOUDY (A) 02/17/2015 2129   LABSPEC 1.024 02/17/2015 2129   PHURINE 6.0 02/17/2015 2129   GLUCOSEU  NEGATIVE 02/17/2015 2129   HGBUR TRACE (A) 02/17/2015 2129   BILIRUBINUR SMALL (A) 02/17/2015 2129   KETONESUR NEGATIVE 02/17/2015 2129   PROTEINUR NEGATIVE 02/17/2015 2129   UROBILINOGEN 1.0 02/17/2015 2129   NITRITE NEGATIVE 02/17/2015 2129   LEUKOCYTESUR MODERATE (A) 02/17/2015 2129   Sepsis Labs Invalid input(s): PROCALCITONIN,  WBC,  LACTICIDVEN Microbiology No results found for this or any previous visit (from the past 240 hour(s)).   Time coordinating discharge: Over 30 minutes  SIGNED:   Hosie Poisson, MD  [facility] 01/19/2017, 8:28 AM Pager   If 7PM-7AM, please contact night-coverage [email or web address] Password [email or web address]

## 2017-03-15 ENCOUNTER — Encounter (HOSPITAL_COMMUNITY): Payer: Self-pay | Admitting: Emergency Medicine

## 2017-03-15 ENCOUNTER — Emergency Department (HOSPITAL_COMMUNITY): Payer: Medicaid Other

## 2017-03-15 ENCOUNTER — Emergency Department (HOSPITAL_COMMUNITY)
Admission: EM | Admit: 2017-03-15 | Discharge: 2017-03-15 | Disposition: A | Discharge status: Home / Self Care | Payer: Medicaid Other | Attending: Emergency Medicine | Admitting: Emergency Medicine

## 2017-03-15 DIAGNOSIS — Z79899 Other long term (current) drug therapy: Secondary | ICD-10-CM | POA: Insufficient documentation

## 2017-03-15 DIAGNOSIS — Z7982 Long term (current) use of aspirin: Secondary | ICD-10-CM | POA: Insufficient documentation

## 2017-03-15 DIAGNOSIS — I259 Chronic ischemic heart disease, unspecified: Secondary | ICD-10-CM | POA: Diagnosis not present

## 2017-03-15 DIAGNOSIS — F17200 Nicotine dependence, unspecified, uncomplicated: Secondary | ICD-10-CM | POA: Insufficient documentation

## 2017-03-15 DIAGNOSIS — Z955 Presence of coronary angioplasty implant and graft: Secondary | ICD-10-CM | POA: Insufficient documentation

## 2017-03-15 DIAGNOSIS — I10 Essential (primary) hypertension: Secondary | ICD-10-CM | POA: Insufficient documentation

## 2017-03-15 DIAGNOSIS — M5431 Sciatica, right side: Secondary | ICD-10-CM | POA: Diagnosis not present

## 2017-03-15 DIAGNOSIS — M25851 Other specified joint disorders, right hip: Secondary | ICD-10-CM | POA: Insufficient documentation

## 2017-03-15 DIAGNOSIS — E890 Postprocedural hypothyroidism: Secondary | ICD-10-CM | POA: Diagnosis not present

## 2017-03-15 DIAGNOSIS — I252 Old myocardial infarction: Secondary | ICD-10-CM | POA: Diagnosis not present

## 2017-03-15 DIAGNOSIS — M25551 Pain in right hip: Secondary | ICD-10-CM | POA: Diagnosis present

## 2017-03-15 MED ORDER — NAPROXEN 500 MG PO TABS: 500.0000 mg | ORAL_TABLET | Freq: Two times a day (BID) | ORAL | 0 refills | Status: AC

## 2017-03-15 MED ORDER — HYDROCODONE-ACETAMINOPHEN 5-325 MG PO TABS
1.0000 | ORAL_TABLET | Freq: Once | ORAL | Status: AC
  Administered 2017-03-15: 1 via ORAL
  Filled 2017-03-15: qty 1

## 2017-03-15 MED ORDER — PREDNISONE 10 MG PO TABS: 60.0000 mg | ORAL_TABLET | Freq: Every day | ORAL | 0 refills | Status: AC

## 2017-03-15 MED ORDER — HYDROCODONE-ACETAMINOPHEN 5-325 MG PO TABS: 1.0000 | ORAL_TABLET | Freq: Two times a day (BID) | ORAL | 0 refills | Status: AC | PRN

## 2017-03-15 MED ORDER — NAPROXEN 500 MG PO TABS
500.0000 mg | ORAL_TABLET | Freq: Once | ORAL | Status: AC
  Administered 2017-03-15: 500 mg via ORAL
  Filled 2017-03-15: qty 1

## 2017-03-15 NOTE — ED Triage Notes (Signed)
Reports right hip pain with radiation down the right leg. States she received a shot in the hip on Monday by PCP that helped with the pain but has worn off. Using cane and wheelchair to ambulate.  A/O NAD

## 2017-03-15 NOTE — ED Notes (Signed)
Patient in radiology

## 2017-03-15 NOTE — Discharge Instructions (Signed)
Your pain appears to be due to nerve impingement. We are treating her symptoms aggressively with pain medicine and steroids. We need to do see her primary care doctor for optimal evaluation and possibly physical therapy.

## 2017-03-15 NOTE — ED Provider Notes (Addendum)
[facility] DEPT Provider Note   CSN: [id number] Arrival date & time: 03/15/17  1440     History   Chief Complaint Chief Complaint  Patient presents with  . Hip Pain    HPI Brittney Bowen is a 61 y.o. female.  HPI Patient comes in with chief complaint of hip pain. Patient has history of NTEMI. She reports that she had a fall about a month ago, and had some pain in her right hip which resolved. She started having pain again over her right hip area which radiates down to the right leg 1 week ago. The pain has been constant and is worse with any kind of movement or activity. Patient describes the pain as sharp, stabbing, and there is associated tingling with it. No history of similar pain in the past. Patient has no history of cancer. Pt has no associated weakness, urinary incontinence, urinary retention, bowel incontinence, pins and needle sensation in the perineal area.    Past Medical History:  Diagnosis Date  . Coronary artery disease    a. 10/2014: NSTEMI with DES placed to OM1  . H. pylori infection    treated Sept 2016  . Hyperlipidemia   . Hypertension   . Hypothyroidism   . Nausea & vomiting   . Thyroid disease     Patient Active Problem List   Diagnosis Date Noted  . Chest pain 01/14/2017  . AKI (acute kidney injury) ([place]) 01/14/2017  . Hypertension   . Hyperlipidemia   . Coronary artery disease   . Intractable nausea and vomiting 02/18/2015  . Lung nodule 02/18/2015  . RLQ abdominal pain 02/18/2015  . Hypothyroidism 02/18/2015  . Protein-calorie malnutrition, severe ([facility]) 02/18/2015  . Nausea with vomiting   . CAD S/P OM1 DES 11/04/14 11/05/2014  . Dyslipidemia 11/05/2014  . Smoker 11/05/2014  . NSTEMI (non-ST elevated myocardial infarction) ([place]) 11/03/2014  . Postoperative hypothyroidism   . Elevated troponin 11/02/2014  . Neck pain     Past Surgical History:  Procedure Laterality Date  . CARDIAC CATHETERIZATION N/A 11/04/2014   Procedure:  Left Heart Cath and Coronary Angiography;  Surgeon: Jettie Booze, MD;  Location: [facility];  Service: Cardiovascular;  Laterality: N/A;  . CARDIAC CATHETERIZATION Right 11/04/2014   Procedure: Coronary Stent Intervention;  Surgeon: Jettie Booze, MD;  Location: [facility];  Service: Cardiovascular;  Laterality: Right;  . ESOPHAGOGASTRODUODENOSCOPY (EGD) WITH PROPOFOL N/A 02/19/2015   Procedure: ESOPHAGOGASTRODUODENOSCOPY (EGD) WITH PROPOFOL;  Surgeon: Manus Gunning, MD;  Location: [facility];  Service: Gastroenterology;  Laterality: N/A;  . THYROIDECTOMY      OB History    No data available       Home Medications    Prior to Admission medications   Medication Sig Start Date End Date Taking? Authorizing Provider  acetaminophen (TYLENOL) 500 MG tablet Take 500 mg by mouth every 6 (six) hours as needed for moderate pain.    [provider]  aspirin 81 MG chewable tablet Chew 1 tablet (81 mg total) by mouth daily. 11/05/14   Erlene Quan, PA-C  atorvastatin (LIPITOR) 80 MG tablet Take 1 tablet (80 mg total) by mouth daily at 6 PM. 03/23/16   [name], MD  baclofen (LIORESAL) 20 MG tablet Take 20 mg by mouth 4 (four) times daily. 12/20/16   [provider]  benzonatate (TESSALON) 100 MG capsule Take 1 capsule (100 mg total) by mouth every 8 (eight) hours. 11/15/16  Fawze, Mina A, PA-C  carvedilol (COREG) 6.25 MG tablet TK 1 T PO TWICE DAILY. STOP METOPROLOL 12/02/15   [provider]  diphenhydrAMINE (SOMINEX) 25 MG tablet Take 50 mg by mouth daily as needed for itching or sleep.    [provider]  gabapentin (NEURONTIN) 300 MG capsule Take 300 mg by mouth 3 (three) times daily. 12/20/16   [provider]  HYDROcodone-acetaminophen (NORCO/VICODIN) 5-325 MG tablet Take 1 tablet by mouth every 12 (twelve) hours as needed for severe pain. 03/15/17   Varney Biles, MD  levothyroxine (SYNTHROID, LEVOTHROID) 100  MCG tablet Take 1 tablet (100 mcg total) by mouth daily before breakfast. 03/17/15   Armbruster, Carlota Raspberry, MD  Misc. Devices (CANE) MISC 1 Units by Does not apply route once. 12/07/15   Pieter Partridge, DO  naproxen (NAPROSYN) 500 MG tablet Take 1 tablet (500 mg total) by mouth 2 (two) times daily with a meal. 03/15/17   Varney Biles, MD  nitroGLYCERIN (NITROSTAT) 0.4 MG SL tablet Place 1 tablet (0.4 mg total) under the tongue every 5 (five) minutes as needed for chest pain. 11/05/14   Erlene Quan, PA-C  pantoprazole (PROTONIX) 40 MG tablet Take 1 tablet (40 mg total) by mouth daily at 12 noon. 01/19/17   Hosie Poisson, MD  polyethylene glycol (MIRALAX / GLYCOLAX) packet Take 17 g by mouth daily. 01/19/17   Hosie Poisson, MD  predniSONE (DELTASONE) 10 MG tablet Take 6 tablets (60 mg total) by mouth daily. 03/15/17   Varney Biles, MD    Family History Family History  Problem Relation Age of Onset  . Pulmonary embolism Mother   . Heart attack Mother   . CAD Father   . Diabetes Mellitus II Father   . Heart attack Father   . Hypertension Maternal Grandmother   . Stroke Maternal Grandmother   . Esophageal cancer Neg Hx   . Colon cancer Neg Hx   . Pancreatic cancer Neg Hx   . Stomach cancer Neg Hx   . Kidney disease Neg Hx   . Liver disease Neg Hx     Social History Social History  Substance Use Topics  . Smoking status: Current Every Day Smoker  . Smokeless tobacco: Never Used     Comment: form given 03/17/15  . Alcohol use No     Allergies   Blue dyes (parenteral) and Zofran [ondansetron hcl]   Review of Systems Review of Systems  Constitutional: Positive for activity change.  Musculoskeletal: Positive for back pain and gait problem.  Skin: Negative for rash.  Neurological: Positive for numbness.     Physical Exam Updated Vital Signs BP 117/75 (BP Location: Left Arm)   Pulse 69   Temp 98.3 F (36.8 C) (Oral)   Resp 20   SpO2 96%   Physical Exam    Constitutional: She is oriented to person, place, and time. She appears well-developed.  HENT:  Head: Normocephalic and atraumatic.  Eyes: EOM are normal.  Neck: Normal range of motion. Neck supple.  Cardiovascular: Normal rate.   Pulmonary/Chest: Effort normal.  Abdominal: Bowel sounds are normal.  Musculoskeletal:  + passive straight leg raise Pt has tenderness to palpation of the lower lumbar spine and the R hip joint  Neurological: She is alert and oriented to person, place, and time.  Skin: Skin is warm and dry.  Nursing note and vitals reviewed.    ED Treatments / Results  Labs (all labs ordered are listed, but only abnormal  results are displayed) Labs Reviewed - No data to display  EKG  EKG Interpretation None       Radiology Dg Hip Unilat W Or Wo Pelvis 2-3 Views Right  Result Date: 03/15/2017 CLINICAL DATA:  Fall 1 month ago.  Increasing right hip pain. EXAM: DG HIP (WITH OR WITHOUT PELVIS) 2-3V RIGHT COMPARISON:  None. FINDINGS: There is no evidence of hip fracture or dislocation. There is no evidence of arthropathy or other focal bone abnormality. IMPRESSION: Negative right hip radiographs. Electronically Signed   By: San Morelle M.D.   On: 03/15/2017 20:02    Procedures Procedures (including critical care time)  Medications Ordered in ED Medications  naproxen (NAPROSYN) tablet 500 mg (500 mg Oral Given 03/15/17 1929)  HYDROcodone-acetaminophen (NORCO/VICODIN) 5-325 MG per tablet 1 tablet (1 tablet Oral Given 03/15/17 1929)     Initial Impression / Assessment and Plan / ED Course  I have reviewed the triage vital signs and the nursing notes.  Pertinent labs & imaging results that were available during my care of the patient were reviewed by me and considered in my medical decision making (see chart for details).  Clinical Course as of Mar 15 2106  Wed Mar 15, 2017  2107 Results from the ER workup discussed with the patient face to face and  all questions answered to the best of my ability.  DG Hip Unilat W or Wo Pelvis 2-3 Views Right [AN]    Clinical Course User Index [AN] Varney Biles, MD    Patient comes in with chief complaint of right sided hip pain. Pain is radiating down towards her foot and there is associated tingling which makes me think that the pain is likely radicular in nature. Patient has no known history of back issues or sciatica, given her age over 83 we will get a screening x-ray.  I have advised that patient needs to see her primary care doctor, we will start treating her symptoms aggressively however she might need physical therapy and advanced imaging if her symptoms continued to get worse.  Final Clinical Impressions(s) / ED Diagnoses   Final diagnoses:  Right hip pain  Sciatica of right side  Right hip impingement syndrome    New Prescriptions New Prescriptions   HYDROCODONE-ACETAMINOPHEN (NORCO/VICODIN) 5-325 MG TABLET    Take 1 tablet by mouth every 12 (twelve) hours as needed for severe pain.   NAPROXEN (NAPROSYN) 500 MG TABLET    Take 1 tablet (500 mg total) by mouth 2 (two) times daily with a meal.   PREDNISONE (DELTASONE) 10 MG TABLET    Take 6 tablets (60 mg total) by mouth daily.     Varney Biles, MD 03/15/17 3491    Varney Biles, MD 03/15/17 2107

## 2017-05-23 ENCOUNTER — Other Ambulatory Visit: Payer: Self-pay

## 2017-05-23 ENCOUNTER — Encounter (HOSPITAL_COMMUNITY): Payer: Self-pay | Admitting: Emergency Medicine

## 2017-05-23 ENCOUNTER — Emergency Department (HOSPITAL_COMMUNITY): Payer: Medicaid Other

## 2017-05-23 ENCOUNTER — Emergency Department (HOSPITAL_COMMUNITY)
Admission: EM | Admit: 2017-05-23 | Discharge: 2017-05-23 | Disposition: A | Discharge status: Home / Self Care | Payer: Medicaid Other | Attending: Physician Assistant | Admitting: Physician Assistant

## 2017-05-23 DIAGNOSIS — I10 Essential (primary) hypertension: Secondary | ICD-10-CM | POA: Insufficient documentation

## 2017-05-23 DIAGNOSIS — E785 Hyperlipidemia, unspecified: Secondary | ICD-10-CM | POA: Diagnosis not present

## 2017-05-23 DIAGNOSIS — Z7982 Long term (current) use of aspirin: Secondary | ICD-10-CM | POA: Insufficient documentation

## 2017-05-23 DIAGNOSIS — E039 Hypothyroidism, unspecified: Secondary | ICD-10-CM | POA: Insufficient documentation

## 2017-05-23 DIAGNOSIS — M5441 Lumbago with sciatica, right side: Secondary | ICD-10-CM | POA: Insufficient documentation

## 2017-05-23 DIAGNOSIS — I252 Old myocardial infarction: Secondary | ICD-10-CM | POA: Diagnosis not present

## 2017-05-23 DIAGNOSIS — G8929 Other chronic pain: Secondary | ICD-10-CM

## 2017-05-23 DIAGNOSIS — I2510 Atherosclerotic heart disease of native coronary artery without angina pectoris: Secondary | ICD-10-CM | POA: Diagnosis not present

## 2017-05-23 DIAGNOSIS — Z79899 Other long term (current) drug therapy: Secondary | ICD-10-CM | POA: Diagnosis not present

## 2017-05-23 DIAGNOSIS — F17200 Nicotine dependence, unspecified, uncomplicated: Secondary | ICD-10-CM | POA: Insufficient documentation

## 2017-05-23 DIAGNOSIS — M545 Low back pain: Secondary | ICD-10-CM | POA: Diagnosis present

## 2017-05-23 MED ORDER — OXYCODONE-ACETAMINOPHEN 5-325 MG PO TABS
1.0000 | ORAL_TABLET | Freq: Once | ORAL | Status: AC
  Administered 2017-05-23: 1 via ORAL
  Filled 2017-05-23: qty 1

## 2017-05-23 NOTE — Discharge Instructions (Signed)
Please read attached information. If you experience any new or worsening signs or symptoms please return to the emergency room for evaluation. Please follow-up with your primary care provider or specialist as discussed.  °

## 2017-05-23 NOTE — ED Notes (Signed)
Patient returned from radiology

## 2017-05-23 NOTE — ED Notes (Signed)
Pt in xray. They will bring pt to room.

## 2017-05-23 NOTE — ED Triage Notes (Signed)
[address] [address] a month ago  Tripped and then fell down the steps  A couple of weeks ago saw her dr on Monday , placed on pain pills did not help , still having back pain and rt side pain states fell again in snow, was told shr had fx disc a year ago

## 2017-05-23 NOTE — ED Notes (Signed)
Notified radiology patient and be transported to POD A 10.

## 2017-05-23 NOTE — ED Provider Notes (Signed)
[facility] EMERGENCY DEPARTMENT Provider Note   CSN: [id number] Arrival date & time: 05/23/17  1209    History   Chief Complaint Chief Complaint  Patient presents with  . Fall  . Back Pain  . Knee Pain    HPI Brittney Bowen is a 61 y.o. female.  HPI   61 year old female presents today with complaints of musculoskeletal pain.  Patient reports that for several years she has had chronic lower back and hip pain.  She notes she has been seen by orthopedics approximately 2 years ago was receiving injections which did not improve her symptoms.  She notes she stopped seeing orthopedics and has just been seen by her primary care provider for this.  She notes over-the-counter medications narcotic pain medication and gabapentin have not improved her symptoms.  She notes she is waiting on a referral from orthopedics but has not heard back.  Patient notes that last week she fell in the snow causing slightly worsening of her symptoms, but this is characteristic of her chronic pain.  She notes tingling down the right lateral part of her leg, denies any loss of strength or motor function.  Patient denies any fever, acute neurological deficits other than tingling.     Past Medical History:  Diagnosis Date  . Coronary artery disease    a. 10/2014: NSTEMI with DES placed to OM1  . H. pylori infection    treated Sept 2016  . Hyperlipidemia   . Hypertension   . Hypothyroidism   . Nausea & vomiting   . Thyroid disease     Patient Active Problem List   Diagnosis Date Noted  . Chest pain 01/14/2017  . AKI (acute kidney injury) ([place]) 01/14/2017  . Hypertension   . Hyperlipidemia   . Coronary artery disease   . Intractable nausea and vomiting 02/18/2015  . Lung nodule 02/18/2015  . RLQ abdominal pain 02/18/2015  . Hypothyroidism 02/18/2015  . Protein-calorie malnutrition, severe ([place]) 02/18/2015  . Nausea with vomiting   . CAD S/P OM1 DES 11/04/14 11/05/2014  .  Dyslipidemia 11/05/2014  . Smoker 11/05/2014  . NSTEMI (non-ST elevated myocardial infarction) ([place]) 11/03/2014  . Postoperative hypothyroidism   . Elevated troponin 11/02/2014  . Neck pain     Past Surgical History:  Procedure Laterality Date  . CARDIAC CATHETERIZATION N/A 11/04/2014   Procedure: Left Heart Cath and Coronary Angiography;  Surgeon: Jettie Booze, MD;  Location: [facility];  Service: Cardiovascular;  Laterality: N/A;  . CARDIAC CATHETERIZATION Right 11/04/2014   Procedure: Coronary Stent Intervention;  Surgeon: Jettie Booze, MD;  Location: [facility];  Service: Cardiovascular;  Laterality: Right;  . ESOPHAGOGASTRODUODENOSCOPY (EGD) WITH PROPOFOL N/A 02/19/2015   Procedure: ESOPHAGOGASTRODUODENOSCOPY (EGD) WITH PROPOFOL;  Surgeon: Manus Gunning, MD;  Location: [facility];  Service: Gastroenterology;  Laterality: N/A;  . THYROIDECTOMY      OB History    No data available       Home Medications    Prior to Admission medications   Medication Sig Start Date End Date Taking? Authorizing Provider  acetaminophen (TYLENOL) 500 MG tablet Take 500 mg by mouth every 6 (six) hours as needed for moderate pain.   Yes [provider]  aspirin 81 MG chewable tablet Chew 1 tablet (81 mg total) by mouth daily. 11/05/14  Yes Kilroy, Luke K, PA-C  atorvastatin (LIPITOR) 80 MG tablet Take 1 tablet (80 mg total) by mouth daily at 6 PM.  03/23/16  Yes [name], MD  baclofen (LIORESAL) 20 MG tablet Take 20 mg by mouth 4 (four) times daily. 12/20/16  Yes [provider]  carvedilol (COREG) 6.25 MG tablet 6.25MG  BY MOUTH TWICE DAILY. STOP METOPROLOL 12/02/15  Yes [provider]  Cyanocobalamin (VITAMIN B-12 PO) Take 1 tablet by mouth daily.   Yes [provider]  diphenhydrAMINE (SOMINEX) 25 MG tablet Take 50 mg by mouth daily as needed for itching or sleep.   Yes [provider]  gabapentin (NEURONTIN) 300 MG  capsule Take 300 mg by mouth 3 (three) times daily. 12/20/16  Yes [provider]  levothyroxine (SYNTHROID, LEVOTHROID) 100 MCG tablet Take 1 tablet (100 mcg total) by mouth daily before breakfast. 03/17/15  Yes Armbruster, Carlota Raspberry, MD  Misc. Devices (CANE) MISC 1 Units by Does not apply route once. 12/07/15  Yes Jaffe, Adam R, DO  naproxen (NAPROSYN) 500 MG tablet Take 1 tablet (500 mg total) by mouth 2 (two) times daily with a meal. 03/15/17  Yes Nanavati, Ankit, MD  nitroGLYCERIN (NITROSTAT) 0.4 MG SL tablet Place 1 tablet (0.4 mg total) under the tongue every 5 (five) minutes as needed for chest pain. 11/05/14  Yes Kilroy, Luke K, PA-C  pantoprazole (PROTONIX) 40 MG tablet Take 1 tablet (40 mg total) by mouth daily at 12 noon. 01/19/17  Yes Hosie Poisson, MD  benzonatate (TESSALON) 100 MG capsule Take 1 capsule (100 mg total) by mouth every 8 (eight) hours. Patient not taking: Reported on 05/23/2017 11/15/16   Rodell Perna A, PA-C  HYDROcodone-acetaminophen (NORCO/VICODIN) 5-325 MG tablet Take 1 tablet by mouth every 12 (twelve) hours as needed for severe pain. Patient not taking: Reported on 05/23/2017 03/15/17   Varney Biles, MD  polyethylene glycol (MIRALAX / GLYCOLAX) packet Take 17 g by mouth daily. Patient not taking: Reported on 05/23/2017 01/19/17   Hosie Poisson, MD  predniSONE (DELTASONE) 10 MG tablet Take 6 tablets (60 mg total) by mouth daily. Patient not taking: Reported on 05/23/2017 03/15/17   Varney Biles, MD    Family History Family History  Problem Relation Age of Onset  . Pulmonary embolism Mother   . Heart attack Mother   . CAD Father   . Diabetes Mellitus II Father   . Heart attack Father   . Hypertension Maternal Grandmother   . Stroke Maternal Grandmother   . Esophageal cancer Neg Hx   . Colon cancer Neg Hx   . Pancreatic cancer Neg Hx   . Stomach cancer Neg Hx   . Kidney disease Neg Hx   . Liver disease Neg Hx     Social History Social History    Tobacco Use  . Smoking status: Current Every Day Smoker  . Smokeless tobacco: Never Used  . Tobacco comment: form given 03/17/15  Substance Use Topics  . Alcohol use: No    Alcohol/week: 0.0 oz  . Drug use: No     Allergies   Blue dyes (parenteral) and Zofran [ondansetron hcl]   Review of Systems Review of Systems  All other systems reviewed and are negative.    Physical Exam Updated Vital Signs BP (!) 144/59 (BP Location: Left Arm)   Pulse 73   Temp 98.5 F (36.9 C) (Oral)   Resp 17   Ht 5\' 9"  (1.753 m)   Wt 90.7 kg (200 lb)   SpO2 99%   BMI 29.53 kg/m   Physical Exam  Constitutional: She is oriented to person, place, and  time. She appears well-developed and well-nourished.  HENT:  Head: Normocephalic and atraumatic.  Eyes: Conjunctivae are normal. Pupils are equal, round, and reactive to light. Right eye exhibits no discharge. Left eye exhibits no discharge. No scleral icterus.  Neck: Normal range of motion. No JVD present. No tracheal deviation present.  Pulmonary/Chest: Effort normal. No stridor.  Musculoskeletal:  Tenderness palpation of the right lateral lumbar soft tissue and gluteus-extends into the lateral aspect of the hip and lower extremity down to the knee no redness swelling or warmth to touch range of motion of the hip painful in all directions-no CT or L-spine tenderness to palpation-strength intact  Neurological: She is alert and oriented to person, place, and time. Coordination normal.  Psychiatric: She has a normal mood and affect. Her behavior is normal. Judgment and thought content normal.  Nursing note and vitals reviewed.    ED Treatments / Results  Labs (all labs ordered are listed, but only abnormal results are displayed) Labs Reviewed - No data to display  EKG  EKG Interpretation None       Radiology Dg Lumbar Spine Complete  Result Date: 05/23/2017 CLINICAL DATA:  Multiple falls, the most recent 1 week ago. Low back pain  radiating down right leg EXAM: LUMBAR SPINE - COMPLETE 4+ VIEW COMPARISON:  None. FINDINGS: Normal alignment. No fracture. Degenerative facet disease throughout the lumbar spine. SI joints are symmetric and unremarkable. IMPRESSION: Degenerative facet disease.  No fracture or malalignment. Electronically Signed   By: Rolm Baptise M.D.   On: 05/23/2017 13:21   Dg Knee Complete 4 Views Right  Result Date: 05/23/2017 CLINICAL DATA:  Multiple falls.  Right knee pain EXAM: RIGHT KNEE - COMPLETE 4+ VIEW COMPARISON:  None. FINDINGS: Early spurring in the right knee joint. No acute bony abnormality. Specifically, no fracture, subluxation, or dislocation. Soft tissues are intact. No joint effusion. IMPRESSION: No acute bony abnormality. Electronically Signed   By: Rolm Baptise M.D.   On: 05/23/2017 13:22   Dg Hip Unilat W Or Wo Pelvis 2-3 Views Right  Result Date: 05/23/2017 CLINICAL DATA:  History of fall, anterior and posterior right hip pain EXAM: DG HIP (WITH OR WITHOUT PELVIS) 2-3V RIGHT COMPARISON:  03/15/2017 FINDINGS: Right greater than left SI joint degenerative change. Pubic symphysis and rami are intact. No fracture or dislocation. Phleboliths in the left pelvis. IMPRESSION: No acute osseous abnormality. Electronically Signed   By: Donavan Foil M.D.   On: 05/23/2017 15:27    Procedures Procedures (including critical care time)  Medications Ordered in ED Medications  oxyCODONE-acetaminophen (PERCOCET/ROXICET) 5-325 MG per tablet 1 tablet (1 tablet Oral Given 05/23/17 1545)     Initial Impression / Assessment and Plan / ED Course  I have reviewed the triage vital signs and the nursing notes.  Pertinent labs & imaging results that were available during my care of the patient were reviewed by me and considered in my medical decision making (see chart for details).      Final Clinical Impressions(s) / ED Diagnoses   Final diagnoses:  Chronic right-sided low back pain with right-sided  sciatica    Labs:   Imaging: DG lumbar, DG knee, DG hip  Consults:  Therapeutics: percocet   Discharge Meds:   Assessment/Plan: 61 year old female presents today with chronic back and hip pain.  Patient did have a fall the other day, repeat hip imaging will be ordered in addition to those ordered in triage.  I have very low suspicion for acute fracture,  this is likely chronic in nature.  Patient has seen orthopedics in the past, she is requesting referral today.  I find this appropriate.  If no acute findings on plain films patient will continue home management with outpatient follow-up and strict return precautions.      ED Discharge Orders    None       Francee Gentile 05/23/17 2036    Macarthur Critchley, MD 05/25/17 [phone]

## 2017-06-01 ENCOUNTER — Ambulatory Visit (INDEPENDENT_AMBULATORY_CARE_PROVIDER_SITE_OTHER): Payer: Medicaid Other | Admitting: Orthopaedic Surgery

## 2017-06-01 ENCOUNTER — Encounter (INDEPENDENT_AMBULATORY_CARE_PROVIDER_SITE_OTHER): Payer: Self-pay | Admitting: Orthopaedic Surgery

## 2017-06-01 DIAGNOSIS — M25551 Pain in right hip: Secondary | ICD-10-CM

## 2017-06-01 MED ORDER — TRAMADOL HCL 50 MG PO TABS: 50.0000 mg | ORAL_TABLET | Freq: Three times a day (TID) | ORAL | 2 refills | Status: AC | PRN

## 2017-06-01 NOTE — Progress Notes (Signed)
Office Visit Note   Patient: Brittney Bowen           Date of Birth: [birth year]           MRN: [id number] Visit Date: 06/01/2017              Requested by: [name], [place] [address] [place], [place] [address] PCP: [name], MD   Assessment & Plan: Visit Diagnoses:  1. Pain of right hip joint     Plan: Impression is right hip pain possible hip arthritis versus lumbar radiculopathy.  Patient has failed conservative treatment.  Recommend MRI of the lumbar spine and the right hip for better evaluation and to rule out structural abnormalities.  Follow-up after the MRI.  Prescription for tramadol. Total face to face encounter time was greater than 45 minutes and over half of this time was spent in counseling and/or coordination of care.  Follow-Up Instructions: Return in about 2 weeks (around 06/15/2017).   Orders:  Orders Placed This Encounter  Procedures  . MR Lumbar Spine w/o contrast   Meds ordered this encounter  Medications  . traMADol (ULTRAM) 50 MG tablet    Sig: Take 1-2 tablets (50-100 mg total) by mouth 3 (three) times daily as needed.    Dispense:  30 tablet    Refill:  2      Procedures: No procedures performed   Clinical Data: No additional findings.   Subjective: Chief Complaint  Patient presents with  . Right Hip - Pain     Patient comes in today with greater than 40-month history of right hip and low back pain with radiation into her right foot and into the groin.  She has seen her primary care doctor in the past and he has treated this conservatively with prednisone and baclofen and gabapentin.  She states that she has does not have any relief.  She is allergic to hydrocodone.  Denies any numbness but does tingling in the right leg.  She walks with a cane and with a limp.    Review of Systems  Constitutional: Negative.   HENT: Negative.   Eyes: Negative.   Respiratory: Negative.   Cardiovascular: Negative.   Endocrine: Negative.     Musculoskeletal: Negative.   Neurological: Negative.   Hematological: Negative.   Psychiatric/Behavioral: Negative.   All other systems reviewed and are negative.    Objective: Vital Signs: There were no vitals taken for this visit.  Physical Exam  Constitutional: She is oriented to person, place, and time. She appears well-developed and well-nourished.  HENT:  Head: Normocephalic and atraumatic.  Eyes: EOM are normal.  Neck: Neck supple.  Pulmonary/Chest: Effort normal.  Abdominal: Soft.  Neurological: She is alert and oriented to person, place, and time.  Skin: Skin is warm. Capillary refill takes less than 2 seconds.  Psychiatric: She has a normal mood and affect. Her behavior is normal. Judgment and thought content normal.  Nursing note and vitals reviewed.   Ortho Exam Right hip exam shows painful rotation of the hip.  Positive straight leg raise test.  Lateral hip is tender to palpation. Specialty Comments:  No specialty comments available.  Imaging: No results found.   PMFS History: Patient Active Problem List   Diagnosis Date Noted  . Chest pain 01/14/2017  . AKI (acute kidney injury) ([place]) 01/14/2017  . Hypertension   . Hyperlipidemia   . Coronary artery disease   . Intractable nausea and vomiting 02/18/2015  . Lung  nodule 02/18/2015  . RLQ abdominal pain 02/18/2015  . Hypothyroidism 02/18/2015  . Protein-calorie malnutrition, severe ([place]) 02/18/2015  . Nausea with vomiting   . CAD S/P OM1 DES 11/04/14 11/05/2014  . Dyslipidemia 11/05/2014  . Smoker 11/05/2014  . NSTEMI (non-ST elevated myocardial infarction) ([place]) 11/03/2014  . Postoperative hypothyroidism   . Elevated troponin 11/02/2014  . Neck pain    Past Medical History:  Diagnosis Date  . Coronary artery disease    a. 10/2014: NSTEMI with DES placed to OM1  . H. pylori infection    treated Sept 2016  . Hyperlipidemia   . Hypertension   . Hypothyroidism   . Nausea & vomiting   .  Thyroid disease     Family History  Problem Relation Age of Onset  . Pulmonary embolism Mother   . Heart attack Mother   . CAD Father   . Diabetes Mellitus II Father   . Heart attack Father   . Hypertension Maternal Grandmother   . Stroke Maternal Grandmother   . Esophageal cancer Neg Hx   . Colon cancer Neg Hx   . Pancreatic cancer Neg Hx   . Stomach cancer Neg Hx   . Kidney disease Neg Hx   . Liver disease Neg Hx     Past Surgical History:  Procedure Laterality Date  . CARDIAC CATHETERIZATION N/A 11/04/2014   Procedure: Left Heart Cath and Coronary Angiography;  Surgeon: Jettie Booze, MD;  Location: [facility];  Service: Cardiovascular;  Laterality: N/A;  . CARDIAC CATHETERIZATION Right 11/04/2014   Procedure: Coronary Stent Intervention;  Surgeon: Jettie Booze, MD;  Location: [facility];  Service: Cardiovascular;  Laterality: Right;  . ESOPHAGOGASTRODUODENOSCOPY (EGD) WITH PROPOFOL N/A 02/19/2015   Procedure: ESOPHAGOGASTRODUODENOSCOPY (EGD) WITH PROPOFOL;  Surgeon: Manus Gunning, MD;  [facility];  Service: Gastroenterology;  Laterality: N/A;  . THYROIDECTOMY     Social History   Occupational History  . Not on file  Tobacco Use  . Smoking status: Current Every Day Smoker  . Smokeless tobacco: Never Used  . Tobacco comment: form given 03/17/15  Substance and Sexual Activity  . Alcohol use: No    Alcohol/week: 0.0 oz  . Drug use: No  . Sexual activity: Not on file

## 2017-06-02 ENCOUNTER — Other Ambulatory Visit (INDEPENDENT_AMBULATORY_CARE_PROVIDER_SITE_OTHER): Payer: Self-pay

## 2017-06-02 DIAGNOSIS — M25551 Pain in right hip: Secondary | ICD-10-CM

## 2017-06-02 NOTE — Progress Notes (Signed)
Mr

## 2017-06-11 ENCOUNTER — Ambulatory Visit
Admission: RE | Admit: 2017-06-11 | Discharge: 2017-06-11 | Disposition: A | Discharge status: Home / Self Care | Payer: Medicaid Other | Source: Ambulatory Visit | Attending: Orthopaedic Surgery | Admitting: Orthopaedic Surgery

## 2017-06-11 DIAGNOSIS — M25551 Pain in right hip: Secondary | ICD-10-CM

## 2017-06-16 ENCOUNTER — Ambulatory Visit (INDEPENDENT_AMBULATORY_CARE_PROVIDER_SITE_OTHER): Payer: Medicaid Other

## 2017-06-16 ENCOUNTER — Ambulatory Visit (INDEPENDENT_AMBULATORY_CARE_PROVIDER_SITE_OTHER): Payer: Medicaid Other | Admitting: Orthopaedic Surgery

## 2017-06-16 ENCOUNTER — Encounter (INDEPENDENT_AMBULATORY_CARE_PROVIDER_SITE_OTHER): Payer: Self-pay | Admitting: Orthopaedic Surgery

## 2017-06-16 DIAGNOSIS — M1611 Unilateral primary osteoarthritis, right hip: Secondary | ICD-10-CM | POA: Diagnosis not present

## 2017-06-16 MED ORDER — BUPIVACAINE HCL 0.5 % IJ SOLN
3.0000 mL | INTRAMUSCULAR | Status: AC | PRN
  Administered 2017-06-16: 3 mL via INTRA_ARTICULAR

## 2017-06-16 MED ORDER — TRIAMCINOLONE ACETONIDE 40 MG/ML IJ SUSP
80.0000 mg | INTRAMUSCULAR | Status: AC | PRN
  Administered 2017-06-16: 80 mg via INTRA_ARTICULAR

## 2017-06-16 NOTE — Progress Notes (Signed)
Office Visit Note   Patient: Brittney Bowen           Date of Birth: [birth year]           MRN: [id number] Visit Date: 06/16/2017              Requested by: [name], [address] [place], [place] [address] PCP: [name], MD   Assessment & Plan: Visit Diagnoses:  1. Primary osteoarthritis of right hip     Plan: Impression is 62 year old female with primarily right hip degenerative joint disease.  MRI findings were reviewed with the patient which shows degenerative findings within the hip joint and degenerative labral tear.  She does not have any real stenosis in her lumbar spine.  I sent her over to Dr. Ernestina Patches for a cortisone injection in her right hip.  I will see her back in 3 weeks to see how she responds to this injection.  Follow-Up Instructions: Return in about 3 weeks (around 07/07/2017).   Orders:  Orders Placed This Encounter  Procedures  . Large Joint Inj: R hip joint  . XR C-ARM NO REPORT   No orders of the defined types were placed in this encounter.     Procedures: No procedures performed   Clinical Data: No additional findings.   Subjective: Chief Complaint  Patient presents with  . Right Hip - Pain  . Lower Back - Pain    Patient comes in today review her MRI of her lumbar spine and right hip.  Her pain is mainly in the right hip with only occasional radiation to past the knees.    Review of Systems   Objective: Vital Signs: There were no vitals taken for this visit.  Physical Exam  Ortho Exam Right hip exam is stable. Specialty Comments:  No specialty comments available.  Imaging: Xr C-arm No Report  Result Date: 06/16/2017 Please see Notes or Procedures tab for imaging impression.    PMFS History: Patient Active Problem List   Diagnosis Date Noted  . Chest pain 01/14/2017  . AKI (acute kidney injury) ([facility]) 01/14/2017  . Hypertension   . Hyperlipidemia   . Coronary artery disease   . Intractable nausea and  vomiting 02/18/2015  . Lung nodule 02/18/2015  . RLQ abdominal pain 02/18/2015  . Hypothyroidism 02/18/2015  . Protein-calorie malnutrition, severe ([place]) 02/18/2015  . Nausea with vomiting   . CAD S/P OM1 DES 11/04/14 11/05/2014  . Dyslipidemia 11/05/2014  . Smoker 11/05/2014  . NSTEMI (non-ST elevated myocardial infarction) ([place]) 11/03/2014  . Postoperative hypothyroidism   . Elevated troponin 11/02/2014  . Neck pain    Past Medical History:  Diagnosis Date  . Coronary artery disease    a. 10/2014: NSTEMI with DES placed to OM1  . H. pylori infection    treated Sept 2016  . Hyperlipidemia   . Hypertension   . Hypothyroidism   . Nausea & vomiting   . Thyroid disease     Family History  Problem Relation Age of Onset  . Pulmonary embolism Mother   . Heart attack Mother   . CAD Father   . Diabetes Mellitus II Father   . Heart attack Father   . Hypertension Maternal Grandmother   . Stroke Maternal Grandmother   . Esophageal cancer Neg Hx   . Colon cancer Neg Hx   . Pancreatic cancer Neg Hx   . Stomach cancer Neg Hx   . Kidney disease Neg Hx   .  Liver disease Neg Hx     Past Surgical History:  Procedure Laterality Date  . CARDIAC CATHETERIZATION N/A 11/04/2014   Procedure: Left Heart Cath and Coronary Angiography;  Surgeon: Jettie Booze, MD;  Location: [facility];  Service: Cardiovascular;  Laterality: N/A;  . CARDIAC CATHETERIZATION Right 11/04/2014   Procedure: Coronary Stent Intervention;  Surgeon: Jettie Booze, MD;  Location: [facility];  Service: Cardiovascular;  Laterality: Right;  . ESOPHAGOGASTRODUODENOSCOPY (EGD) WITH PROPOFOL N/A 02/19/2015   Procedure: ESOPHAGOGASTRODUODENOSCOPY (EGD) WITH PROPOFOL;  Surgeon: Manus Gunning, MD;  [facility];  Service: Gastroenterology;  Laterality: N/A;  . THYROIDECTOMY     Social History   Occupational History  . Not on file  Tobacco Use  . Smoking status: Current Every Day  Smoker  . Smokeless tobacco: Never Used  . Tobacco comment: form given 03/17/15  Substance and Sexual Activity  . Alcohol use: No    Alcohol/week: 0.0 oz  . Drug use: No  . Sexual activity: Not on file

## 2017-06-16 NOTE — Progress Notes (Signed)
Brittney Bowen - 62 y.o. female MRN [id number]  Date of birth: 1956/02/21  Office Visit Note: Visit Date: 06/16/2017 PCP: [name], MD Referred by: [name], MD  Subjective: Chief Complaint  Patient presents with  . Right Hip - Pain  . Lower Back - Pain   HPI: A 62 year old pleasant but anxious female who saw Dr. Erlinda Hong today in follow-up of her right hip and groin pain.  She has MRI evidence of labral tear.  He wanted to have her worked in today for diagnostic and therapeutic anesthetic hip arthrogram and intra-articular injection.    ROS Otherwise per HPI.  Assessment & Plan: Visit Diagnoses:  1. Primary osteoarthritis of right hip     Plan: Findings:  Diagnostic and hopefully therapeutic anesthetic hip arthrogram on the right.  Patient did have some relief during the anesthetic phase.    Meds & Orders: No orders of the defined types were placed in this encounter.   Orders Placed This Encounter  Procedures  . Large Joint Inj: R hip joint  . XR C-ARM NO REPORT    Follow-up: Return in about 3 weeks (around 07/07/2017).   Procedures: Large Joint Inj: R hip joint on 06/16/2017 10:45 AM Indications: pain and diagnostic evaluation Details: 22 G 3.5 in needle, anterior approach  Arthrogram: Yes  Medications: 80 mg triamcinolone acetonide 40 MG/ML; 3 mL bupivacaine 0.5 % Outcome: tolerated well, no immediate complications  Arthrogram demonstrated excellent flow of contrast throughout the joint surface without extravasation or obvious defect.  The patient had relief of symptoms during the anesthetic phase of the injection.  Procedure, treatment alternatives, risks and benefits explained, specific risks discussed. Consent was given by the patient. Immediately prior to procedure a time out was called to verify the correct patient, procedure, equipment, support staff and site/side marked as required. Patient was prepped and draped in the usual sterile fashion.      No  notes on file   Clinical History: No specialty comments available.  She reports that she has been smoking.  she has never used smokeless tobacco. No results for input(s): HGBA1C, LABURIC in the last 8760 hours.  Objective:  VS:  HT:    WT:   BMI:     BP:   HR: bpm  TEMP: ( )  RESP:  Physical Exam  Musculoskeletal:  Antalgic gait to the right.  Patient has pain with end range of motion with internal and external rotation.    Ortho Exam Imaging: Xr C-arm No Report  Result Date: 06/16/2017 Please see Notes or Procedures tab for imaging impression.   Past Medical/Family/Surgical/Social History: Medications & Allergies reviewed per EMR Patient Active Problem List   Diagnosis Date Noted  . Chest pain 01/14/2017  . AKI (acute kidney injury) ([place]) 01/14/2017  . Hypertension   . Hyperlipidemia   . Coronary artery disease   . Intractable nausea and vomiting 02/18/2015  . Lung nodule 02/18/2015  . RLQ abdominal pain 02/18/2015  . Hypothyroidism 02/18/2015  . Protein-calorie malnutrition, severe ([place]) 02/18/2015  . Nausea with vomiting   . CAD S/P OM1 DES 11/04/14 11/05/2014  . Dyslipidemia 11/05/2014  . Smoker 11/05/2014  . NSTEMI (non-ST elevated myocardial infarction) ([place]) 11/03/2014  . Postoperative hypothyroidism   . Elevated troponin 11/02/2014  . Neck pain    Past Medical History:  Diagnosis Date  . Coronary artery disease    a. 10/2014: NSTEMI with DES placed to OM1  . H. pylori infection  treated Sept 2016  . Hyperlipidemia   . Hypertension   . Hypothyroidism   . Nausea & vomiting   . Thyroid disease    Family History  Problem Relation Age of Onset  . Pulmonary embolism Mother   . Heart attack Mother   . CAD Father   . Diabetes Mellitus II Father   . Heart attack Father   . Hypertension Maternal Grandmother   . Stroke Maternal Grandmother   . Esophageal cancer Neg Hx   . Colon cancer Neg Hx   . Pancreatic cancer Neg Hx   . Stomach cancer Neg Hx     . Kidney disease Neg Hx   . Liver disease Neg Hx    Past Surgical History:  Procedure Laterality Date  . CARDIAC CATHETERIZATION N/A 11/04/2014   Procedure: Left Heart Cath and Coronary Angiography;  Surgeon: Jettie Booze, MD;  Location: [facility];  Service: Cardiovascular;  Laterality: N/A;  . CARDIAC CATHETERIZATION Right 11/04/2014   Procedure: Coronary Stent Intervention;  Surgeon: Jettie Booze, MD;  Location: [facility];  Service: Cardiovascular;  Laterality: Right;  . ESOPHAGOGASTRODUODENOSCOPY (EGD) WITH PROPOFOL N/A 02/19/2015   Procedure: ESOPHAGOGASTRODUODENOSCOPY (EGD) WITH PROPOFOL;  Surgeon: Manus Gunning, MD;  Location: [facility];  Service: Gastroenterology;  Laterality: N/A;  . THYROIDECTOMY     Social History   Occupational History  . Not on file  Tobacco Use  . Smoking status: Current Every Day Smoker  . Smokeless tobacco: Never Used  . Tobacco comment: form given 03/17/15  Substance and Sexual Activity  . Alcohol use: No    Alcohol/week: 0.0 oz  . Drug use: No  . Sexual activity: Not on file

## 2017-06-16 NOTE — Patient Instructions (Signed)

## 2017-07-10 ENCOUNTER — Ambulatory Visit (INDEPENDENT_AMBULATORY_CARE_PROVIDER_SITE_OTHER): Payer: Medicaid Other | Admitting: Orthopaedic Surgery

## 2018-12-17 ENCOUNTER — Telehealth: Payer: Self-pay | Admitting: Internal Medicine

## 2018-12-17 NOTE — Telephone Encounter (Signed)
New message:     Patient calling concerning that some one called her. The patient has moved to a another city and has another doctor. Please call patient if any questions.

## 2018-12-17 NOTE — Telephone Encounter (Signed)
Pt was last seen by Dr. Harrington Challenger in 10/2015. I have not tried to call her.   I do not see where anyone from this office tried to reach her.  It may have been from an old recall, which I just deleted.

## 2019-05-24 IMAGING — CR DG LUMBAR SPINE COMPLETE 4+V
6 series · 6 of 6 positions shown · non-contrast
Comparison: None.

CLINICAL DATA: Multiple falls, the most recent 1 week ago. Low back
pain radiating down right leg

EXAM:
LUMBAR SPINE - COMPLETE 4+ VIEW

[l-spine ap (1 of 2)]
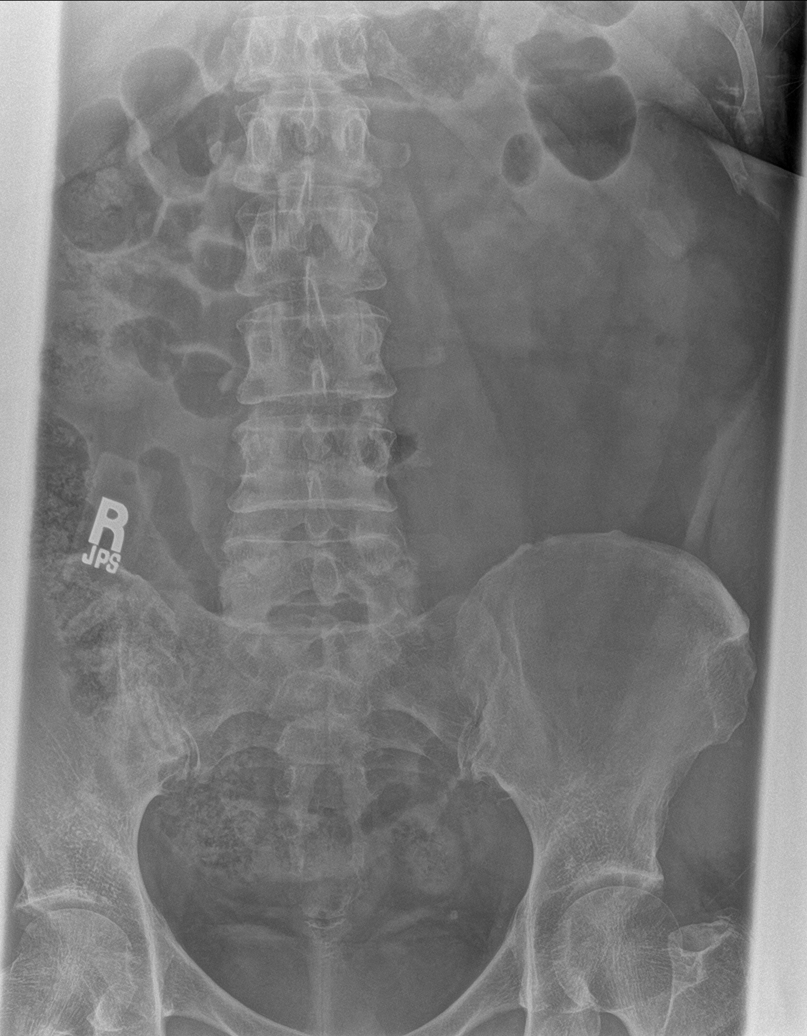

[l-spine obl (1 of 2)]
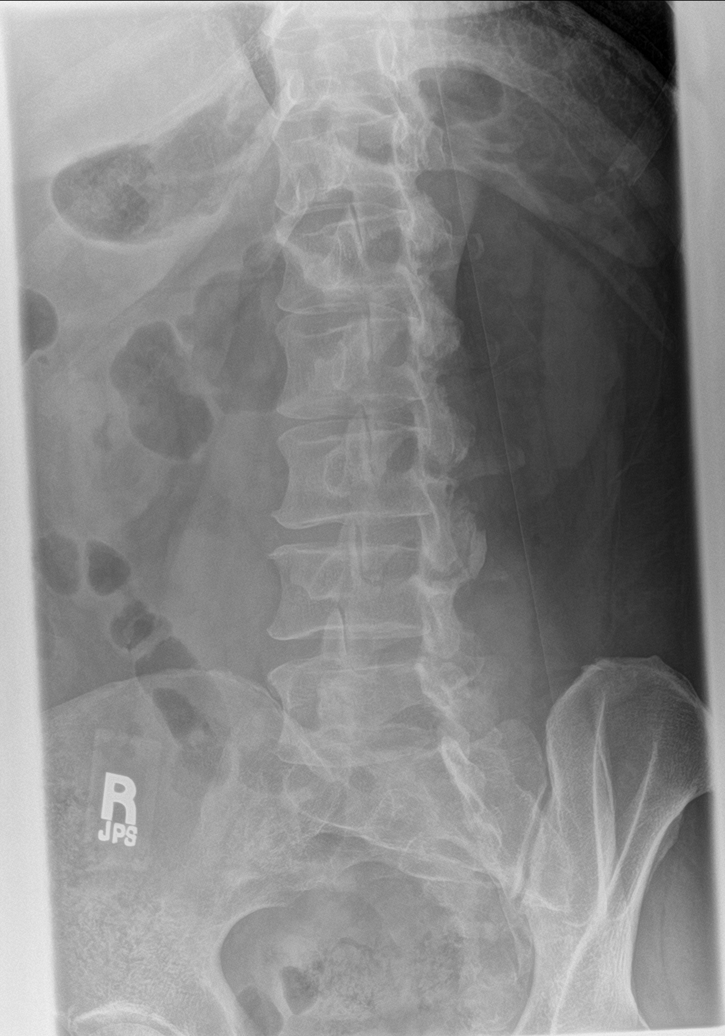

[l-spine obl (2 of 2)]
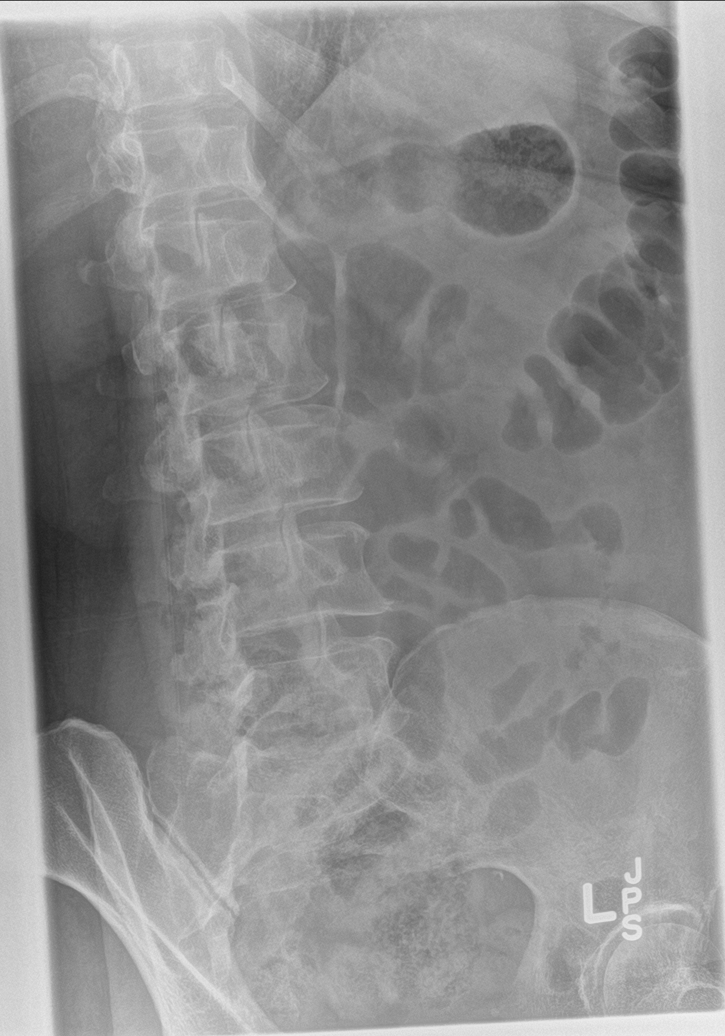

[l-spine lat]
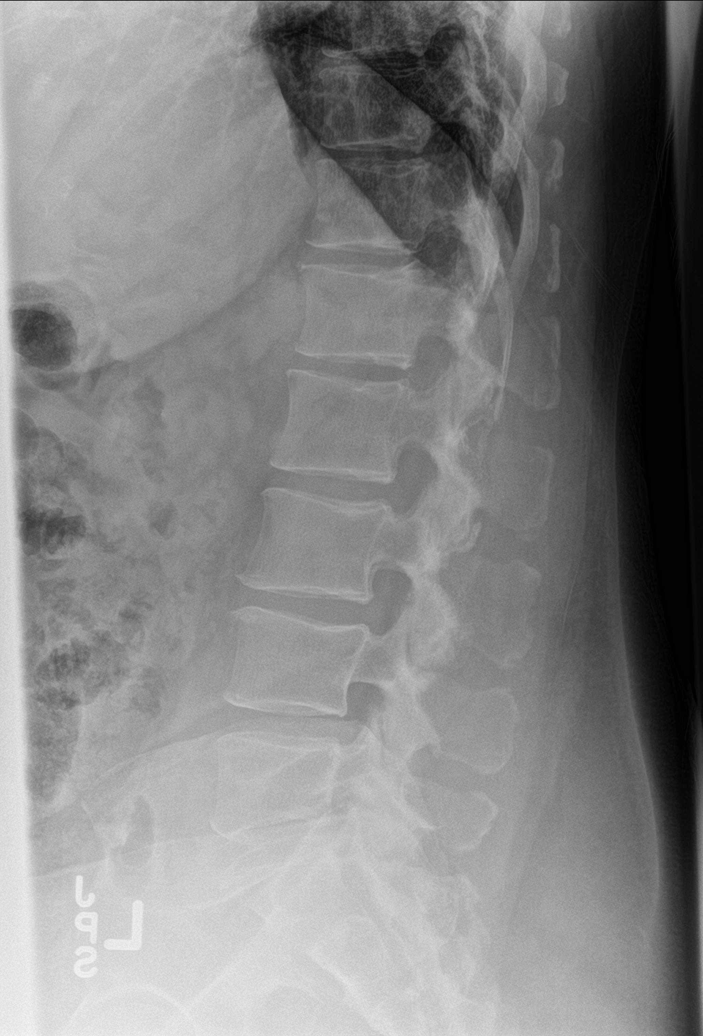

[l-spine spot]
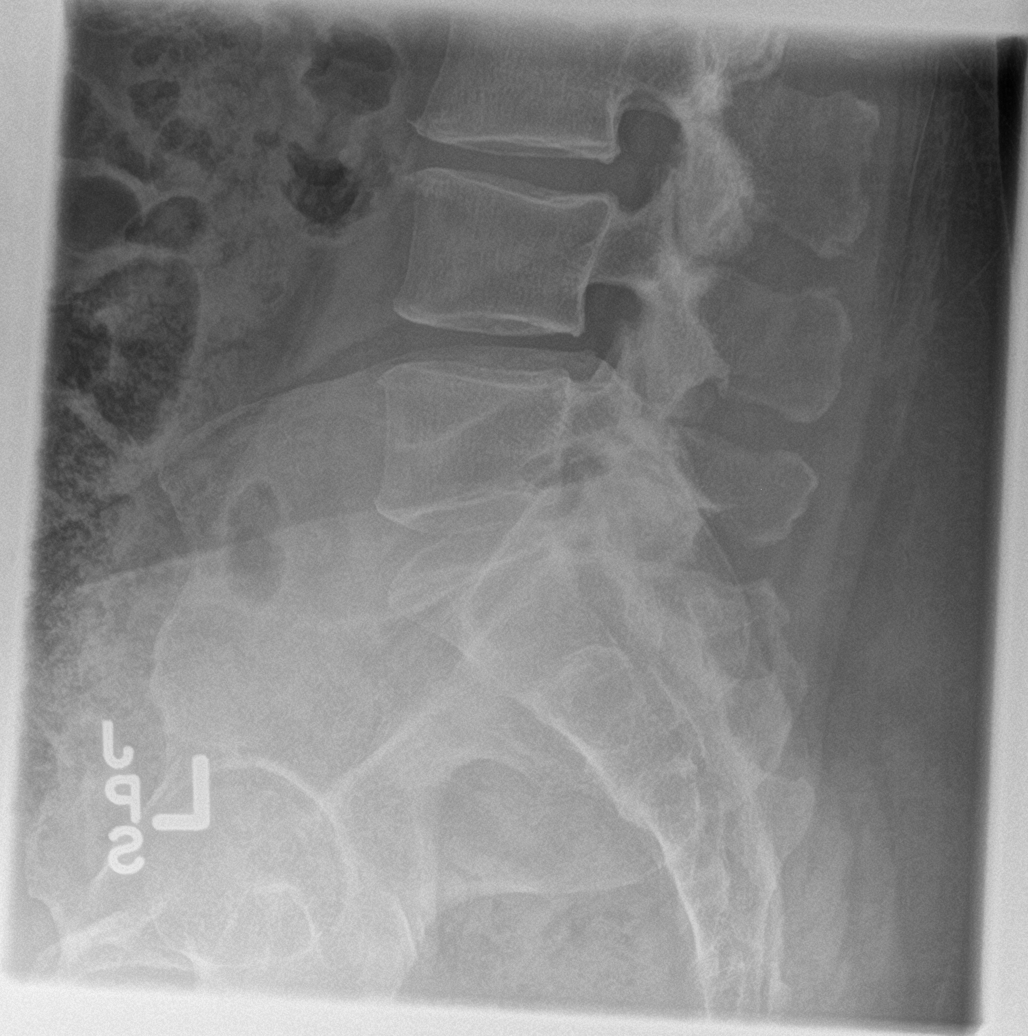

[l-spine ap (2 of 2)]
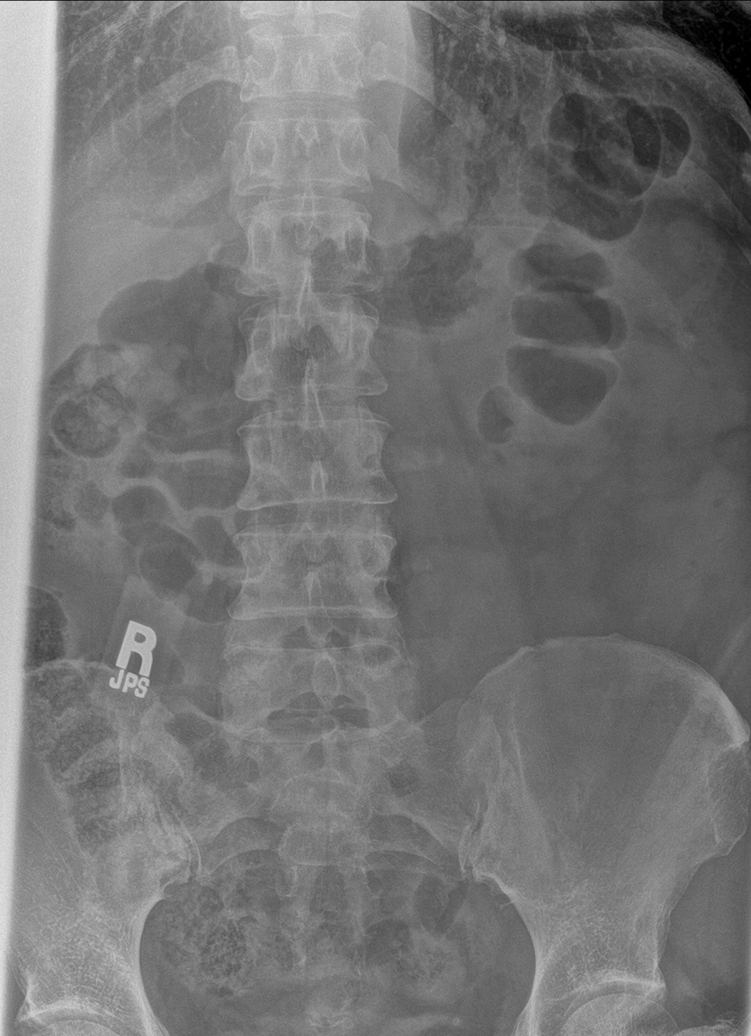

[6 of 6 positions shown; findings below may reference images not displayed]

FINDINGS: Normal alignment. No fracture. Degenerative facet disease throughout
the lumbar spine. SI joints are symmetric and unremarkable.
IMPRESSION: Degenerative facet disease.  No fracture or malalignment.
